# Patient Record
Sex: Female | Born: 1970 | Race: White | Hispanic: No | Smoking: Former smoker
Health system: Southern US, Community
[De-identification: ages and names within clinical notes are randomized; demographics above are authoritative.]

## PROBLEM LIST (undated history)

## (undated) DIAGNOSIS — R519 Headache, unspecified: Secondary | ICD-10-CM

## (undated) DIAGNOSIS — B182 Chronic viral hepatitis C: Secondary | ICD-10-CM

## (undated) DIAGNOSIS — E611 Iron deficiency: Secondary | ICD-10-CM

## (undated) DIAGNOSIS — Z9889 Other specified postprocedural states: Secondary | ICD-10-CM

## (undated) DIAGNOSIS — R51 Headache: Secondary | ICD-10-CM

## (undated) DIAGNOSIS — M1990 Unspecified osteoarthritis, unspecified site: Secondary | ICD-10-CM

## (undated) DIAGNOSIS — F329 Major depressive disorder, single episode, unspecified: Secondary | ICD-10-CM

## (undated) DIAGNOSIS — I10 Essential (primary) hypertension: Secondary | ICD-10-CM

## (undated) DIAGNOSIS — C50919 Malignant neoplasm of unspecified site of unspecified female breast: Secondary | ICD-10-CM

## (undated) DIAGNOSIS — K55069 Acute infarction of intestine, part and extent unspecified: Secondary | ICD-10-CM

## (undated) DIAGNOSIS — D250 Submucous leiomyoma of uterus: Principal | ICD-10-CM

## (undated) DIAGNOSIS — R112 Nausea with vomiting, unspecified: Secondary | ICD-10-CM

## (undated) DIAGNOSIS — Z87442 Personal history of urinary calculi: Secondary | ICD-10-CM

## (undated) DIAGNOSIS — G2581 Restless legs syndrome: Secondary | ICD-10-CM

## (undated) DIAGNOSIS — F419 Anxiety disorder, unspecified: Secondary | ICD-10-CM

## (undated) HISTORY — DX: Essential (primary) hypertension: I10

## (undated) HISTORY — DX: Other specified postprocedural states: Z98.890

## (undated) HISTORY — PX: BLADDER REPAIR: SHX76

## (undated) HISTORY — DX: Major depressive disorder, single episode, unspecified: F32.9

## (undated) HISTORY — PX: KIDNEY CYST REMOVAL: SHX684

## (undated) HISTORY — DX: Submucous leiomyoma of uterus: D25.0

## (undated) HISTORY — DX: Malignant neoplasm of unspecified site of unspecified female breast: C50.919

## (undated) HISTORY — DX: Chronic viral hepatitis C: B18.2

## (undated) HISTORY — PX: CARDIAC CATHETERIZATION: SHX172

## (undated) HISTORY — PX: ROTATOR CUFF REPAIR: SHX139

---
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
## 1996-05-27 HISTORY — PX: TUBAL LIGATION: SHX77

## 2011-05-28 DIAGNOSIS — F32A Depression, unspecified: Secondary | ICD-10-CM

## 2011-05-28 HISTORY — DX: Depression, unspecified: F32.A

## 2012-05-27 DIAGNOSIS — Z9889 Other specified postprocedural states: Secondary | ICD-10-CM

## 2012-05-27 HISTORY — DX: Other specified postprocedural states: Z98.890

## 2014-06-27 DIAGNOSIS — B182 Chronic viral hepatitis C: Secondary | ICD-10-CM

## 2014-06-27 HISTORY — DX: Chronic viral hepatitis C: B18.2

## 2015-06-29 ENCOUNTER — Ambulatory Visit (INDEPENDENT_AMBULATORY_CARE_PROVIDER_SITE_OTHER): Payer: Managed Care, Other (non HMO) | Admitting: Family Medicine

## 2015-06-29 ENCOUNTER — Encounter: Payer: Self-pay | Admitting: Family Medicine

## 2015-06-29 VITALS — BP 136/90 | HR 88 | Temp 98.1°F | Resp 20 | Ht 69.0 in | Wt 254.0 lb

## 2015-06-29 DIAGNOSIS — G2581 Restless legs syndrome: Secondary | ICD-10-CM

## 2015-06-29 DIAGNOSIS — G4710 Hypersomnia, unspecified: Secondary | ICD-10-CM | POA: Diagnosis not present

## 2015-06-29 DIAGNOSIS — I10 Essential (primary) hypertension: Secondary | ICD-10-CM

## 2015-06-29 DIAGNOSIS — G43419 Hemiplegic migraine, intractable, without status migrainosus: Secondary | ICD-10-CM

## 2015-06-29 DIAGNOSIS — F329 Major depressive disorder, single episode, unspecified: Secondary | ICD-10-CM | POA: Diagnosis not present

## 2015-06-29 DIAGNOSIS — F32A Depression, unspecified: Secondary | ICD-10-CM

## 2015-06-29 MED ORDER — ESCITALOPRAM OXALATE 10 MG PO TABS: 10.0000 mg | ORAL_TABLET | Freq: Every day | ORAL | Status: DC

## 2015-06-29 MED ORDER — ATENOLOL 100 MG PO TABS: 100.0000 mg | ORAL_TABLET | Freq: Every day | ORAL | Status: DC

## 2015-06-29 MED ORDER — GABAPENTIN 300 MG PO CAPS: 300.0000 mg | ORAL_CAPSULE | Freq: Three times a day (TID) | ORAL | Status: DC

## 2015-06-29 MED ORDER — IBUPROFEN 800 MG PO TABS: 800.0000 mg | ORAL_TABLET | Freq: Three times a day (TID) | ORAL | Status: DC | PRN

## 2015-06-29 MED ORDER — QUETIAPINE FUMARATE 50 MG PO TABS: 50.0000 mg | ORAL_TABLET | Freq: Every day | ORAL | Status: DC

## 2015-06-29 MED ORDER — LISINOPRIL 10 MG PO TABS: 10.0000 mg | ORAL_TABLET | Freq: Every day | ORAL | Status: DC

## 2015-06-29 NOTE — Progress Notes (Signed)
Subjective:    Patient ID: Susan Benton, female    DOB: November 05, 1970, 45 y.o.   MRN: [id number]  HPI Patient is a 45 year old white female here today to establish care. In the past, she suffered a back injury. At that exact same time she was dealing with the death of both of her grandparents her brother and one of her parents. 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 However she continues to battle depression. She also battles restless leg syndrome. Her primary care doctor in [place] start her on Soquel 300 mg at night. This has helped somewhat with restless leg but it did help with depression. Unfortunately she gained weight on the medication. She states that she has gained almost 50 pounds over the last few years. She also states that the Seroquel is not helping sufficiently with restless leg syndrome. She also reports that she snores excessively loud. It is to such an extent that she has to sleep in another room. Her husband has her stop sleeping at night. She reports excessive daytime sleepiness. She can fall asleep easily after eating a meal. She does not fall sleep in a car. She also reports severe daily headaches which she thought were migraines. She is concerned that she may have sleep apnea. Past medical history is also significant for hypertension and hepatitis C. She was recently treated for hepatitis C in [place]. She completed 8 weeks of Harvoni and is due for the eight-month follow-up recheck of viral load in March. Past Medical History  Diagnosis Date  . Hypertension   . Hep C w/o coma, chronic ([place]) 06/27/2014  . Hx of right and left heart catheterization 2014   Past Surgical History  Procedure Laterality Date  . Tubal ligation      2000  . Tonsilectomy, adenoidectomy, bilateral myringotomy and tubes Bilateral 1984   No current outpatient prescriptions on file prior to visit.   No current facility-administered  medications on file prior to visit.   No Known Allergies Social History   Social History  . Marital Status: Married    Spouse Name: N/A  . Number of Children: N/A  . Years of Education: N/A   Occupational History  . Not on file.   Social History Main Topics  . Smoking status: Current Every Day Smoker -- 0.50 packs/day  . Smokeless tobacco: Not on file  . Alcohol Use: Not on file  . Drug Use: Not on file  . Sexual Activity: Yes    Birth Control/ Protection: Surgical   Other Topics Concern  . Not on file   Social History Narrative  . No narrative on file      Review of Systems  All other systems reviewed and are negative.      Objective:   Physical Exam  Constitutional: She appears well-developed and well-nourished.  HENT:  Nose: Nose normal.  Mouth/Throat: Oropharynx is clear and moist. No oropharyngeal exudate.  Eyes: Conjunctivae are normal.  Neck: Neck supple. No thyromegaly present.  Cardiovascular: Normal rate, regular rhythm and normal heart sounds.   No murmur heard. Pulmonary/Chest: Effort normal and breath sounds normal. No respiratory distress. She has no wheezes. She has no rales.  Abdominal: Soft. Bowel sounds are normal. She exhibits no distension.  Lymphadenopathy:    She has no cervical adenopathy.  Vitals reviewed.         Assessment & Plan:  Benign essential HTN - Plan: lisinopril (PRINIVIL,ZESTRIL) 10  MG tablet, atenolol (TENORMIN) 100 MG tablet  Restless legs syndrome - Plan: gabapentin (NEURONTIN) 300 MG capsule  Depression - Plan: QUEtiapine (SEROQUEL) 50 MG tablet, escitalopram (LEXAPRO) 10 MG tablet  Intractable hemiplegic migraine without status migrainosus - Plan: ibuprofen (ADVIL,MOTRIN) 800 MG tablet  Due to the excessive weight gain, I would like to wean the patient down off Seroquel and gradually start her on Lexapro. Begin Lexapro 10 mg by mouth daily and decrease Seroquel to 50 mg by mouth daily at bedtime. She's only  been taking 150 mg by mouth daily at bedtime anyway for the last several months. Recheck here in March and if she is doing well discontinue Seroquel altogether. I will start her on gabapentin 300 mg by mouth every 8 hours when necessary restless leg syndrome symptoms. If gabapentin is insufficient, we can try Mirapex in March. I would like to see the patient for complete physical exam in March and perform her Pap smear and her mammogram at that time. I would also like to obtain fasting lab work including a CBC, CMP, fasting lipid panel in March. I will also check a hepatitis C viral load in March. Given her hypersomnolence and daily headache and excessive snoring, I will schedule the patient for a sleep study as I believe she may have sleep apnea as an explanation for her daily headache

## 2015-07-17 ENCOUNTER — Encounter: Payer: Self-pay | Admitting: Family Medicine

## 2015-07-17 DIAGNOSIS — F32A Depression, unspecified: Secondary | ICD-10-CM | POA: Insufficient documentation

## 2015-07-17 DIAGNOSIS — I10 Essential (primary) hypertension: Secondary | ICD-10-CM | POA: Insufficient documentation

## 2015-07-17 DIAGNOSIS — F329 Major depressive disorder, single episode, unspecified: Secondary | ICD-10-CM | POA: Insufficient documentation

## 2015-07-20 ENCOUNTER — Other Ambulatory Visit: Payer: Self-pay | Admitting: Family Medicine

## 2015-07-20 ENCOUNTER — Other Ambulatory Visit (HOSPITAL_COMMUNITY): Payer: Self-pay | Admitting: Respiratory Therapy

## 2015-07-20 ENCOUNTER — Other Ambulatory Visit: Payer: Managed Care, Other (non HMO)

## 2015-07-20 DIAGNOSIS — Z8619 Personal history of other infectious and parasitic diseases: Secondary | ICD-10-CM

## 2015-07-20 DIAGNOSIS — F329 Major depressive disorder, single episode, unspecified: Secondary | ICD-10-CM

## 2015-07-20 DIAGNOSIS — F32A Depression, unspecified: Secondary | ICD-10-CM

## 2015-07-20 DIAGNOSIS — G4710 Hypersomnia, unspecified: Secondary | ICD-10-CM

## 2015-07-20 DIAGNOSIS — Z79899 Other long term (current) drug therapy: Secondary | ICD-10-CM

## 2015-07-20 DIAGNOSIS — I10 Essential (primary) hypertension: Secondary | ICD-10-CM

## 2015-07-20 DIAGNOSIS — G43419 Hemiplegic migraine, intractable, without status migrainosus: Secondary | ICD-10-CM

## 2015-07-20 DIAGNOSIS — G2581 Restless legs syndrome: Secondary | ICD-10-CM

## 2015-07-20 LAB — COMPLETE METABOLIC PANEL WITH GFR
ALT: 16 U/L (ref 6–29)
AST: 16 U/L (ref 10–35)
Albumin: 4 g/dL (ref 3.6–5.1)
Alkaline Phosphatase: 63 U/L (ref 33–115)
BILIRUBIN TOTAL: 0.4 mg/dL (ref 0.2–1.2)
BUN: 14 mg/dL (ref 7–25)
CALCIUM: 8.9 mg/dL (ref 8.6–10.2)
CHLORIDE: 104 mmol/L (ref 98–110)
CO2: 24 mmol/L (ref 20–31)
CREATININE: 0.53 mg/dL (ref 0.50–1.10)
GFR, Est Non African American: >89 mL/min (ref >=60)
Glucose, Bld: 97 mg/dL (ref 70–99)
Potassium: 4.2 mmol/L (ref 3.5–5.3)
Sodium: 137 mmol/L (ref 135–146)
TOTAL PROTEIN: 6.9 g/dL (ref 6.1–8.1)

## 2015-07-20 LAB — CBC WITH DIFFERENTIAL/PLATELET
BASOS ABS: 0 10*3/uL (ref 0.0–0.1)
BASOS PCT: 0 % (ref 0–1)
EOS ABS: 0.3 10*3/uL (ref 0.0–0.7)
Eosinophils Relative: 3 % (ref 0–5)
HCT: 36.9 % (ref 36.0–46.0)
HEMOGLOBIN: 11.9 g/dL — AB (ref 12.0–15.0)
LYMPHS ABS: 1.8 10*3/uL (ref 0.7–4.0)
Lymphocytes Relative: 21 % (ref 12–46)
MCH: 25.1 pg — AB (ref 26.0–34.0)
MCHC: 32.2 g/dL (ref 30.0–36.0)
MCV: 77.8 fL — ABNORMAL LOW (ref 78.0–100.0)
MONOS PCT: 5 % (ref 3–12)
MPV: 10.1 fL (ref 8.6–12.4)
Monocytes Absolute: 0.4 10*3/uL (ref 0.1–1.0)
NEUTROS ABS: 6.1 10*3/uL (ref 1.7–7.7)
NEUTROS PCT: 71 % (ref 43–77)
PLATELETS: 327 10*3/uL (ref 150–400)
RBC: 4.74 MIL/uL (ref 3.87–5.11)
RDW: 16.4 % — ABNORMAL HIGH (ref 11.5–15.5)
WBC: 8.6 10*3/uL (ref 4.0–10.5)

## 2015-07-20 LAB — LIPID PANEL
CHOLESTEROL: 243 mg/dL — AB (ref 125–200)
HDL: 32 mg/dL — ABNORMAL LOW (ref >=46)
LDL CALC: 151 mg/dL — AB (ref <130)
TRIGLYCERIDES: 298 mg/dL — AB (ref <150)
Total CHOL/HDL Ratio: 7.6 Ratio — ABNORMAL HIGH (ref <=5.0)
VLDL: 60 mg/dL — AB (ref <30)

## 2015-07-26 LAB — HEPATITIS C VIRAL RNA NS3 GENOTYPE: HCV NS3 Subtype: NOT DETECTED

## 2015-08-31 ENCOUNTER — Ambulatory Visit: Payer: Managed Care, Other (non HMO) | Attending: Family Medicine | Admitting: Sleep Medicine

## 2015-08-31 DIAGNOSIS — Z79899 Other long term (current) drug therapy: Secondary | ICD-10-CM | POA: Diagnosis not present

## 2015-08-31 DIAGNOSIS — I10 Essential (primary) hypertension: Secondary | ICD-10-CM

## 2015-08-31 DIAGNOSIS — G43419 Hemiplegic migraine, intractable, without status migrainosus: Secondary | ICD-10-CM

## 2015-08-31 DIAGNOSIS — G2581 Restless legs syndrome: Secondary | ICD-10-CM

## 2015-08-31 DIAGNOSIS — G4710 Hypersomnia, unspecified: Secondary | ICD-10-CM | POA: Insufficient documentation

## 2015-08-31 DIAGNOSIS — F32A Depression, unspecified: Secondary | ICD-10-CM

## 2015-08-31 DIAGNOSIS — F329 Major depressive disorder, single episode, unspecified: Secondary | ICD-10-CM

## 2015-09-20 NOTE — Sleep Study (Signed)
[place] A. Merlene Laughter, MD     [email or web address]             NOCTURNAL POLYSOMNOGRAPHY   LOCATION: [place]   Demographics Edit Patient Name: Susan Benton, Susan Benton Date: 08/31/2015 Gender: Female D.O.B: [birth year] Age (years): 35 Referring Provider: Not Available Height (inches): 69 Interpreting Physician: Phillips Odor MD, ABSM Weight (lbs): 220 RPSGT: Peak, Robert BMI: 32 MRN: [id number] Neck Size: 17.00   CLINICAL INFORMATION EditMoveRemove this section Sleep Study Type: NPSG Indication for sleep study: Depression, Restless Sleep with Limb Movments Epworth Sleepiness Score: 10 SLEEP STUDY TECHNIQUE EditMoveRemove this section As per the AASM Manual for the Scoring of Sleep and Associated Events v2.3 (April 2016) with a hypopnea requiring 4% desaturations. The channels recorded and monitored were frontal, central and occipital EEG, electrooculogram (EOG), submentalis EMG (chin), nasal and oral airflow, thoracic and abdominal wall motion, anterior tibialis EMG, snore microphone, electrocardiogram, and pulse oximetry. MEDICATIONS EditMoveRemove this section Patient's medications include: N/A. Medications self-administered by patient during sleep study : No sleep medicine administered.   Current outpatient prescriptions:  .  atenolol (TENORMIN) 100 MG tablet, Take 1 tablet (100 mg total) by mouth daily., Disp: 30 tablet, Rfl: 5 .  escitalopram (LEXAPRO) 10 MG tablet, Take 1 tablet (10 mg total) by mouth daily., Disp: 30 tablet, Rfl: 5 .  gabapentin (NEURONTIN) 300 MG capsule, Take 1 capsule (300 mg total) by mouth 3 (three) times daily., Disp: 90 capsule, Rfl: 3 .  ibuprofen (ADVIL,MOTRIN) 800 MG tablet, Take 800 mg by mouth every 8 (eight) hours as needed., Disp: , Rfl:  .  ibuprofen (ADVIL,MOTRIN) 800 MG tablet, Take 1 tablet (800 mg total) by mouth every 8 (eight) hours as needed., Disp: 30 tablet, Rfl: 0 .  lisinopril (PRINIVIL,ZESTRIL) 10 MG tablet,  Take 1 tablet (10 mg total) by mouth daily., Disp: 30 tablet, Rfl: 5 .  QUEtiapine (SEROQUEL) 300 MG tablet, Take 300 mg by mouth at bedtime. Takes 150 mg poqhs, Disp: , Rfl:  .  QUEtiapine (SEROQUEL) 50 MG tablet, Take 1 tablet (50 mg total) by mouth at bedtime., Disp: 30 tablet, Rfl: 0  SLEEP ARCHITECTURE EditMoveRemove this section The study was initiated at 9:14:56 PM and ended at 3:19:20 AM. Sleep onset time was 23.9 minutes and the sleep efficiency was 92.2%. The total sleep time was 336.0 minutes. Stage REM latency was 209.0 minutes. The patient spent 2.23% of the night in stage N1 sleep, 84.23% in stage N2 sleep, 0.15% in stage N3 and 13.39% in REM. Alpha intrusion was absent. Supine sleep was 68.75%. RESPIRATORY PARAMETERS EditMoveRemove this section The overall apnea/hypopnea index (AHI) was 2.1 per hour. There were 0 total apneas, including 0 obstructive, 0 central and 0 mixed apneas. There were 12 hypopneas and 12 RERAs. The AHI during Stage REM sleep was 2.7 per hour. AHI while supine was 2.9 per hour. The mean oxygen saturation was 94.40%. The minimum SpO2 during sleep was 89.00%. Loud snoring was noted during this study. CARDIAC DATA EditMoveRemove this section The 2 lead EKG demonstrated sinus rhythm. The mean heart rate was 67.04 beats per minute. Other EKG findings include: PVCs. LEG MOVEMENT DATA EditMoveRemove this section The total PLMS were 223 with a resulting PLMS index of 39.82. Associated arousal with leg movement index was 3.7.    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 No significant associated arousals. No significant obstructive sleep apnea occurred during this study. No significant central sleep apnea  occurred during this study. Reduced slow wave sleep.     Delano Metz, MD Diplomate, American Board of Sleep Medicine.

## 2015-09-26 ENCOUNTER — Encounter (INDEPENDENT_AMBULATORY_CARE_PROVIDER_SITE_OTHER): Payer: Self-pay

## 2015-09-26 ENCOUNTER — Encounter: Payer: Self-pay | Admitting: Family Medicine

## 2015-09-26 ENCOUNTER — Ambulatory Visit (INDEPENDENT_AMBULATORY_CARE_PROVIDER_SITE_OTHER): Payer: Managed Care, Other (non HMO) | Admitting: Family Medicine

## 2015-09-26 VITALS — BP 152/100 | HR 86 | Temp 97.7°F | Resp 18 | Ht 69.0 in | Wt 258.0 lb

## 2015-09-26 DIAGNOSIS — G2581 Restless legs syndrome: Secondary | ICD-10-CM | POA: Diagnosis not present

## 2015-09-26 MED ORDER — ROPINIROLE HCL 0.5 MG PO TABS: 1.0000 mg | ORAL_TABLET | Freq: Every day | ORAL | Status: DC

## 2015-09-26 MED ORDER — AMOXICILLIN 875 MG PO TABS: 875.0000 mg | ORAL_TABLET | Freq: Two times a day (BID) | ORAL | Status: DC

## 2015-09-26 NOTE — Progress Notes (Signed)
Subjective:    Patient ID: Susan Benton, female    DOB: Sep 28, 1970, 45 y.o.   MRN: [id number]  HPI 06/29/15 Patient is a 45 year old white female here today to establish care. In the past, she suffered a back injury. At that exact same time she was dealing with the death of both of her grandparents her brother and one of her parents. 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 However she continues to battle depression. She also battles restless leg syndrome. Her primary care doctor in [place] start her on Soquel 300 mg at night. This has helped somewhat with restless leg but it did help with depression. Unfortunately she gained weight on the medication. She states that she has gained almost 50 pounds over the last few years. She also states that the Seroquel is not helping sufficiently with restless leg syndrome. She also reports that she snores excessively loud. It is to such an extent that she has to sleep in another room. Her husband has her stop sleeping at night. She reports excessive daytime sleepiness. She can fall asleep easily after eating a meal. She does not fall sleep in a car. She also reports severe daily headaches which she thought were migraines. She is concerned that she may have sleep apnea. Past medical history is also significant for hypertension and hepatitis C. She was recently treated for hepatitis C in [place]. She completed 8 weeks of Harvoni and is due for the eight-month follow-up recheck of viral load in March.  At that time, my plan was: Due to the excessive weight gain, I would like to wean the patient down off Seroquel and gradually start her on Lexapro. Begin Lexapro 10 mg by mouth daily and decrease Seroquel to 50 mg by mouth daily at bedtime. She's only been taking 150 mg by mouth daily at bedtime anyway for the last several months. Recheck here in March and if she is doing well discontinue Seroquel  altogether. I will start her on gabapentin 300 mg by mouth every 8 hours when necessary restless leg syndrome symptoms. If gabapentin is insufficient, we can try Mirapex in March. I would like to see the patient for complete physical exam in March and perform her Pap smear and her mammogram at that time. I would also like to obtain fasting lab work including a CBC, CMP, fasting lipid panel in March. I will also check a hepatitis C viral load in March. Given her hypersomnolence and daily headache and excessive snoring, I will schedule the patient for a sleep study as I believe she may have sleep apnea as an explanation for her daily headache  09/26/15 Patient is now off Seroquel. She is taking gabapentin which deathly helped her go to sleep but she continues to experience restless leg syndrome. In fact, during her sleep study, she was found to kick and flail her legs more than 100 times. This possibly is causing her fatigue and her lack of energy rather than sleep apnea. The gabapentin has not helped with this. She also complains of a sore throat no rhinorrhea, no cough. On examination today she has marked erythema in the posterior oropharynx with tender right cervical lymphadenopathy and no evidence of any viral symptoms. Past Medical History  Diagnosis Date  . Hep C w/o coma, chronic ([place]) 06/27/2014  . Hx of right and left heart catheterization 2014  . Depression 2013  . Hypertension 1987   Past Surgical History  Procedure Laterality Date  . Tonsilectomy, adenoidectomy, bilateral myringotomy and tubes Bilateral 1984  . Tubal ligation  1998    2000   Current Outpatient Prescriptions on File Prior to Visit  Medication Sig Dispense Refill  . atenolol (TENORMIN) 100 MG tablet Take 1 tablet (100 mg total) by mouth daily. 30 tablet 5  . escitalopram (LEXAPRO) 10 MG tablet Take 1 tablet (10 mg total) by mouth daily. 30 tablet 5  . gabapentin (NEURONTIN) 300 MG capsule Take 1 capsule (300 mg total) by  mouth 3 (three) times daily. 90 capsule 3  . ibuprofen (ADVIL,MOTRIN) 800 MG tablet Take 800 mg by mouth every 8 (eight) hours as needed.    Marland Kitchen ibuprofen (ADVIL,MOTRIN) 800 MG tablet Take 1 tablet (800 mg total) by mouth every 8 (eight) hours as needed. 30 tablet 0  . lisinopril (PRINIVIL,ZESTRIL) 10 MG tablet Take 1 tablet (10 mg total) by mouth daily. 30 tablet 5  . QUEtiapine (SEROQUEL) 300 MG tablet Take 300 mg by mouth at bedtime. Takes 150 mg poqhs    . QUEtiapine (SEROQUEL) 50 MG tablet Take 1 tablet (50 mg total) by mouth at bedtime. 30 tablet 0   No current facility-administered medications on file prior to visit.   No Known Allergies Social History   Social History  . Marital Status: Married    Spouse Name: N/A  . Number of Children: N/A  . Years of Education: N/A   Occupational History  . Not on file.   Social History Main Topics  . Smoking status: Current Every Day Smoker -- 0.50 packs/day    Types: Cigarettes  . Smokeless tobacco: Not on file  . Alcohol Use: No  . Drug Use: No  . Sexual Activity: Yes    Birth Control/ Protection: Surgical   Other Topics Concern  . Not on file   Social History Narrative      Review of Systems  All other systems reviewed and are negative.      Objective:   Physical Exam  Constitutional: She appears well-developed and well-nourished.  HENT:  Nose: Nose normal.  Mouth/Throat: Posterior oropharyngeal erythema present. No oropharyngeal exudate.  Eyes: Conjunctivae are normal.  Neck: Neck supple. No thyromegaly present.  Cardiovascular: Normal rate, regular rhythm and normal heart sounds.   No murmur heard. Pulmonary/Chest: Effort normal and breath sounds normal. No respiratory distress. She has no wheezes. She has no rales.  Abdominal: Soft. Bowel sounds are normal. She exhibits no distension.  Lymphadenopathy:    She has cervical adenopathy.  Vitals reviewed.         Assessment & Plan:  RLS Discontinue  gabapentin and replace with Requip 0.5 mg by mouth daily at bedtime. Increase to 1 mg by mouth daily at bedtime in one week. I would double up to 2 mg at night in 2 weeks depending upon her response. Her throat looks like strep throat as does her symptoms given the lack of viral symptoms such as rhinorrhea and cough sneezing etc. The erythema is impressive as well as the lymphadenopathy area and begin amoxicillin 850 mg by mouth twice a day for 10 days

## 2015-10-25 ENCOUNTER — Encounter: Payer: Self-pay | Admitting: Family Medicine

## 2015-12-22 ENCOUNTER — Encounter: Payer: Self-pay | Admitting: Family Medicine

## 2015-12-22 ENCOUNTER — Ambulatory Visit (INDEPENDENT_AMBULATORY_CARE_PROVIDER_SITE_OTHER): Payer: Managed Care, Other (non HMO) | Admitting: Family Medicine

## 2015-12-22 VITALS — BP 140/90 | HR 100 | Temp 98.3°F | Resp 20 | Ht 69.0 in | Wt 251.0 lb

## 2015-12-22 DIAGNOSIS — J209 Acute bronchitis, unspecified: Secondary | ICD-10-CM | POA: Diagnosis not present

## 2015-12-22 MED ORDER — AZITHROMYCIN 250 MG PO TABS: ORAL_TABLET | ORAL | 0 refills | Status: DC

## 2015-12-22 MED ORDER — PREDNISONE 20 MG PO TABS: ORAL_TABLET | ORAL | 0 refills | Status: DC

## 2015-12-22 MED ORDER — ALBUTEROL SULFATE HFA 108 (90 BASE) MCG/ACT IN AERS: 2.0000 | INHALATION_SPRAY | Freq: Once | RESPIRATORY_TRACT | Status: DC

## 2015-12-22 NOTE — Progress Notes (Signed)
   Subjective:    Patient ID: Susan Benton, female    DOB: Dec 22, 1970, 45 y.o.   MRN: [id number]  HPI Patient has had a severe cough productive of brown sputum for 4 days. She reports subjective fevers and chills. On examination today she is audibly wheezing bilaterally with rhonchorous breath sounds. She reports tightness in her chest and chest congestion. She continues to smoke. Past Medical History:  Diagnosis Date  . Depression 2013  . Hep C w/o coma, chronic ([place]) 06/27/2014  . Hx of right and left heart catheterization 2014  . Hypertension 1987   Past Surgical History:  Procedure Laterality Date  . TONSILECTOMY, ADENOIDECTOMY, BILATERAL MYRINGOTOMY AND TUBES Bilateral 1984  . TUBAL LIGATION  1998   2000   Current Outpatient Prescriptions on File Prior to Visit  Medication Sig Dispense Refill  . atenolol (TENORMIN) 100 MG tablet Take 1 tablet (100 mg total) by mouth daily. 30 tablet 5  . escitalopram (LEXAPRO) 10 MG tablet Take 1 tablet (10 mg total) by mouth daily. 30 tablet 5  . gabapentin (NEURONTIN) 300 MG capsule Take 1 capsule (300 mg total) by mouth 3 (three) times daily. 90 capsule 3  . ibuprofen (ADVIL,MOTRIN) 800 MG tablet Take 1 tablet (800 mg total) by mouth every 8 (eight) hours as needed. 30 tablet 0  . lisinopril (PRINIVIL,ZESTRIL) 10 MG tablet Take 1 tablet (10 mg total) by mouth daily. 30 tablet 5  . rOPINIRole (REQUIP) 0.5 MG tablet Take 2 tablets (1 mg total) by mouth at bedtime. 60 tablet 5   No current facility-administered medications on file prior to visit.    No Known Allergies Social History   Social History  . Marital status: Married    Spouse name: N/A  . Number of children: N/A  . Years of education: N/A   Occupational History  . Not on file.   Social History Main Topics  . Smoking status: Current Every Day Smoker    Packs/day: 0.50    Types: Cigarettes  . Smokeless tobacco: Not on file  . Alcohol use No  . Drug use: No  . Sexual  activity: Yes    Birth control/ protection: Surgical   Other Topics Concern  . Not on file   Social History Narrative  . No narrative on file      Review of Systems  All other systems reviewed and are negative.      Objective:   Physical Exam  HENT:  Right Ear: External ear normal.  Left Ear: External ear normal.  Nose: Nose normal.  Mouth/Throat: Oropharynx is clear and moist.  Eyes: Conjunctivae are normal.  Neck: Neck supple.  Cardiovascular: Normal rate, regular rhythm and normal heart sounds.   Pulmonary/Chest: Effort normal. She has wheezes. She has rales.  Lymphadenopathy:    She has no cervical adenopathy.  Vitals reviewed.         Assessment & Plan:  Acute bronchitis, unspecified organism - Plan: azithromycin (ZITHROMAX) 250 MG tablet, albuterol (PROVENTIL HFA;VENTOLIN HFA) 108 (90 Base) MCG/ACT inhaler 2 puff, predniSONE (DELTASONE) 20 MG tablet  Albuterol 2 puffs inhaled every 6 hours as needed. Begin Z-Pak. Start prednisone taper pack. Encourage smoking cessation. She may have underlying mild COPD as evidenced by her frequent steroid dependent episodes of bronchitis

## 2016-01-03 ENCOUNTER — Encounter: Payer: Self-pay | Admitting: Family Medicine

## 2016-02-09 ENCOUNTER — Encounter: Payer: Self-pay | Admitting: Family Medicine

## 2016-02-14 ENCOUNTER — Other Ambulatory Visit: Payer: Self-pay | Admitting: Family Medicine

## 2016-02-14 DIAGNOSIS — I10 Essential (primary) hypertension: Secondary | ICD-10-CM

## 2016-02-17 ENCOUNTER — Other Ambulatory Visit: Payer: Self-pay | Admitting: Family Medicine

## 2016-02-17 DIAGNOSIS — I10 Essential (primary) hypertension: Secondary | ICD-10-CM

## 2016-02-17 MED ORDER — ATENOLOL 100 MG PO TABS: 100.0000 mg | ORAL_TABLET | Freq: Every day | ORAL | 5 refills | Status: DC

## 2016-02-17 NOTE — Telephone Encounter (Signed)
Medication called/sent to requested pharmacy  

## 2016-02-19 ENCOUNTER — Telehealth: Payer: Self-pay | Admitting: Family Medicine

## 2016-02-19 MED ORDER — METOPROLOL SUCCINATE ER 100 MG PO TB24: 100.0000 mg | ORAL_TABLET | Freq: Every day | ORAL | 3 refills | Status: DC

## 2016-02-19 NOTE — Telephone Encounter (Signed)
Atenolol is on manufacturer back order and would like to switch to something else.   Per [facility] change to Metoprolol XL 100mg    Rx sent to pharm

## 2016-02-29 ENCOUNTER — Encounter: Payer: Self-pay | Admitting: Family Medicine

## 2016-03-06 ENCOUNTER — Other Ambulatory Visit: Payer: Self-pay | Admitting: Family Medicine

## 2016-03-06 DIAGNOSIS — I10 Essential (primary) hypertension: Secondary | ICD-10-CM

## 2016-03-06 DIAGNOSIS — F32A Depression, unspecified: Secondary | ICD-10-CM

## 2016-03-06 DIAGNOSIS — F329 Major depressive disorder, single episode, unspecified: Secondary | ICD-10-CM

## 2016-03-10 ENCOUNTER — Other Ambulatory Visit: Payer: Self-pay | Admitting: Family Medicine

## 2016-03-10 DIAGNOSIS — F32A Depression, unspecified: Secondary | ICD-10-CM

## 2016-03-10 DIAGNOSIS — F329 Major depressive disorder, single episode, unspecified: Secondary | ICD-10-CM

## 2016-03-11 ENCOUNTER — Telehealth: Payer: Self-pay | Admitting: Family Medicine

## 2016-03-11 NOTE — Telephone Encounter (Signed)
Patient is calling to say that she was here recently and dr pickard prescribed her prednisone for her bronchitis, he said when she was off of that he could prescribe her phentermine she would like this done now if possible   [phone]

## 2016-03-12 MED ORDER — PHENTERMINE HCL 37.5 MG PO TABS
37.5000 mg | ORAL_TABLET | Freq: Every day | ORAL | 0 refills | Status: DC
Start: 2016-03-12 — End: 2016-09-25

## 2016-03-12 NOTE — Telephone Encounter (Signed)
Medication called/sent to requested pharmacy and pt will call back to schedule appt when she gets her schedule.

## 2016-03-12 NOTE — Telephone Encounter (Signed)
We can try adipex 37.5 mg poqam for weight loos (30) tabs, no refill, recheck in 74month at ov to monitor bp and ensure that it does not cause any mania symptoms as a stimulant.

## 2016-03-25 ENCOUNTER — Ambulatory Visit (INDEPENDENT_AMBULATORY_CARE_PROVIDER_SITE_OTHER): Payer: Managed Care, Other (non HMO) | Admitting: Family Medicine

## 2016-03-25 ENCOUNTER — Encounter: Payer: Self-pay | Admitting: Family Medicine

## 2016-03-25 VITALS — BP 140/98 | HR 78 | Temp 98.3°F | Resp 16 | Ht 69.0 in | Wt 254.0 lb

## 2016-03-25 DIAGNOSIS — R350 Frequency of micturition: Secondary | ICD-10-CM

## 2016-03-25 LAB — URINALYSIS, ROUTINE W REFLEX MICROSCOPIC
Bilirubin Urine: NEGATIVE
Glucose, UA: NEGATIVE
KETONES UR: NEGATIVE
LEUKOCYTES UA: NEGATIVE
NITRITE: POSITIVE — AB
PH: 5.5 (ref 5.0–8.0)
Protein, ur: NEGATIVE
SPECIFIC GRAVITY, URINE: 1.02 (ref 1.001–1.035)

## 2016-03-25 LAB — URINALYSIS, MICROSCOPIC ONLY
BACTERIA UA: NONE SEEN [HPF]
CASTS: NONE SEEN [LPF]
CRYSTALS: NONE SEEN [HPF]
Yeast: NONE SEEN [HPF]

## 2016-03-25 MED ORDER — SULFAMETHOXAZOLE-TRIMETHOPRIM 800-160 MG PO TABS: 1.0000 | ORAL_TABLET | Freq: Two times a day (BID) | ORAL | 0 refills | Status: DC

## 2016-03-25 MED ORDER — FLUCONAZOLE 150 MG PO TABS: 150.0000 mg | ORAL_TABLET | Freq: Once | ORAL | 0 refills | Status: AC

## 2016-03-25 NOTE — Progress Notes (Signed)
   Subjective:    Patient ID: Susan Benton, female    DOB: 1970-10-28, 45 y.o.   MRN: [id number]  HPI Patient presents with 2 days of polyuria 8-10 episodes per day, trace hematuria, urgency, frequency, bladder spasms, and some mild dysuria. She is on Pyridium explaining some of the color changes seen on her urinalysis this morning Past Medical History:  Diagnosis Date  . Depression 2013  . Hep C w/o coma, chronic ([place]) 06/27/2014  . Hx of right and left heart catheterization 2014  . Hypertension 1987   Past Surgical History:  Procedure Laterality Date  . TONSILECTOMY, ADENOIDECTOMY, BILATERAL MYRINGOTOMY AND TUBES Bilateral 1984  . [place]   Current Outpatient Prescriptions on File Prior to Visit  Medication Sig Dispense Refill  . escitalopram (LEXAPRO) 10 MG tablet TAKE 1 TABLET(10 MG) BY MOUTH DAILY 30 tablet 11  . gabapentin (NEURONTIN) 300 MG capsule Take 1 capsule (300 mg total) by mouth 3 (three) times daily. 90 capsule 3  . ibuprofen (ADVIL,MOTRIN) 800 MG tablet Take 1 tablet (800 mg total) by mouth every 8 (eight) hours as needed. 30 tablet 0  . lisinopril (PRINIVIL,ZESTRIL) 10 MG tablet TAKE 1 TABLET(10 MG) BY MOUTH DAILY 30 tablet 0  . metoprolol succinate (TOPROL-XL) 100 MG 24 hr tablet Take 1 tablet (100 mg total) by mouth daily. Take with or immediately following a meal. 90 tablet 3  . phentermine (ADIPEX-P) 37.5 MG tablet Take 1 tablet (37.5 mg total) by mouth daily before breakfast. 30 tablet 0  . rOPINIRole (REQUIP) 0.5 MG tablet Take 2 tablets (1 mg total) by mouth at bedtime. 60 tablet 5   Current Facility-Administered Medications on File Prior to Visit  Medication Dose Route Frequency Provider Last Rate Last Dose  . albuterol (PROVENTIL HFA;VENTOLIN HFA) 108 (90 Base) MCG/ACT inhaler 2 puff  2 puff Inhalation Once Susy Frizzle, MD       No Known Allergies Social History   Social History  . Marital status: Married    Spouse name:  N/A  . Number of children: N/A  . Years of education: N/A   Occupational History  . Not on file.   Social History Main Topics  . Smoking status: Current Every Day Smoker    Packs/day: 0.50    Types: Cigarettes  . Smokeless tobacco: Not on file  . Alcohol use No  . Drug use: No  . Sexual activity: Yes    Birth control/ protection: Surgical   Other Topics Concern  . Not on file   Social History Narrative  . No narrative on file      Review of Systems  All other systems reviewed and are negative.      Objective:   Physical Exam  Cardiovascular: Normal rate, regular rhythm and normal heart sounds.   Pulmonary/Chest: Effort normal and breath sounds normal.  Vitals reviewed.         Assessment & Plan:  Frequent urination - Plan: Urinalysis, Routine w reflex microscopic (not at [facility]), fluconazole (DIFLUCAN) 150 MG tablet  Begin Bactrim 1 tablet by mouth twice a day for 3 days. Also gave her a prescription of Diflucan in case she develops a yeast infection afterwards. We also recommended rechecking her cholesterol in January after she completes 3 months of Adipex. We also discussed Saxenda.

## 2016-04-26 ENCOUNTER — Other Ambulatory Visit: Payer: Self-pay | Admitting: Family Medicine

## 2016-04-30 ENCOUNTER — Other Ambulatory Visit: Payer: Self-pay | Admitting: Family Medicine

## 2016-04-30 DIAGNOSIS — I10 Essential (primary) hypertension: Secondary | ICD-10-CM

## 2016-04-30 DIAGNOSIS — G2581 Restless legs syndrome: Secondary | ICD-10-CM

## 2016-05-23 ENCOUNTER — Other Ambulatory Visit: Payer: Self-pay | Admitting: Family Medicine

## 2016-06-17 ENCOUNTER — Ambulatory Visit: Payer: Managed Care, Other (non HMO) | Admitting: Family Medicine

## 2016-07-10 ENCOUNTER — Other Ambulatory Visit: Payer: Self-pay | Admitting: Family Medicine

## 2016-07-10 DIAGNOSIS — I10 Essential (primary) hypertension: Secondary | ICD-10-CM

## 2016-07-11 ENCOUNTER — Other Ambulatory Visit: Payer: Self-pay | Admitting: Family Medicine

## 2016-07-11 DIAGNOSIS — I10 Essential (primary) hypertension: Secondary | ICD-10-CM

## 2016-07-11 MED ORDER — ATENOLOL 100 MG PO TABS: ORAL_TABLET | ORAL | 3 refills | Status: DC

## 2016-07-11 MED ORDER — ROPINIROLE HCL 2 MG PO TABS: ORAL_TABLET | ORAL | 3 refills | Status: AC

## 2016-08-06 ENCOUNTER — Encounter: Payer: Self-pay | Admitting: Family Medicine

## 2016-09-25 ENCOUNTER — Encounter: Payer: Self-pay | Admitting: Adult Health

## 2016-09-25 ENCOUNTER — Ambulatory Visit (INDEPENDENT_AMBULATORY_CARE_PROVIDER_SITE_OTHER): Payer: BLUE CROSS/BLUE SHIELD | Admitting: Adult Health

## 2016-09-25 ENCOUNTER — Other Ambulatory Visit (HOSPITAL_COMMUNITY)
Admission: RE | Admit: 2016-09-25 | Discharge: 2016-09-25 | Disposition: A | Discharge status: Home / Self Care | Payer: BLUE CROSS/BLUE SHIELD | Source: Ambulatory Visit | Attending: Adult Health | Admitting: Adult Health

## 2016-09-25 VITALS — BP 122/80 | HR 79 | Ht 69.0 in | Wt 258.0 lb

## 2016-09-25 DIAGNOSIS — N946 Dysmenorrhea, unspecified: Secondary | ICD-10-CM | POA: Insufficient documentation

## 2016-09-25 DIAGNOSIS — Z1212 Encounter for screening for malignant neoplasm of rectum: Secondary | ICD-10-CM | POA: Diagnosis not present

## 2016-09-25 DIAGNOSIS — Z1211 Encounter for screening for malignant neoplasm of colon: Secondary | ICD-10-CM

## 2016-09-25 DIAGNOSIS — Z01419 Encounter for gynecological examination (general) (routine) without abnormal findings: Secondary | ICD-10-CM | POA: Insufficient documentation

## 2016-09-25 DIAGNOSIS — N6311 Unspecified lump in the right breast, upper outer quadrant: Secondary | ICD-10-CM

## 2016-09-25 DIAGNOSIS — N920 Excessive and frequent menstruation with regular cycle: Secondary | ICD-10-CM

## 2016-09-25 LAB — HEMOCCULT GUIAC POC 1CARD (OFFICE): FECAL OCCULT BLD: NEGATIVE

## 2016-09-25 NOTE — Progress Notes (Signed)
Patient ID: Susan Benton, female   DOB: November 02, 1970, 46 y.o.   MRN: [id number] History of Present Illness: Susan Benton is a 46 year old white female, in for well woman gyn exam and pap, it has been years since last pap and gyn exam.She complains of lump in right breast and burning under nipple at times(has history of fibroadenoma, she says),and heavy periods.She says she bleeds 7 days and 4 are heavy, changes tampons and pad every 2 hours and has some cramps and lots of clots.She is having shoulder surgey in near future. She is Therapist, sports at [employer]. PCP is Dr Dennard Schaumann.   Current Medications, Allergies, Past Medical History, Past Surgical History, Family History and Social History were reviewed in [employer] record.     Review of Systems: Patient denies any headaches, hearing loss, fatigue, blurred vision, shortness of breath, chest pain, abdominal pain, problems with bowel movements, urination, or intercourse. No joint pain or mood swings. See HPI for positives.    Physical Exam:BP 122/80 (BP Location: Right Arm, Patient Position: Sitting, Cuff Size: Normal)   Pulse 79   Ht 5\' 9"  (1.753 m)   Wt 258 lb (117 kg)   LMP 07/31/2016 (Exact Date)   BMI 38.10 kg/m  General:  Well developed, well nourished, no acute distress Skin:  Warm and dry Neck:  Midline trachea, normal thyroid, good ROM, no lymphadenopathy Lungs; Clear to auscultation bilaterally Breast:  No dominant palpable mass, retraction, or nipple discharge or left, on right there is a 3 cm nodule at 11 0'clock in areola, non tender (last mammogram over 10 years ago in [place]). Cardiovascular: Regular rate and rhythm Abdomen:  Soft, non tender, no hepatosplenomegaly Pelvic:  External genitalia is normal in appearance, no lesions.  The vagina is normal in appearance. Urethra has no lesions or masses. The cervix is bulbous.Pap with HPV performed.   Uterus is felt to be normal shape, and contour,but enlarged to  about 12-14 weeks.  No adnexal masses or tenderness noted.Bladder is non tender, no masses felt. Rectal: Good sphincter tone, no polyps, or hemorrhoids felt.  Hemoccult negative. Extremities/musculoskeletal:  No swelling or varicosities noted, no clubbing or cyanosis Psych:  No mood changes, alert and cooperative,seems happy PHQ 9 score 14, she is on meds and denies any suicidal ideations.   Impression: 1. Encounter for routine gynecological examination with Papanicolaou smear of cervix   2. Menorrhagia with regular cycle   3. Menstrual cramps   4. Mass of upper outer quadrant of right breast   5. Screening for colorectal cancer      Plan: Diagnostic bilateral mammogram and right and left [place] scheduled for 5/8 at 3 pm at [facility] Return in 1 week for GYN [place] in office,will talk when results back  Physical in 1 year Pap in 3 if normal Labs with PCP

## 2016-09-25 NOTE — Addendum Note (Signed)
Addended by: Gaylyn Rong A on: 09/25/2016 10:03 AM   Modules accepted: Orders

## 2016-09-26 LAB — CYTOLOGY - PAP
DIAGNOSIS: NEGATIVE
HPV: NOT DETECTED

## 2016-10-01 ENCOUNTER — Ambulatory Visit (HOSPITAL_COMMUNITY)
Admission: RE | Admit: 2016-10-01 | Discharge: 2016-10-01 | Disposition: A | Discharge status: Home / Self Care | Payer: BLUE CROSS/BLUE SHIELD | Source: Ambulatory Visit | Attending: Adult Health | Admitting: Adult Health

## 2016-10-01 ENCOUNTER — Other Ambulatory Visit: Payer: Self-pay | Admitting: Family Medicine

## 2016-10-01 ENCOUNTER — Encounter (HOSPITAL_COMMUNITY): Payer: Self-pay

## 2016-10-01 DIAGNOSIS — N6322 Unspecified lump in the left breast, upper inner quadrant: Secondary | ICD-10-CM | POA: Diagnosis not present

## 2016-10-01 DIAGNOSIS — N6001 Solitary cyst of right breast: Secondary | ICD-10-CM | POA: Insufficient documentation

## 2016-10-01 DIAGNOSIS — N6311 Unspecified lump in the right breast, upper outer quadrant: Secondary | ICD-10-CM

## 2016-10-01 DIAGNOSIS — I10 Essential (primary) hypertension: Secondary | ICD-10-CM

## 2016-10-02 ENCOUNTER — Ambulatory Visit (INDEPENDENT_AMBULATORY_CARE_PROVIDER_SITE_OTHER): Payer: BLUE CROSS/BLUE SHIELD

## 2016-10-02 ENCOUNTER — Other Ambulatory Visit: Payer: Self-pay | Admitting: Adult Health

## 2016-10-02 DIAGNOSIS — N920 Excessive and frequent menstruation with regular cycle: Secondary | ICD-10-CM

## 2016-10-02 DIAGNOSIS — N946 Dysmenorrhea, unspecified: Secondary | ICD-10-CM

## 2016-10-02 DIAGNOSIS — N83202 Unspecified ovarian cyst, left side: Secondary | ICD-10-CM

## 2016-10-02 DIAGNOSIS — N6320 Unspecified lump in the left breast, unspecified quadrant: Secondary | ICD-10-CM

## 2016-10-02 NOTE — Progress Notes (Signed)
PELVIC US TA,TV,DOPPLER:heterogeneous anteverted uterus,1.2 x 1.2 x 1.1 cm submucosal fundal fibroid,EEC 3.6 mm,normal right ovary,4.8 x 4.5 x 4 cm left ovarian hemorrhagic cyst w/arterial and venous flow,no free fluid,no pain during ultrasound,ovaries appear mobile

## 2016-10-03 ENCOUNTER — Other Ambulatory Visit: Payer: Self-pay | Admitting: Adult Health

## 2016-10-03 DIAGNOSIS — N6310 Unspecified lump in the right breast, unspecified quadrant: Secondary | ICD-10-CM

## 2016-10-04 ENCOUNTER — Telehealth: Payer: Self-pay | Admitting: Adult Health

## 2016-10-04 ENCOUNTER — Encounter: Payer: Self-pay | Admitting: Adult Health

## 2016-10-04 DIAGNOSIS — D250 Submucous leiomyoma of uterus: Secondary | ICD-10-CM | POA: Insufficient documentation

## 2016-10-04 HISTORY — DX: Submucous leiomyoma of uterus: D25.0

## 2016-10-04 NOTE — Telephone Encounter (Signed)
Pt aware that US showed submucosal fundal fibroid and left hemorrhagic ovarian cyst, make appt with Dr Elonda Husky to discuss possible ablation or other options for bleeding, she has breast biopsy 5/15.She is aware that pap was normal with negative HPV

## 2016-10-08 ENCOUNTER — Ambulatory Visit (HOSPITAL_COMMUNITY)
Admission: RE | Admit: 2016-10-08 | Discharge: 2016-10-08 | Disposition: A | Discharge status: Home / Self Care | Payer: BLUE CROSS/BLUE SHIELD | Source: Ambulatory Visit | Attending: Adult Health | Admitting: Adult Health

## 2016-10-08 ENCOUNTER — Encounter (HOSPITAL_COMMUNITY): Payer: Self-pay

## 2016-10-08 ENCOUNTER — Other Ambulatory Visit: Payer: Self-pay | Admitting: Adult Health

## 2016-10-08 DIAGNOSIS — N6310 Unspecified lump in the right breast, unspecified quadrant: Secondary | ICD-10-CM

## 2016-10-08 DIAGNOSIS — C50411 Malignant neoplasm of upper-outer quadrant of right female breast: Secondary | ICD-10-CM | POA: Diagnosis not present

## 2016-10-08 DIAGNOSIS — N6311 Unspecified lump in the right breast, upper outer quadrant: Secondary | ICD-10-CM | POA: Diagnosis not present

## 2016-10-08 DIAGNOSIS — C50911 Malignant neoplasm of unspecified site of right female breast: Secondary | ICD-10-CM | POA: Insufficient documentation

## 2016-10-08 MED ORDER — LIDOCAINE HCL (PF) 1 % IJ SOLN
INTRAMUSCULAR | Status: AC
  Administered 2016-10-08: 10 mL
  Filled 2016-10-08: qty 10

## 2016-10-08 MED ORDER — LIDOCAINE-EPINEPHRINE (PF) 1 %-1:200000 IJ SOLN
INTRAMUSCULAR | Status: AC
  Administered 2016-10-08: 15 mL
  Filled 2016-10-08: qty 30

## 2016-10-10 ENCOUNTER — Telehealth: Payer: Self-pay | Admitting: Adult Health

## 2016-10-10 ENCOUNTER — Encounter: Payer: Self-pay | Admitting: Obstetrics & Gynecology

## 2016-10-10 ENCOUNTER — Ambulatory Visit (INDEPENDENT_AMBULATORY_CARE_PROVIDER_SITE_OTHER): Payer: BLUE CROSS/BLUE SHIELD | Admitting: Obstetrics & Gynecology

## 2016-10-10 VITALS — BP 140/110 | HR 72 | Wt 257.4 lb

## 2016-10-10 DIAGNOSIS — N920 Excessive and frequent menstruation with regular cycle: Secondary | ICD-10-CM | POA: Diagnosis not present

## 2016-10-10 DIAGNOSIS — C50911 Malignant neoplasm of unspecified site of right female breast: Secondary | ICD-10-CM | POA: Diagnosis not present

## 2016-10-10 DIAGNOSIS — N946 Dysmenorrhea, unspecified: Secondary | ICD-10-CM | POA: Diagnosis not present

## 2016-10-10 NOTE — Telephone Encounter (Signed)
Susan Benton from [facility] called with report for biopsy. Please review results before patient's appointment today, she has not been notified by Radiology yet.

## 2016-10-10 NOTE — Progress Notes (Signed)
Follow up appointment for results  Chief Complaint  Patient presents with  . Follow-up    RT breast biopsy    Blood pressure (!) 140/110, pulse 72, weight 257 lb 6.4 oz (116.8 kg), last menstrual period 10/01/2016. GYNECOLOGIC SONOGRAM   Susan Benton is a 46 y.o. G2P2 LMP 10/01/2016,she is here for a pelvic sonogram for enlarged uterus w/menorrhagia.  Uterus                      10 x 5.3 x 6.2 cm, heterogeneous anteverted uterus  Endometrium          3.6 mm, symmetrical, 1.2 x 1.2 x 1.1 cm submucosal fundal fibroid  Right ovary             2.5 x 2.6 x 2.1 cm, wnl  Left ovary                7.1 x 4.9 x 4.6 cm, 4.8 x 4.5 x 4 cm left ovarian hemorrhagic cyst w/arterial and venous flow  No free fluid  Technician Comments:  PELVIC US TA,TV,DOPPLER:heterogeneous anteverted uterus,1.2 x 1.2 x 1.1 cm submucosal fundal fibroid,EEC 3.6 mm,normal right ovary,4.8 x 4.5 x 4 cm left ovarian hemorrhagic cyst w/arterial and venous flow,no free fluid,no pain during ultrasound,ovaries appear mobile    [employer] 10/02/2016 4:31 PM  Clinical Impression and recommendations:  I have reviewed the sonogram results above, combined with the patient's current clinical course, below are my impressions and any appropriate recommendations for management based on the sonographic findings.  Uterus is normal, clinically insignificant myoma is noted with normal endometrium Right ovary is normal Left ovary with physiologic hemorrhagic corpus luteum   [name] 10/11/2016 8:21 PM      Reviewed breast biopsy results with patient and discussed treatment options Informed her of generic sort of treatment options Pt will plan to see Dr Arnoldo Morale  Also about to undergo rotater cuff surgery  Will proceed with endometrial ablation when she has her breast surgery because of her heavy painful menses    MEDS ordered this encounter: No orders of the defined types were placed in  this encounter.   Orders for this encounter: No orders of the defined types were placed in this encounter.   Impression: Menorrhagia with regular cycle  Dysmenorrhea  Malignant neoplasm of right female breast, unspecified estrogen receptor status, unspecified site of breast [facility])    Plan: Refer to Dr Arnoldo Morale Plan to coordinate her endometrial ablation with her planned breast surgery  Follow Up: Return for follow up based on planned surgical date TBA.       Face to face time:  25 minutes  Greater than 50% of the visit time was spent in counseling and coordination of care with the patient.  The summary and outline of the counseling and care coordination is summarized in the note above.   All questions were answered.  Past Medical History:  Diagnosis Date  . Arthritis   . Depression 2013  . Fibroids, submucosal 10/04/2016  . Hep C w/o coma, chronic ([place]) 06/27/2014  . Hx of right and left heart catheterization 2014  . Hypertension 1987  . 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 CARDIAC CATHETERIZATION    . DILITATION & CURRETTAGE/HYSTROSCOPY WITH NOVASURE ABLATION N/A 10/23/2016   Procedure: DILATATION & CURETTAGE/HYSTEROSCOPY WITH NOVASURE ENDOMETRIAL ABLATION;  Surgeon: Florian Buff, MD;  Location: [facility];  Service: Gynecology;  Laterality: N/A;  . MASTECTOMY MODIFIED RADICAL Right 10/23/2016   Procedure: MASTECTOMY MODIFIED RADICAL;  Surgeon: Aviva Signs, MD;  Location: [facility];  Service: General;  Laterality: Right;  . SIMPLE MASTECTOMY WITH AXILLARY SENTINEL NODE BIOPSY Left 10/23/2016   Procedure: SIMPLE MASTECTOMY;  Surgeon: Aviva Signs, MD;  Location: [facility];  Service: General;  Laterality: Left;  . TONSILECTOMY, ADENOIDECTOMY, BILATERAL MYRINGOTOMY AND TUBES Bilateral 1984  . TUBAL LIGATION  1998   2000    OB History    Gravida Para Term Preterm AB Living   2 2           SAB TAB  Ectopic Multiple Live Births                  No Known Allergies  Social History   Social History  . Marital status: Married    Spouse name: N/A  . Number of children: N/A  . Years of education: N/A   Social History Main Topics  . Smoking status: Former Smoker    Packs/day: 0.25    Years: 20.00    Types: Cigarettes    Quit date: 07/20/2016  . Smokeless tobacco: Never Used     Comment: 2 per day  . Alcohol use No  . Drug use: No  . Sexual activity: Yes    Birth control/ protection: Surgical     Comment: tubal   Other Topics Concern  . None   Social History Narrative  . None    Family History  Problem Relation Age of Onset  . Hypertension Mother   . Cancer Mother   . Diabetes Father   . Hypertension Father   . Kidney disease Father   . Hyperlipidemia Father   . Asthma Father   . COPD Father   . Heart disease Father   . HIV/AIDS Brother   . Cancer Maternal Grandmother        breast  . Breast cancer Maternal Grandmother   . Hypertension Son

## 2016-10-10 NOTE — Telephone Encounter (Signed)
Pt called stating that she would like to speak with Anderson Malta Regarding her appointment that she has today with Dr. Elonda Husky. Pt did not give much details. Please contact pt

## 2016-10-11 ENCOUNTER — Other Ambulatory Visit: Payer: Self-pay | Admitting: *Deleted

## 2016-10-11 DIAGNOSIS — C50911 Malignant neoplasm of unspecified site of right female breast: Secondary | ICD-10-CM

## 2016-10-12 DIAGNOSIS — I10 Essential (primary) hypertension: Secondary | ICD-10-CM | POA: Diagnosis not present

## 2016-10-12 DIAGNOSIS — Z87891 Personal history of nicotine dependence: Secondary | ICD-10-CM | POA: Diagnosis not present

## 2016-10-12 DIAGNOSIS — Z79899 Other long term (current) drug therapy: Secondary | ICD-10-CM | POA: Diagnosis not present

## 2016-10-12 DIAGNOSIS — F419 Anxiety disorder, unspecified: Secondary | ICD-10-CM | POA: Diagnosis not present

## 2016-10-12 DIAGNOSIS — C50919 Malignant neoplasm of unspecified site of unspecified female breast: Secondary | ICD-10-CM | POA: Diagnosis not present

## 2016-10-12 DIAGNOSIS — F064 Anxiety disorder due to known physiological condition: Secondary | ICD-10-CM | POA: Diagnosis not present

## 2016-10-14 ENCOUNTER — Encounter: Payer: Self-pay | Admitting: Adult Health

## 2016-10-14 ENCOUNTER — Telehealth: Payer: Self-pay | Admitting: Adult Health

## 2016-10-14 DIAGNOSIS — C50919 Malignant neoplasm of unspecified site of unspecified female breast: Secondary | ICD-10-CM

## 2016-10-14 HISTORY — DX: Malignant neoplasm of unspecified site of unspecified female breast: C50.919

## 2016-10-14 NOTE — Telephone Encounter (Signed)
Called pt to offer support, she took friend's xanax.25 mg for nerves, went to ER and given vistaril it did not help.Has appt with Dr Arnoldo Morale in am, ask him for rx for xanax, call me if needs anything.

## 2016-10-15 ENCOUNTER — Ambulatory Visit (INDEPENDENT_AMBULATORY_CARE_PROVIDER_SITE_OTHER): Payer: BLUE CROSS/BLUE SHIELD | Admitting: General Surgery

## 2016-10-15 ENCOUNTER — Encounter: Payer: Self-pay | Admitting: General Surgery

## 2016-10-15 VITALS — BP 161/100 | HR 82 | Temp 96.8°F | Resp 18 | Ht 69.0 in | Wt 260.0 lb

## 2016-10-15 DIAGNOSIS — C50411 Malignant neoplasm of upper-outer quadrant of right female breast: Secondary | ICD-10-CM

## 2016-10-15 MED ORDER — ONDANSETRON HCL 4 MG PO TABS: 4.0000 mg | ORAL_TABLET | Freq: Three times a day (TID) | ORAL | 2 refills | Status: DC | PRN

## 2016-10-15 MED ORDER — ALPRAZOLAM 0.5 MG PO TABS
1.0000 mg | ORAL_TABLET | Freq: Three times a day (TID) | ORAL | 1 refills | Status: DC | PRN
Start: 2016-10-15 — End: 2016-11-05

## 2016-10-15 NOTE — Progress Notes (Addendum)
Susan Benton; [id number]; 03/13/1971   HPI Patient is a 46 year old white female who was referred to [facility] care [facility] and treatment of a newly diagnosed right breast cancer. She was found on mammogram and biopsy of the upper, outer quadrant of the right breast to have invasive ductal carcinoma. She has no immediate family history of breast cancer. She has had a left breast biopsy in the past which was unremarkable. No nipple discharge has been noted.  Past Medical History:  Diagnosis Date  . Depression 2013  . Fibroids, submucosal 10/04/2016  . Hep C w/o coma, chronic ([place]) 06/27/2014  . Hx of right and left heart catheterization 2014  . Hypertension 1987  . 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 TONSILECTOMY, ADENOIDECTOMY, BILATERAL MYRINGOTOMY AND TUBES Bilateral 1984  . TUBAL LIGATION  1998   2000    Family History  Problem Relation Age of Onset  . Hypertension Mother   . Cancer Mother   . Diabetes Father   . Hypertension Father   . Kidney disease Father   . Hyperlipidemia Father   . Asthma Father   . COPD Father   . Heart disease Father   . HIV/AIDS Brother   . Cancer Maternal Grandmother        breast  . Breast cancer Maternal Grandmother   . Hypertension Son     Current Outpatient Prescriptions on File Prior to Visit  Medication Sig Dispense Refill  . atenolol (TENORMIN) 100 MG tablet TAKE 1 TABLET BY MOUTH DAILY 30 tablet 5  . escitalopram (LEXAPRO) 10 MG tablet TAKE 1 TABLET(10 MG) BY MOUTH DAILY 30 tablet 11  . gabapentin (NEURONTIN) 300 MG capsule TAKE 1 CAPSULE BY MOUTH THREE TIMES DAILY 90 capsule 5  . ibuprofen (ADVIL,MOTRIN) 800 MG tablet Take 1 tablet (800 mg total) by mouth every 8 (eight) hours as needed. 30 tablet 0  . lisinopril (PRINIVIL,ZESTRIL) 10 MG tablet TAKE 1 TABLET(10 MG) BY MOUTH DAILY 30 tablet 11  . rOPINIRole (REQUIP) 2  MG tablet 1-2 tabs po qhs 60 tablet 3   Current Facility-Administered Medications on File Prior to Visit  Medication Dose Route Frequency Provider Last Rate Last Dose  . albuterol (PROVENTIL HFA;VENTOLIN HFA) 108 (90 Base) MCG/ACT inhaler 2 puff  2 puff Inhalation Once Susy Frizzle, MD        No Known Allergies  History  Alcohol Use No    History  Smoking Status  . Current Every Day Smoker  . Packs/day: 0.25  . Types: Cigarettes  Smokeless Tobacco  . Never Used    Comment: 2 per day    Review of Systems  Constitutional: Positive for malaise/fatigue.  HENT: Negative.   Eyes: Positive for blurred vision and pain.  Respiratory: Negative.   Cardiovascular: Negative.   Gastrointestinal: Positive for nausea.  Genitourinary: Negative.   Musculoskeletal: Positive for joint pain.  Skin: Negative.   Neurological: Negative.   Endo/Heme/Allergies: Negative.   Psychiatric/Behavioral: Negative.     Objective   Vitals:   10/15/16 0922  BP: (!) 161/100  Pulse: 82  Resp: 18  Temp: (!) 96.8 F (36 C)    Physical Exam  Constitutional: She is oriented to person, place, and time and well-developed, well-nourished, and in no distress.  HENT:  Head: Normocephalic and atraumatic.  Neck: Normal range of motion. Neck supple.  Cardiovascular: Normal rate, regular  rhythm and normal heart sounds.   No murmur heard. Pulmonary/Chest: Effort normal and breath sounds normal. She has no wheezes. She has no rales.  Lymphadenopathy:    She has no cervical adenopathy.  Neurological: She is alert and oriented to person, place, and time.  Skin: Skin is warm and dry.  Vitals reviewed.  Breast examination reveals no dominant mass, nipple discharge, dimpling in the left breast. In the right breast, some bruising is noted from her biopsy site. No palpable mass was noted. No nipple discharge was noted. No dimpling is noted. The axilla is negative for palpable nodes.  Mammogram, pathology  reports reviewed.  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 She would like genetic testing in the future and I told her this could be arranged through oncology. She has also answer stated in a prophylactic left mastectomy. She would like a right modified radical mastectomy with immediate reconstruction. I told her that I could not offer that she her at [facility], but I could refer her to a Psychiatric nurse in [place] who could discuss this option with her. I have made arrangements for her to see Dr. Marla Roe, Plastic Surgery, in [place]. All questions were answered. I did write her a prescription for both Xanax and Zofran. Follow-up as needed.  Addendum: Patient has elected to forego plastic surgery consultation until after surgery. She would like to proceed with a right modified radical mastectomy with prophylactic left simple mastectomy, delaying reconstructive surgery for the future. The risks and benefits of the procedures including bleeding, infection, the possibility of a blood transfusion, and arm swelling were fully explained to the patient, who gave informed consent. She is scheduled for surgery for 10/23/2016.

## 2016-10-15 NOTE — Patient Instructions (Signed)
Total or Modified Radical Mastectomy A total mastectomy and a modified radical mastectomy are types of surgery for breast cancer. If you are having a total mastectomy (simple mastectomy), your entire breast will be removed. If you are having a modified radical mastectomy, your breast and nipple will be removed along with the lymph nodes under your arm. You may also have some of the lining over the muscle tissues under your breast removed. Let your health care provider know about:  Any allergies you have.  All medicines you are taking, including vitamins, herbs, eye drops, creams, and over-the-counter medicines.  Previous problems you or members of your family have had with the use of anesthetics.  Any blood disorders you have.  Any surgeries you have had.  Any medical conditions you have. What are the risks? Generally, this is a safe procedure. However, problems may occur, including:  Pain.  Infection.  Bleeding.  Scar tissue.  Chest numbness on the side of the surgery.  Fluid buildup under the skin flaps where your breast was removed (seroma).  Sensation of throbbing or tingling.  Stress or sadness from losing your breast. If you have the lymph nodes under your arm removed, you may have arm swelling, weakness, or numbness on the same side of your body as your surgery. What happens before the procedure?  Ask your health care provider about:  Changing or stopping your regular medicines. This is especially important if you are taking diabetes medicines or blood thinners.  Taking medicines such as aspirin and ibuprofen. These medicines can thin your blood. Do not take these medicines before your procedure if your health care provider instructs you not to.  Follow your health care provider's instructions about eating or drinking restrictions.  You may be checked for extra fluid around your lymph nodes (lymphedema).  Plan to have someone take you home after the  procedure. What happens during the procedure?  An IV tube will be inserted into one of your veins.  You will be given a medicine that makes you fall asleep (general anesthetic).  Your breast will be cleaned with a germ-killing solution (antiseptic).  A wide incision will be made around your nipple. The skin and nipple inside the incision will be removed along with all breast tissue.  If you are having a modified radical mastectomy:  The lining over your chest muscles will be removed.  The incision may be extended to reach the lymph nodes under your arm, or a second incision may be made.  The lymph nodes will be removed.  You may have a drainage tube inserted into your incision to collect fluid that builds up after surgery. This tube is connected to a suction bulb.  Your incision or incisions will be closed with stitches (sutures).  A bandage (dressing) will be placed over your breast and under your arm. The procedure may vary among health care providers and hospitals. What happens after the procedure?  You will be moved to a recovery area.  Your blood pressure, heart rate, breathing rate, and blood oxygen level will be monitored often until the medicines you were given have worn off.  You will be given pain medicine as needed.  After a while, you will be taken to a hospital room.  You will be encouraged to get up and walk as soon as you can.  Your IV tube can be removed when you are able to eat and drink.  Your drain may be removed before you go home from  the hospital, or you may be sent home with your drain and suction bulb. This information is not intended to replace advice given to you by your health care provider. Make sure you discuss any questions you have with your health care provider. Document Released: 02/05/2001 Document Revised: 01/18/2016 Document Reviewed: 01/26/2014 Elsevier Interactive Patient Education  2017 [employer].

## 2016-10-17 ENCOUNTER — Ambulatory Visit (HOSPITAL_COMMUNITY)
Admission: RE | Admit: 2016-10-17 | Discharge: 2016-10-17 | Disposition: A | Discharge status: Home / Self Care | Payer: BLUE CROSS/BLUE SHIELD | Source: Ambulatory Visit | Attending: General Surgery | Admitting: General Surgery

## 2016-10-17 ENCOUNTER — Encounter (HOSPITAL_COMMUNITY)
Admission: RE | Admit: 2016-10-17 | Discharge: 2016-10-17 | Disposition: A | Discharge status: Home / Self Care | Payer: BLUE CROSS/BLUE SHIELD | Source: Ambulatory Visit | Attending: General Surgery | Admitting: General Surgery

## 2016-10-17 ENCOUNTER — Encounter (HOSPITAL_COMMUNITY): Payer: Self-pay

## 2016-10-17 ENCOUNTER — Other Ambulatory Visit: Payer: Self-pay | Admitting: Obstetrics & Gynecology

## 2016-10-17 ENCOUNTER — Telehealth: Payer: Self-pay | Admitting: *Deleted

## 2016-10-17 DIAGNOSIS — Z01812 Encounter for preprocedural laboratory examination: Secondary | ICD-10-CM | POA: Insufficient documentation

## 2016-10-17 DIAGNOSIS — C50911 Malignant neoplasm of unspecified site of right female breast: Secondary | ICD-10-CM | POA: Diagnosis not present

## 2016-10-17 DIAGNOSIS — R05 Cough: Secondary | ICD-10-CM | POA: Diagnosis not present

## 2016-10-17 DIAGNOSIS — Z01810 Encounter for preprocedural cardiovascular examination: Secondary | ICD-10-CM | POA: Diagnosis not present

## 2016-10-17 DIAGNOSIS — R9431 Abnormal electrocardiogram [ECG] [EKG]: Secondary | ICD-10-CM | POA: Insufficient documentation

## 2016-10-17 HISTORY — DX: Unspecified osteoarthritis, unspecified site: M19.90

## 2016-10-17 LAB — CBC WITH DIFFERENTIAL/PLATELET
Basophils Absolute: 0 10*3/uL (ref 0.0–0.1)
Basophils Relative: 0 %
EOS PCT: 3 %
Eosinophils Absolute: 0.4 10*3/uL (ref 0.0–0.7)
HCT: 40.1 % (ref 36.0–46.0)
Hemoglobin: 13.3 g/dL (ref 12.0–15.0)
LYMPHS ABS: 2 10*3/uL (ref 0.7–4.0)
LYMPHS PCT: 17 %
MCH: 27.1 pg (ref 26.0–34.0)
MCHC: 33.2 g/dL (ref 30.0–36.0)
MCV: 81.7 fL (ref 78.0–100.0)
MONO ABS: 0.6 10*3/uL (ref 0.1–1.0)
Monocytes Relative: 5 %
Neutro Abs: 9.3 10*3/uL — ABNORMAL HIGH (ref 1.7–7.7)
Neutrophils Relative %: 75 %
PLATELETS: 334 10*3/uL (ref 150–400)
RBC: 4.91 MIL/uL (ref 3.87–5.11)
RDW: 14.2 % (ref 11.5–15.5)
WBC: 12.3 10*3/uL — ABNORMAL HIGH (ref 4.0–10.5)

## 2016-10-17 LAB — COMPREHENSIVE METABOLIC PANEL
ALT: 28 U/L (ref 14–54)
ANION GAP: 11 (ref 5–15)
AST: 26 U/L (ref 15–41)
Albumin: 3.8 g/dL (ref 3.5–5.0)
Alkaline Phosphatase: 65 U/L (ref 38–126)
BUN: 11 mg/dL (ref 6–20)
CHLORIDE: 100 mmol/L — AB (ref 101–111)
CO2: 23 mmol/L (ref 22–32)
Calcium: 9.5 mg/dL (ref 8.9–10.3)
Creatinine, Ser: 0.57 mg/dL (ref 0.44–1.00)
Glucose, Bld: 94 mg/dL (ref 65–99)
Potassium: 4.4 mmol/L (ref 3.5–5.1)
Sodium: 134 mmol/L — ABNORMAL LOW (ref 135–145)
Total Bilirubin: 0.3 mg/dL (ref 0.3–1.2)
Total Protein: 7.3 g/dL (ref 6.5–8.1)

## 2016-10-17 LAB — SURGICAL PCR SCREEN
MRSA, PCR: NEGATIVE
Staphylococcus aureus: NEGATIVE

## 2016-10-17 LAB — HCG, SERUM, QUALITATIVE: PREG SERUM: NEGATIVE

## 2016-10-17 NOTE — Patient Instructions (Signed)
Susan Benton  10/17/2016     @PREFPERIOPPHARMACY @   Your procedure is scheduled on  10/23/2016   Report to [facility] at  75  A.M.  Call this number if you have problems the morning of surgery:  [phone]   Remember:  Do not eat food or drink liquids after midnight.  Take these medicines the morning of surgery with A SIP OF WATER  Xanax, atenolol, lexapro, neurontin, lisinopril, zofran. Use your inhaler before you come and bring your rescue inhaler with you.   Do not wear jewelry, make-up or nail polish.  Do not wear lotions, powders, or perfumes, or deoderant.  Do not shave 48 hours prior to surgery.  Men may shave face and neck.  Do not bring valuables to the hospital.  [facility] is not responsible for any belongings or valuables.  Contacts, dentures or bridgework may not be worn into surgery.  Leave your suitcase in the car.  After surgery it may be brought to your room.  For patients admitted to the hospital, discharge time will be determined by your treatment team.  Patients discharged the day of surgery will not be allowed to drive home.   Name and phone number of your driver:   family Special instructions: None  Please read over the following fact sheets that you were given. Pain Booklet, Coughing and Deep Breathing, Blood Transfusion Information, Lab Information, MRSA Information, Surgical Site Infection Prevention, Anesthesia Post-op Instructions and Care and Recovery After Surgery      Total or Modified Radical Mastectomy A total mastectomy and a modified radical mastectomy are types of surgery for breast cancer. If you are having a total mastectomy (simple mastectomy), your entire breast will be removed. If you are having a modified radical mastectomy, your breast and nipple will be removed along with the lymph nodes under your arm. You may also have some of the lining over the muscle tissues under your breast removed. Let your health care  provider know about:  Any allergies you have.  All medicines you are taking, including vitamins, herbs, eye drops, creams, and over-the-counter medicines.  Previous problems you or members of your family have had with the use of anesthetics.  Any blood disorders you have.  Any surgeries you have had.  Any medical conditions you have. What are the risks? Generally, this is a safe procedure. However, problems may occur, including:  Pain.  Infection.  Bleeding.  Scar tissue.  Chest numbness on the side of the surgery.  Fluid buildup under the skin flaps where your breast was removed (seroma).  Sensation of throbbing or tingling.  Stress or sadness from losing your breast. If you have the lymph nodes under your arm removed, you may have arm swelling, weakness, or numbness on the same side of your body as your surgery. What happens before the procedure?  Ask your health care provider about:  Changing or stopping your regular medicines. This is especially important if you are taking diabetes medicines or blood thinners.  Taking medicines such as aspirin and ibuprofen. These medicines can thin your blood. Do not take these medicines before your procedure if your health care provider instructs you not to.  Follow your health care provider's instructions about eating or drinking restrictions.  You may be checked for extra fluid around your lymph nodes (lymphedema).  Plan to have someone take you home after the procedure. What happens during the procedure?  An  IV tube will be inserted into one of your veins.  You will be given a medicine that makes you fall asleep (general anesthetic).  Your breast will be cleaned with a germ-killing solution (antiseptic).  A wide incision will be made around your nipple. The skin and nipple inside the incision will be removed along with all breast tissue.  If you are having a modified radical mastectomy:  The lining over your chest  muscles will be removed.  The incision may be extended to reach the lymph nodes under your arm, or a second incision may be made.  The lymph nodes will be removed.  You may have a drainage tube inserted into your incision to collect fluid that builds up after surgery. This tube is connected to a suction bulb.  Your incision or incisions will be closed with stitches (sutures).  A bandage (dressing) will be placed over your breast and under your arm. The procedure may vary among health care providers and hospitals. What happens after the procedure?  You will be moved to a recovery area.  Your blood pressure, heart rate, breathing rate, and blood oxygen level will be monitored often until the medicines you were given have worn off.  You will be given pain medicine as needed.  After a while, you will be taken to a hospital room.  You will be encouraged to get up and walk as soon as you can.  Your IV tube can be removed when you are able to eat and drink.  Your drain may be removed before you go home from the hospital, or you may be sent home with your drain and suction bulb. This information is not intended to replace advice given to you by your health care provider. Make sure you discuss any questions you have with your health care provider. Document Released: 02/05/2001 Document Revised: 01/18/2016 Document Reviewed: 01/26/2014 Elsevier Interactive Patient Education  2017 [employer]. Total or Modified Radical Mastectomy, Care After Refer to this sheet in the next few weeks. These instructions provide you with information about caring for yourself after your procedure. Your health care provider may also give you more specific instructions. Your treatment has been planned according to current medical practices, but problems sometimes occur. Call your health care provider if you have any problems or questions after your procedure. What can I expect after the procedure? After your  procedure, it is common to have:  Pain.  Numbness.  Stiffness in your arm or shoulder.  Feelings of stress, sadness, or depression. If the lymph nodes under your arm were removed, you may have arm swelling, weakness, or numbness on the same side of your body as your surgery. Follow these instructions at home: Incision care   There are many different ways to close and cover an incision, including stitches, skin glue, and adhesive strips. Follow your health care provider's instructions about:  Incision care.  Bandage (dressing) changes and removal.  Incision closure removal.  Check your incision area every day for signs of infection. Watch for:  Redness, swelling, or pain.  Fluid, blood, or pus.  If you were sent home with a surgical drain in place, follow your health care provider's instructions for emptying it. Bathing   Do not take baths, swim, or use a hot tub until your health care provider approves.  Take sponge baths until your health care provider says that you can start showering or bathing. Activity   Return to your normal activities as directed by your  health care provider.  Avoid strenuous exercise.  Be careful to avoid any activities that could cause an injury to your arm on the side of your surgery.  Do not lift anything that is heavier than 10 lb (4.5 kg). Avoid lifting with the arm that is on the side of your surgery.  Do not carry heavy objects on your shoulder.  After your drain is removed, you should perform exercises to keep your arm from getting stiff and swollen. Talk with your health care provider about which exercises are safe for you. General instructions   Take medicines only as directed by your health care provider.  You may eat what you usually do.  Keep your arm elevated when at rest.  Do not wear tight jewelry on your arm, wrist, or fingers on the side of your surgery.  Get checked for extra fluid around your lymph nodes (lymphedema)  as often as told by your health care provider.  If you had a modified radical mastectomy, always let your health care providers know that lymph nodes under your arm were removed. This is important information to share before you are involved in certain procedures, such as giving blood or having your blood pressure taken. Contact a health care provider if:  You have a fever.  Your pain medicine is not working.  Your arm swelling, weakness, or numbness has not improved after a few weeks.  You have new swelling in your breast or arm.  You have redness, swelling, or pain in your incision area.  You have fluid, blood, or pus coming from your incision. Get help right away if:  You have very bad pain in your breast or arm.  You have chest pain.  You have difficulty breathing. This information is not intended to replace advice given to you by your health care provider. Make sure you discuss any questions you have with your health care provider. Document Released: 01/04/2004 Document Revised: 01/18/2016 Document Reviewed: 01/26/2014 Elsevier Interactive Patient Education  2017 [place] Anesthesia, Adult General anesthesia is the use of medicines to make a person "go to sleep" (be unconscious) for a medical procedure. General anesthesia is often recommended when a procedure:  Is long.  Requires you to be still or in an unusual position.  Is major and can cause you to lose blood.  Is impossible to do without general anesthesia. The medicines used for general anesthesia are called general anesthetics. In addition to making you sleep, the medicines:  Prevent pain.  Control your blood pressure.  Relax your muscles. Tell a health care provider about:  Any allergies you have.  All medicines you are taking, including vitamins, herbs, eye drops, creams, and over-the-counter medicines.  Any problems you or family members have had with anesthetic medicines.  Types of  anesthetics you have had in the past.  Any bleeding disorders you have.  Any surgeries you have had.  Any medical conditions you have.  Any history of heart or lung conditions, such as heart failure, sleep apnea, or chronic obstructive pulmonary disease (COPD).  Whether you are pregnant or may be pregnant.  Whether you use tobacco, alcohol, marijuana, or street drugs.  Any history of Armed forces logistics/support/administrative officer.  Any history of depression or anxiety. What are the risks? Generally, this is a safe procedure. However, problems may occur, including:  Allergic reaction to anesthetics.  Lung and heart problems.  Inhaling food or liquids from your stomach into your lungs (aspiration).  Injury to nerves.  Waking up during your procedure and being unable to move (rare).  Extreme agitation or a state of mental confusion (delirium) when you wake up from the anesthetic.  Air in the bloodstream, which can lead to stroke. These problems are more likely to develop if you are having a major surgery or if you have an advanced medical condition. You can prevent some of these complications by answering all of your health care provider's questions thoroughly and by following all pre-procedure instructions. General anesthesia can cause side effects, including:  Nausea or vomiting  A sore throat from the breathing tube.  Feeling cold or shivery.  Feeling tired, washed out, or achy.  Sleepiness or drowsiness.  Confusion or agitation. What happens before the procedure? Staying hydrated  Follow instructions from your health care provider about hydration, which may include:  Up to 2 hours before the procedure - you may continue to drink clear liquids, such as water, clear fruit juice, black coffee, and plain tea. Eating and drinking restrictions  Follow instructions from your health care provider about eating and drinking, which may include:  8 hours before the procedure - stop eating heavy meals  or foods such as meat, fried foods, or fatty foods.  6 hours before the procedure - stop eating light meals or foods, such as toast or cereal.  6 hours before the procedure - stop drinking milk or drinks that contain milk.  2 hours before the procedure - stop drinking clear liquids. Medicines   Ask your health care provider about:  Changing or stopping your regular medicines. This is especially important if you are taking diabetes medicines or blood thinners.  Taking medicines such as aspirin and ibuprofen. These medicines can thin your blood. Do not take these medicines before your procedure if your health care provider instructs you not to.  Taking new dietary supplements or medicines. Do not take these during the week before your procedure unless your health care provider approves them.  If you are told to take a medicine or to continue taking a medicine on the day of the procedure, take the medicine with sips of water. General instructions    Ask if you will be going home the same day, the following day, or after a longer hospital stay.  Plan to have someone take you home.  Plan to have someone stay with you for the first 24 hours after you leave the hospital or clinic.  For 3-6 weeks before the procedure, try not to use any tobacco products, such as cigarettes, chewing tobacco, and e-cigarettes.  You may brush your teeth on the morning of the procedure, but make sure to spit out the toothpaste. What happens during the procedure?  You will be given anesthetics through a mask and through an IV tube in one of your veins.  You may receive medicine to help you relax (sedative).  As soon as you are asleep, a breathing tube may be used to help you breathe.  An anesthesia specialist will stay with you throughout the procedure. He or she will help keep you comfortable and safe by continuing to give you medicines and adjusting the amount of medicine that you get. He or she will also  watch your blood pressure, pulse, and oxygen levels to make sure that the anesthetics do not cause any problems.  If a breathing tube was used to help you breathe, it will be removed before you wake up. The procedure may vary among health care providers and hospitals.  What happens after the procedure?  You will wake up, often slowly, after the procedure is complete, usually in a recovery area.  Your blood pressure, heart rate, breathing rate, and blood oxygen level will be monitored until the medicines you were given have worn off.  You may be given medicine to help you calm down if you feel anxious or agitated.  If you will be going home the same day, your health care provider may check to make sure you can stand, drink, and urinate.  Your health care providers will treat your pain and side effects before you go home.  Do not drive for 24 hours if you received a sedative.  You may:  Feel nauseous and vomit.  Have a sore throat.  Have mental slowness.  Feel cold or shivery.  Feel sleepy.  Feel tired.  Feel sore or achy, even in parts of your body where you did not have surgery. This information is not intended to replace advice given to you by your health care provider. Make sure you discuss any questions you have with your health care provider. Document Released: 08/20/2007 Document Revised: 10/24/2015 Document Reviewed: 04/27/2015 Elsevier Interactive Patient Education  2017 [place] Anesthesia, Adult, Care After These instructions provide you with information about caring for yourself after your procedure. Your health care provider may also give you more specific instructions. Your treatment has been planned according to current medical practices, but problems sometimes occur. Call your health care provider if you have any problems or questions after your procedure. What can I expect after the procedure? After the procedure, it is common to  have:  Vomiting.  A sore throat.  Mental slowness. It is common to feel:  Nauseous.  Cold or shivery.  Sleepy.  Tired.  Sore or achy, even in parts of your body where you did not have surgery. Follow these instructions at home: For at least 24 hours after the procedure:   Do not:  Participate in activities where you could fall or become injured.  Drive.  Use heavy machinery.  Drink alcohol.  Take sleeping pills or medicines that cause drowsiness.  Make important decisions or sign legal documents.  Take care of children on your own.  Rest. Eating and drinking   If you vomit, drink water, juice, or soup when you can drink without vomiting.  Drink enough fluid to keep your urine clear or pale yellow.  Make sure you have little or no nausea before eating solid foods.  Follow the diet recommended by your health care provider. General instructions   Have a responsible adult stay with you until you are awake and alert.  Return to your normal activities as told by your health care provider. Ask your health care provider what activities are safe for you.  Take over-the-counter and prescription medicines only as told by your health care provider.  If you smoke, do not smoke without supervision.  Keep all follow-up visits as told by your health care provider. This is important. Contact a health care provider if:  You continue to have nausea or vomiting at home, and medicines are not helpful.  You cannot drink fluids or start eating again.  You cannot urinate after 8-12 hours.  You develop a skin rash.  You have fever.  You have increasing redness at the site of your procedure. Get help right away if:  You have difficulty breathing.  You have chest pain.  You have unexpected bleeding.  You feel that  you are having a life-threatening or urgent problem. This information is not intended to replace advice given to you by your health care provider. Make  sure you discuss any questions you have with your health care provider. Document Released: 08/19/2000 Document Revised: 10/16/2015 Document Reviewed: 04/27/2015 Elsevier Interactive Patient Education  2017 [employer].

## 2016-10-17 NOTE — Telephone Encounter (Signed)
Informed patient that Dr Elonda Husky stated he and Dr Arnoldo Morale would work it out. Verbalized gratitude.

## 2016-10-17 NOTE — Telephone Encounter (Signed)
Sure, dr Arnoldo Morale and I will work out the details

## 2016-10-18 ENCOUNTER — Other Ambulatory Visit: Payer: Self-pay | Admitting: Obstetrics & Gynecology

## 2016-10-18 NOTE — H&P (Signed)
Susan Benton; [id number]; 08/24/1970   HPI Patient is a 45-year-old white female who was [facility] and treatment of a newly diagnosed right breast cancer. She was found on mammogram and biopsy of the upper, outer quadrant of the right breast to have invasive ductal carcinoma. She has no immediate family history of breast cancer. She has had a left breast biopsy in the past which was unremarkable. No nipple discharge has been noted.  Past Medical History:  Diagnosis Date  . Depression 2013  . Fibroids, submucosal 10/04/2016  . Hep C w/o coma, chronic (HCC) 06/27/2014  . Hx of right and left heart catheterization 2014  . Hypertension 1987  . Invasive ductal carcinoma of breast (HCC) 10/14/2016    Past Surgical History:  Procedure Laterality Date  . TONSILECTOMY, ADENOIDECTOMY, BILATERAL MYRINGOTOMY AND TUBES Bilateral 1984  . TUBAL LIGATION  1998   2000    Family History  Problem Relation Age of Onset  . Hypertension Mother   . Cancer Mother   . Diabetes Father   . Hypertension Father   . Kidney disease Father   . Hyperlipidemia Father   . Asthma Father   . COPD Father   . Heart disease Father   . HIV/AIDS Brother   . Cancer Maternal Grandmother        breast  . Breast cancer Maternal Grandmother   . Hypertension Son     Current Outpatient Prescriptions on File Prior to Visit  Medication Sig Dispense Refill  . atenolol (TENORMIN) 100 MG tablet TAKE 1 TABLET BY MOUTH DAILY 30 tablet 5  . escitalopram (LEXAPRO) 10 MG tablet TAKE 1 TABLET(10 MG) BY MOUTH DAILY 30 tablet 11  . gabapentin (NEURONTIN) 300 MG capsule TAKE 1 CAPSULE BY MOUTH THREE TIMES DAILY 90 capsule 5  . ibuprofen (ADVIL,MOTRIN) 800 MG tablet Take 1 tablet (800 mg total) by mouth every 8 (eight) hours as needed. 30 tablet 0  . lisinopril (PRINIVIL,ZESTRIL) 10 MG tablet TAKE 1 TABLET(10 MG) BY MOUTH DAILY 30 tablet 11  . rOPINIRole (REQUIP) 2  MG tablet 1-2 tabs po qhs 60 tablet 3   Current Facility-Administered Medications on File Prior to Visit  Medication Dose Route Frequency Provider Last Rate Last Dose  . albuterol (PROVENTIL HFA;VENTOLIN HFA) 108 (90 Base) MCG/ACT inhaler 2 puff  2 puff Inhalation Once Pickard, Warren T, MD        No Known Allergies  History  Alcohol Use No    History  Smoking Status  . Current Every Day Smoker  . Packs/day: 0.25  . Types: Cigarettes  Smokeless Tobacco  . Never Used    Comment: 2 per day    Review of Systems  Constitutional: Positive for malaise/fatigue.  HENT: Negative.   Eyes: Positive for blurred vision and pain.  Respiratory: Negative.   Cardiovascular: Negative.   Gastrointestinal: Positive for nausea.  Genitourinary: Negative.   Musculoskeletal: Positive for joint pain.  Skin: Negative.   Neurological: Negative.   Endo/Heme/Allergies: Negative.   Psychiatric/Behavioral: Negative.     Objective   Vitals:   10/15/16 0922  BP: (!) 161/100  Pulse: 82  Resp: 18  Temp: (!) 96.8 F (36 C)    Physical Exam  Constitutional: She is oriented to person, place, and time and well-developed, well-nourished, and in no distress.  HENT:  Head: Normocephalic and atraumatic.  Neck: Normal range of motion. Neck supple.  Cardiovascular: Normal rate, regular   rhythm and normal heart sounds.   No murmur heard. Pulmonary/Chest: Effort normal and breath sounds normal. She has no wheezes. She has no rales.  Lymphadenopathy:    She has no cervical adenopathy.  Neurological: She is alert and oriented to person, place, and time.  Skin: Skin is warm and dry.  Vitals reviewed.  Breast examination reveals no dominant mass, nipple discharge, dimpling in the left breast. In the right breast, some bruising is noted from her biopsy site. No palpable mass was noted. No nipple discharge was noted. No dimpling is noted. The axilla is negative for palpable nodes.  Mammogram, pathology  reports reviewed.  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 She would like genetic testing in the future and I told her this could be arranged through oncology. She has also answer stated in a prophylactic left mastectomy. She would like a right modified radical mastectomy with immediate reconstruction. I told her that I could not offer that she her at [facility], but I could refer her to a plastic surgeon in [place] who could discuss this option with her. I have made arrangements for her to see Dr. Dillingham, Plastic Surgery, in [place]. All questions were answered. I did write her a prescription for both Xanax and Zofran. Follow-up as needed.  Addendum: Patient has elected to forego plastic surgery consultation until after surgery. She would like to proceed with a right modified radical mastectomy with prophylactic left simple mastectomy, delaying reconstructive surgery for the future. The risks and benefits of the procedures including bleeding, infection, the possibility of a blood transfusion, and arm swelling were fully explained to the patient, who gave informed consent. She is scheduled for surgery for 10/23/2016. 

## 2016-10-21 LAB — TYPE AND SCREEN
ABO/RH(D): AB POS
ANTIBODY SCREEN: NEGATIVE

## 2016-10-22 ENCOUNTER — Other Ambulatory Visit: Payer: Self-pay | Admitting: Obstetrics & Gynecology

## 2016-10-23 ENCOUNTER — Encounter (HOSPITAL_COMMUNITY)
Admission: RE | Disposition: A | Discharge status: Home / Self Care | Payer: Self-pay | Source: Ambulatory Visit | Attending: General Surgery

## 2016-10-23 ENCOUNTER — Inpatient Hospital Stay (HOSPITAL_COMMUNITY)
Admission: RE | Admit: 2016-10-23 | Discharge: 2016-10-24 | DRG: 583 | Disposition: A | Discharge status: Home / Self Care | Payer: BLUE CROSS/BLUE SHIELD | Source: Ambulatory Visit | Attending: General Surgery | Admitting: General Surgery

## 2016-10-23 ENCOUNTER — Inpatient Hospital Stay (HOSPITAL_COMMUNITY): Payer: BLUE CROSS/BLUE SHIELD | Admitting: Anesthesiology

## 2016-10-23 ENCOUNTER — Encounter (HOSPITAL_COMMUNITY): Payer: Self-pay | Admitting: *Deleted

## 2016-10-23 DIAGNOSIS — F329 Major depressive disorder, single episode, unspecified: Secondary | ICD-10-CM | POA: Diagnosis not present

## 2016-10-23 DIAGNOSIS — B182 Chronic viral hepatitis C: Secondary | ICD-10-CM | POA: Diagnosis not present

## 2016-10-23 DIAGNOSIS — N921 Excessive and frequent menstruation with irregular cycle: Secondary | ICD-10-CM | POA: Diagnosis not present

## 2016-10-23 DIAGNOSIS — N840 Polyp of corpus uteri: Secondary | ICD-10-CM | POA: Diagnosis not present

## 2016-10-23 DIAGNOSIS — N946 Dysmenorrhea, unspecified: Secondary | ICD-10-CM | POA: Diagnosis not present

## 2016-10-23 DIAGNOSIS — Z79899 Other long term (current) drug therapy: Secondary | ICD-10-CM

## 2016-10-23 DIAGNOSIS — I10 Essential (primary) hypertension: Secondary | ICD-10-CM | POA: Diagnosis present

## 2016-10-23 DIAGNOSIS — N920 Excessive and frequent menstruation with regular cycle: Secondary | ICD-10-CM | POA: Diagnosis not present

## 2016-10-23 DIAGNOSIS — Z9011 Acquired absence of right breast and nipple: Secondary | ICD-10-CM

## 2016-10-23 DIAGNOSIS — D242 Benign neoplasm of left breast: Secondary | ICD-10-CM | POA: Diagnosis not present

## 2016-10-23 DIAGNOSIS — Z803 Family history of malignant neoplasm of breast: Secondary | ICD-10-CM | POA: Diagnosis not present

## 2016-10-23 DIAGNOSIS — C50911 Malignant neoplasm of unspecified site of right female breast: Secondary | ICD-10-CM | POA: Diagnosis not present

## 2016-10-23 DIAGNOSIS — C50411 Malignant neoplasm of upper-outer quadrant of right female breast: Secondary | ICD-10-CM | POA: Diagnosis not present

## 2016-10-23 HISTORY — PX: MASTECTOMY MODIFIED RADICAL: SHX5962

## 2016-10-23 HISTORY — PX: SIMPLE MASTECTOMY WITH AXILLARY SENTINEL NODE BIOPSY: SHX6098

## 2016-10-23 HISTORY — PX: DILITATION & CURRETTAGE/HYSTROSCOPY WITH NOVASURE ABLATION: SHX5568

## 2016-10-23 SURGERY — DILATATION & CURETTAGE/HYSTEROSCOPY WITH ESSURE
Anesthesia: General

## 2016-10-23 SURGERY — MASTECTOMY, MODIFIED RADICAL
Anesthesia: General | Laterality: Right

## 2016-10-23 MED ORDER — ONDANSETRON HCL 4 MG/2ML IJ SOLN
INTRAMUSCULAR | Status: AC
  Filled 2016-10-23: qty 2

## 2016-10-23 MED ORDER — ATENOLOL 25 MG PO TABS: 100.0000 mg | ORAL_TABLET | Freq: Every day | ORAL | Status: DC

## 2016-10-23 MED ORDER — SODIUM CHLORIDE 0.9 % IV SOLN
INTRAVENOUS | Status: AC | PRN
  Administered 2016-10-23: 1000 mL

## 2016-10-23 MED ORDER — ENOXAPARIN SODIUM 40 MG/0.4ML ~~LOC~~ SOLN
40.0000 mg | Freq: Once | SUBCUTANEOUS | Status: AC
  Administered 2016-10-23: 40 mg via SUBCUTANEOUS
  Filled 2016-10-23: qty 0.4

## 2016-10-23 MED ORDER — MIDAZOLAM HCL 5 MG/5ML IJ SOLN
INTRAMUSCULAR | Status: DC | PRN
  Administered 2016-10-23: 2 mg via INTRAVENOUS

## 2016-10-23 MED ORDER — KETOROLAC TROMETHAMINE 30 MG/ML IJ SOLN
30.0000 mg | Freq: Once | INTRAMUSCULAR | Status: AC
  Administered 2016-10-23: 30 mg via INTRAVENOUS
  Filled 2016-10-23: qty 1

## 2016-10-23 MED ORDER — DIPHENHYDRAMINE HCL 50 MG/ML IJ SOLN: 25.0000 mg | Freq: Four times a day (QID) | INTRAMUSCULAR | Status: DC | PRN

## 2016-10-23 MED ORDER — CHLORHEXIDINE GLUCONATE CLOTH 2 % EX PADS: 6.0000 | MEDICATED_PAD | Freq: Once | CUTANEOUS | Status: DC

## 2016-10-23 MED ORDER — LIDOCAINE HCL (PF) 1 % IJ SOLN
INTRAMUSCULAR | Status: AC
  Filled 2016-10-23: qty 15

## 2016-10-23 MED ORDER — MIDAZOLAM HCL 2 MG/2ML IJ SOLN
INTRAMUSCULAR | Status: AC
  Filled 2016-10-23: qty 2

## 2016-10-23 MED ORDER — PHENYLEPHRINE 40 MCG/ML (10ML) SYRINGE FOR IV PUSH (FOR BLOOD PRESSURE SUPPORT)
PREFILLED_SYRINGE | INTRAVENOUS | Status: AC
  Filled 2016-10-23: qty 10

## 2016-10-23 MED ORDER — ONDANSETRON HCL 4 MG/2ML IJ SOLN: 4.0000 mg | Freq: Four times a day (QID) | INTRAMUSCULAR | Status: DC | PRN

## 2016-10-23 MED ORDER — HYDROMORPHONE HCL 1 MG/ML IJ SOLN
1.0000 mg | INTRAMUSCULAR | Status: DC | PRN
  Administered 2016-10-23 – 2016-10-24 (×6): 1 mg via INTRAVENOUS
  Filled 2016-10-23 (×6): qty 1

## 2016-10-23 MED ORDER — SODIUM CHLORIDE 0.9 % IJ SOLN
INTRAMUSCULAR | Status: AC
  Filled 2016-10-23: qty 10

## 2016-10-23 MED ORDER — FENTANYL CITRATE (PF) 250 MCG/5ML IJ SOLN
INTRAMUSCULAR | Status: AC
  Filled 2016-10-23: qty 5

## 2016-10-23 MED ORDER — PROPOFOL 10 MG/ML IV BOLUS
INTRAVENOUS | Status: AC
  Filled 2016-10-23: qty 20

## 2016-10-23 MED ORDER — POVIDONE-IODINE 10 % OINT PACKET
TOPICAL_OINTMENT | CUTANEOUS | Status: DC | PRN
Start: 2016-10-23 — End: 2016-10-23
  Administered 2016-10-23: 2 via TOPICAL

## 2016-10-23 MED ORDER — LORAZEPAM 2 MG/ML IJ SOLN: 1.0000 mg | INTRAMUSCULAR | Status: DC | PRN

## 2016-10-23 MED ORDER — GABAPENTIN 300 MG PO CAPS
300.0000 mg | ORAL_CAPSULE | Freq: Three times a day (TID) | ORAL | Status: DC
  Administered 2016-10-23 (×2): 300 mg via ORAL
  Filled 2016-10-23 (×2): qty 1

## 2016-10-23 MED ORDER — ACETAMINOPHEN 325 MG PO TABS: 650.0000 mg | ORAL_TABLET | Freq: Four times a day (QID) | ORAL | Status: DC | PRN

## 2016-10-23 MED ORDER — LACTATED RINGERS IV SOLN
INTRAVENOUS | Status: DC
  Administered 2016-10-23 – 2016-10-24 (×2): via INTRAVENOUS

## 2016-10-23 MED ORDER — DIPHENHYDRAMINE HCL 25 MG PO CAPS: 25.0000 mg | ORAL_CAPSULE | Freq: Four times a day (QID) | ORAL | Status: DC | PRN

## 2016-10-23 MED ORDER — PROPOFOL 10 MG/ML IV BOLUS
INTRAVENOUS | Status: DC | PRN
  Administered 2016-10-23: 40 mg via INTRAVENOUS
  Administered 2016-10-23: 160 mg via INTRAVENOUS

## 2016-10-23 MED ORDER — MIDAZOLAM HCL 2 MG/2ML IJ SOLN
1.0000 mg | Freq: Once | INTRAMUSCULAR | Status: AC
  Administered 2016-10-23: 1 mg via INTRAVENOUS

## 2016-10-23 MED ORDER — ENOXAPARIN SODIUM 40 MG/0.4ML ~~LOC~~ SOLN
40.0000 mg | SUBCUTANEOUS | Status: DC
  Administered 2016-10-24: 40 mg via SUBCUTANEOUS
  Filled 2016-10-23: qty 0.4

## 2016-10-23 MED ORDER — ACETAMINOPHEN 650 MG RE SUPP: 650.0000 mg | Freq: Four times a day (QID) | RECTAL | Status: DC | PRN

## 2016-10-23 MED ORDER — HYDROMORPHONE HCL 1 MG/ML IJ SOLN
0.2500 mg | INTRAMUSCULAR | Status: DC | PRN
Start: 2016-10-23 — End: 2016-10-23
  Administered 2016-10-23: 0.5 mg via INTRAVENOUS

## 2016-10-23 MED ORDER — 0.9 % SODIUM CHLORIDE (POUR BTL) OPTIME
TOPICAL | Status: DC | PRN
  Administered 2016-10-23: 1000 mL

## 2016-10-23 MED ORDER — LIDOCAINE HCL (CARDIAC) 20 MG/ML IV SOLN
INTRAVENOUS | Status: DC | PRN
  Administered 2016-10-23: 40 mg via INTRAVENOUS

## 2016-10-23 MED ORDER — OXYCODONE-ACETAMINOPHEN 5-325 MG PO TABS
1.0000 | ORAL_TABLET | ORAL | Status: DC | PRN
  Administered 2016-10-23: 1 via ORAL
  Filled 2016-10-23: qty 2

## 2016-10-23 MED ORDER — CEFAZOLIN SODIUM-DEXTROSE 2-4 GM/100ML-% IV SOLN
2.0000 g | INTRAVENOUS | Status: AC
  Administered 2016-10-23: 2 g via INTRAVENOUS
  Filled 2016-10-23: qty 100

## 2016-10-23 MED ORDER — HYDROMORPHONE HCL 1 MG/ML IJ SOLN
0.2500 mg | INTRAMUSCULAR | Status: DC | PRN
  Administered 2016-10-23 (×4): 0.5 mg via INTRAVENOUS
  Filled 2016-10-23 (×3): qty 1

## 2016-10-23 MED ORDER — FENTANYL CITRATE (PF) 100 MCG/2ML IJ SOLN
INTRAMUSCULAR | Status: DC | PRN
  Administered 2016-10-23: 50 ug via INTRAVENOUS
  Administered 2016-10-23: 100 ug via INTRAVENOUS
  Administered 2016-10-23 (×4): 50 ug via INTRAVENOUS
  Administered 2016-10-23: 100 ug via INTRAVENOUS
  Administered 2016-10-23: 50 ug via INTRAVENOUS

## 2016-10-23 MED ORDER — ONDANSETRON HCL 4 MG/2ML IJ SOLN
INTRAMUSCULAR | Status: DC | PRN
  Administered 2016-10-23: 4 mg via INTRAVENOUS

## 2016-10-23 MED ORDER — ONDANSETRON 4 MG PO TBDP: 4.0000 mg | ORAL_TABLET | Freq: Four times a day (QID) | ORAL | Status: DC | PRN

## 2016-10-23 MED ORDER — ALPRAZOLAM 1 MG PO TABS: 1.0000 mg | ORAL_TABLET | Freq: Three times a day (TID) | ORAL | Status: DC | PRN

## 2016-10-23 MED ORDER — LISINOPRIL 10 MG PO TABS: 10.0000 mg | ORAL_TABLET | Freq: Every day | ORAL | Status: DC

## 2016-10-23 MED ORDER — LACTATED RINGERS IV SOLN
INTRAVENOUS | Status: DC
  Administered 2016-10-23 (×2): via INTRAVENOUS

## 2016-10-23 MED ORDER — EPHEDRINE SULFATE 50 MG/ML IJ SOLN
INTRAMUSCULAR | Status: AC
  Filled 2016-10-23: qty 1

## 2016-10-23 MED ORDER — ONDANSETRON HCL 4 MG/2ML IJ SOLN
4.0000 mg | Freq: Once | INTRAMUSCULAR | Status: AC
  Administered 2016-10-23: 4 mg via INTRAVENOUS

## 2016-10-23 MED ORDER — ESCITALOPRAM OXALATE 10 MG PO TABS
10.0000 mg | ORAL_TABLET | Freq: Every day | ORAL | Status: DC
  Administered 2016-10-23: 10 mg via ORAL
  Filled 2016-10-23: qty 1

## 2016-10-23 MED ORDER — ROCURONIUM BROMIDE 100 MG/10ML IV SOLN
INTRAVENOUS | Status: DC | PRN
  Administered 2016-10-23: 10 mg via INTRAVENOUS
  Administered 2016-10-23: 40 mg via INTRAVENOUS
  Administered 2016-10-23: 10 mg via INTRAVENOUS

## 2016-10-23 MED ORDER — EPHEDRINE SULFATE 50 MG/ML IJ SOLN
INTRAMUSCULAR | Status: DC | PRN
  Administered 2016-10-23: 15 mg via INTRAVENOUS

## 2016-10-23 MED ORDER — MIDAZOLAM HCL 2 MG/2ML IJ SOLN
1.0000 mg | INTRAMUSCULAR | Status: AC
  Administered 2016-10-23 (×2): 2 mg via INTRAVENOUS
  Filled 2016-10-23: qty 2

## 2016-10-23 SURGICAL SUPPLY — 62 items
ABLATOR ENDOMETRIAL BIPOLAR (ABLATOR) ×4 IMPLANT
APPLIER CLIP 11 MED OPEN (CLIP) ×4
BAG HAMPER (MISCELLANEOUS) ×8 IMPLANT
BINDER BREAST XLRG (GAUZE/BANDAGES/DRESSINGS) ×4 IMPLANT
CLIP APPLIE 11 MED OPEN (CLIP) ×3 IMPLANT
CLOTH BEACON ORANGE TIMEOUT ST (SAFETY) ×8 IMPLANT
COVER LIGHT HANDLE STERIS (MISCELLANEOUS) ×16 IMPLANT
DECANTER SPIKE VIAL GLASS SM (MISCELLANEOUS) ×4 IMPLANT
DERMABOND ADVANCED (GAUZE/BANDAGES/DRESSINGS)
DERMABOND ADVANCED .7 DNX12 (GAUZE/BANDAGES/DRESSINGS) IMPLANT
DRAPE PROXIMA HALF (DRAPES) IMPLANT
DRAPE UTILITY W/TAPE 26X15 (DRAPES) ×4 IMPLANT
DURAPREP 26ML APPLICATOR (WOUND CARE) ×8 IMPLANT
ELECT REM PT RETURN 9FT ADLT (ELECTROSURGICAL) ×4
ELECTRODE REM PT RTRN 9FT ADLT (ELECTROSURGICAL) ×3 IMPLANT
EVACUATOR DRAINAGE 10X20 100CC (DRAIN) ×6 IMPLANT
EVACUATOR SILICONE 100CC (DRAIN) ×2
FORMALIN 10 PREFIL 120ML (MISCELLANEOUS) ×4 IMPLANT
GAUZE SPONGE 4X4 12PLY STRL (GAUZE/BANDAGES/DRESSINGS) ×4 IMPLANT
GAUZE SPONGE 4X4 16PLY XRAY LF (GAUZE/BANDAGES/DRESSINGS) ×4 IMPLANT
GAUZE SPONGE 4X4 8PLY STR LF (GAUZE/BANDAGES/DRESSINGS) ×4 IMPLANT
GLOVE BIOGEL PI IND STRL 7.0 (GLOVE) ×18 IMPLANT
GLOVE BIOGEL PI IND STRL 8 (GLOVE) ×6 IMPLANT
GLOVE BIOGEL PI INDICATOR 7.0 (GLOVE) ×6
GLOVE BIOGEL PI INDICATOR 8 (GLOVE) ×2
GLOVE ECLIPSE 8.0 STRL XLNG CF (GLOVE) ×8 IMPLANT
GLOVE SURG SS PI 7.5 STRL IVOR (GLOVE) ×8 IMPLANT
GOWN STRL REUS W/TWL LRG LVL3 (GOWN DISPOSABLE) ×16 IMPLANT
GOWN STRL REUS W/TWL XL LVL3 (GOWN DISPOSABLE) ×4 IMPLANT
INST SET HYSTEROSCOPY (KITS) ×4 IMPLANT
INST SET MINOR GENERAL (KITS) ×4 IMPLANT
IV NS 1000ML (IV SOLUTION) ×1
IV NS 1000ML BAXH (IV SOLUTION) ×3 IMPLANT
KIT ROOM TURNOVER AP CYSTO (KITS) ×4 IMPLANT
KIT ROOM TURNOVER APOR (KITS) ×4 IMPLANT
MANIFOLD NEPTUNE II (INSTRUMENTS) ×8 IMPLANT
NS IRRIG 1000ML POUR BTL (IV SOLUTION) ×8 IMPLANT
PACK BASIC III (CUSTOM PROCEDURE TRAY) ×1
PACK MINOR (CUSTOM PROCEDURE TRAY) ×4 IMPLANT
PACK SRG BSC III STRL LF ECLPS (CUSTOM PROCEDURE TRAY) ×3 IMPLANT
PAD ABD 8X10 STRL (GAUZE/BANDAGES/DRESSINGS) ×4 IMPLANT
PAD ARMBOARD 7.5X6 YLW CONV (MISCELLANEOUS) ×8 IMPLANT
PAD TELFA 3X4 1S STER (GAUZE/BANDAGES/DRESSINGS) ×4 IMPLANT
SET BASIN LINEN APH (SET/KITS/TRAYS/PACK) ×8 IMPLANT
SET IRRIG Y TYPE TUR BLADDER L (SET/KITS/TRAYS/PACK) ×4 IMPLANT
SHEET LAVH (DRAPES) ×4 IMPLANT
SPONGE DRAIN TRACH 4X4 STRL 2S (GAUZE/BANDAGES/DRESSINGS) ×4 IMPLANT
SPONGE LAP 18X18 X RAY DECT (DISPOSABLE) ×8 IMPLANT
STAPLER VISISTAT (STAPLE) ×4 IMPLANT
STRIP CLOSURE SKIN 1/4X3 (GAUZE/BANDAGES/DRESSINGS) IMPLANT
SUT ETHILON 3 0 FSL (SUTURE) ×8 IMPLANT
SUT SILK 2 0 (SUTURE) ×2
SUT SILK 2 0 SH (SUTURE) ×4 IMPLANT
SUT SILK 2-0 18XBRD TIE 12 (SUTURE) ×6 IMPLANT
SUT VIC AB 2-0 CT1 27 (SUTURE) ×9
SUT VIC AB 2-0 CT1 TAPERPNT 27 (SUTURE) ×27 IMPLANT
SUT VIC AB 3-0 SH 27 (SUTURE) ×1
SUT VIC AB 3-0 SH 27X BRD (SUTURE) ×3 IMPLANT
SUT VIC AB 4-0 PS2 27 (SUTURE) ×4 IMPLANT
SUT VICRYL AB 2 0 TIES (SUTURE) IMPLANT
TAPE PAPER 3X10 WHT MICROPORE (GAUZE/BANDAGES/DRESSINGS) ×4 IMPLANT
YANKAUER SUCT BULB TIP 10FT TU (MISCELLANEOUS) ×4 IMPLANT

## 2016-10-23 NOTE — Op Note (Signed)
Patient:  Susan Benton  DOB:  [birth year]  MRN:  [id number]   Preop Diagnosis:  Right breast carcinoma, family history of breast carcinoma  Postop Diagnosis:  Same  Procedure:  Right modified radical mastectomy, prophylactic left simple mastectomy  Surgeon:  Aviva Signs, M.D.  Anes:  Gen. endotracheal  Indications:  Patient is a 47 year old white female who was recently diagnosed with right breast cancer. She also has a family history of breast cancer. She has elected to proceed with a right modified radical mastectomy with prophylactic left simple mastectomy. The risks and benefits of the procedures including bleeding, infection, nerve injury, arm swelling, and the possibility of a blood transfusion were fully explained to the patient, who gave informed consent.  Procedure note:  The patient was placed in supine position. After induction of general endotracheal anesthesia, the breasts and axilla were prepped and draped using usual sterile technique with DuraPrep. Surgical site confirmation was performed.  I first performed a right modified radical mastectomy. An elliptical incision was made around the right nipple. A superior flap was performed to the clavicle and an inferior flap formed to the chest wall. The right breast was then removed medial to lateral off the chest wall using Bovie electrocautery. A short suture was placed superiorly and a long suture placed laterally for orientation purposes. The right breast was removed without difficulty. A right level II axillary dissections was performed. The patient had a limited amount of adipose tissue in the right axilla. No palpable lymph nodes were noted. A bleeding was controlled using clips. The right axillary contents were sent to pathology for further examination. Care was taken to avoid the thoracic and dorsal artery vein and nerve complex. In addition, the intercostal brachial nerve was spared. A #10 flat Jackson-Pratt drain was then  placed along the flap and right axilla and brought out through a separate stab wound inferior to the incision line. It was secured at the skin level using a 3-0 nylon interrupted suture. The wound was then copiously irrigated with normal saline. The subcutaneous layer was reapproximated using a 2-0 Vicryl interrupted suture. The skin was closed using staples.  Next, a left simple mastectomy was performed. An elliptical incision was made medial to lateral around the left nipple. A superior flap was then formed to the clavicle and an inferior flap formed to the chest wall. The left breast was then freed away from the chest wall using Bovie electrocautery. A short suture was placed superiorly and a long suture placed laterally for orientation purposes. Breast was sent to pathology for examination. The wound was irrigated with normal saline. A bleeding was controlled using Bovie electrocautery. A #10 flat Jackson-Pratt drain was placed along the flap and brought out through a separate stab wound inferior to the incision line. It was secured at the skin and all using 3-0 nylon suture. The subcutaneous layer was reapproximated using a 2-0 Vicryl interrupted suture. The skin was closed using staples. Betadine ointment and dry sterile dressings were applied to both incisions.  All tape and needle counts were correct at the end of the procedure. The patient was extubated in the operating room and transferred to PACU in stable condition.  Complications:  None  EBL:  100 mL  Specimen:  Right breast, right axillary contents, left breast  Drains: JP drains to both left and right breast flaps

## 2016-10-23 NOTE — Op Note (Signed)
Preoperative diagnosis: Menometrorrhagia                                        Dysmenorrhea                                          Postoperative diagnoses: Same as above   Procedure: Hysteroscopy, uterine curettage, endometrial ablation using Novasure  Surgeon: Florian Buff   Anesthesia: Laryngeal mask airway  Findings: The endometrium was normal.  There were no fibroid or other abnormalities.  Description of operation: The patient was taken to the operating room and placed in the supine position. She underwent general anesthesia using the laryngeal mask airway. She was placed in the dorsal lithotomy position and prepped and draped in the usual sterile fashion. A Graves speculum was placed and the anterior cervical lip was grasped with a single-tooth tenaculum. The cervix was dilated serially to allow passage of the hysteroscope. Diagnostic hysteroscopy was performed and was found to be normal. A vigorous uterine curettage was then performed and all tissue sent to pathology for evaluation.  I then proceeded to perform the Novasure endometrial ablation.  The cervical length was 3.0. The uterus sounded to  9.0 cm yielding a net length of 6.0 cm.  The endometrial cavity was 3.5 cm wide. The power was 132 watts.  The total time of therapy was 1 min 07 seconds. The array was evaluated after the procedure and tissue was adherent on all the dimensions of the surface, confirming fundal treatment as well.    All of the equipment worked well throughout the procedure.  The patient was awakened from anesthesia and taken to the recovery room in good stable condition all counts were correct. She received 2 g of Ancef and 30 mg of Toradol preoperatively. She will be discharged from the recovery room and followed up in the office in 1- 2 weeks.  Susan Benton H 10/23/2016 1:32 PM

## 2016-10-23 NOTE — Transfer of Care (Signed)
Immediate Anesthesia Transfer of Care Note  Patient: Susan Benton  Procedure(s) Performed: Procedure(s): MASTECTOMY MODIFIED RADICAL (Right) SIMPLE MASTECTOMY (Left) DILATATION & CURETTAGE/HYSTEROSCOPY WITH NOVASURE ENDOMETRIAL ABLATION (N/A)  Patient Location: PACU  Anesthesia Type:General  Level of Consciousness: awake, alert , oriented and patient cooperative  Airway & Oxygen Therapy: Patient Spontanous Breathing and Patient connected to nasal cannula oxygen  Post-op Assessment: Report given to RN and Post -op Vital signs reviewed and stable  Post vital signs: Reviewed and stable  Last Vitals:  Vitals:   10/23/16 1050 10/23/16 1051  BP: 106/78   Pulse:    Resp: 18 (!) 53  Temp:      Last Pain:  Vitals:   10/23/16 0957  TempSrc: Oral  PainSc: 0-No pain      Patients Stated Pain Goal: 6 (60/63/01 6010)  Complications: No apparent anesthesia complications

## 2016-10-23 NOTE — H&P (Signed)
Preoperative History and Physical  Susan Benton is a 46 y.o. G2P2 with Patient's last menstrual period was 10/01/2016 (exact date). admitted for a surgical management of her ductal carcinoma .  She also has several year history of worsening menstrual periods both in length volume an dpain Sonogram is normal  Proceed with hysteroscopy uterine curettage endometrial ablation  PMH:    Past Medical History:  Diagnosis Date  . Arthritis   . Depression 2013  . Fibroids, submucosal 10/04/2016  . Hep C w/o coma, chronic ([place]) 06/27/2014  . Hx of right and left heart catheterization 2014  . Hypertension 1987  . 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 CARDIAC CATHETERIZATION    . TONSILECTOMY, ADENOIDECTOMY, BILATERAL MYRINGOTOMY AND TUBES Bilateral 1984  . TUBAL LIGATION  1998   2000    POb/GynH:      OB History    Gravida Para Term Preterm AB Living   2 2           SAB TAB Ectopic Multiple Live Births                  SH:   Social History  Substance Use Topics  . Smoking status: Former Smoker    Packs/day: 0.25    Years: 20.00    Types: Cigarettes    Quit date: 07/20/2016  . Smokeless tobacco: Never Used     Comment: 2 per day  . Alcohol use No    FH:    Family History  Problem Relation Age of Onset  . Hypertension Mother   . Cancer Mother   . Diabetes Father   . Hypertension Father   . Kidney disease Father   . Hyperlipidemia Father   . Asthma Father   . COPD Father   . Heart disease Father   . HIV/AIDS Brother   . Cancer Maternal Grandmother        breast  . Breast cancer Maternal Grandmother   . Hypertension Son      Allergies: No Known Allergies  Medications:       Current Facility-Administered Medications:  .  ceFAZolin (ANCEF) IVPB 2g/100 mL premix, 2 g, Intravenous, On Call to OR, Aviva Signs, MD .  6 CHG cloth bath night before surgery, , , Once **AND** [START  ON 10/24/2016] 6 CHG cloth bath AM of surgery, , , Once **AND** Chlorhexidine Gluconate Cloth 2 % PADS 6 each, 6 each, Topical, Once **AND** Chlorhexidine Gluconate Cloth 2 % PADS 6 each, 6 each, Topical, Once, Aviva Signs, MD .  6 CHG cloth bath night before surgery, , , Once **AND** [START ON 10/24/2016] 6 CHG cloth bath AM of surgery, , , Once **AND** Chlorhexidine Gluconate Cloth 2 % PADS 6 each, 6 each, Topical, Once **AND** Chlorhexidine Gluconate Cloth 2 % PADS 6 each, 6 each, Topical, Once, Aviva Signs, MD .  lactated ringers infusion, , Intravenous, Continuous, Lerry Liner, MD, Last Rate: 75 mL/hr at 10/23/16 0953 .  midazolam (VERSED) injection 1-2 mg, 1-2 mg, Intravenous, Q5 min, Lerry Liner, MD, 2 mg at 10/23/16 0865  Review of Systems:   Review of Systems  Constitutional: Negative for fever, chills, weight loss, malaise/fatigue and diaphoresis.  HENT: Negative for hearing loss, ear pain, nosebleeds, congestion, sore throat, neck pain, tinnitus and ear discharge.   Eyes: Negative for blurred vision, double vision, photophobia, pain, discharge and redness.  Respiratory:  Negative for cough, hemoptysis, sputum production, shortness of breath, wheezing and stridor.   Cardiovascular: Negative for chest pain, palpitations, orthopnea, claudication, leg swelling and PND.  Gastrointestinal: Positive for abdominal pain. Negative for heartburn, nausea, vomiting, diarrhea, constipation, blood in stool and melena.  Genitourinary: Negative for dysuria, urgency, frequency, hematuria and flank pain.  Musculoskeletal: Negative for myalgias, back pain, joint pain and falls.  Skin: Negative for itching and rash.  Neurological: Negative for dizziness, tingling, tremors, sensory change, speech change, focal weakness, seizures, loss of consciousness, weakness and headaches.  Endo/Heme/Allergies: Negative for environmental allergies and polydipsia. Does not bruise/bleed easily.   Psychiatric/Behavioral: Negative for depression, suicidal ideas, hallucinations, memory loss and substance abuse. The patient is not nervous/anxious and does not have insomnia.      PHYSICAL EXAM:  Blood pressure 110/63, pulse 78, temperature 98.4 F (36.9 C), temperature source Oral, resp. rate 18, last menstrual period 10/01/2016, SpO2 98 %.    Vitals reviewed. Constitutional: She is oriented to person, place, and time. She appears well-developed and well-nourished.  HENT:  Head: Normocephalic and atraumatic.  Right Ear: External ear normal.  Left Ear: External ear normal.  Nose: Nose normal.  Mouth/Throat: Oropharynx is clear and moist.  Eyes: Conjunctivae and EOM are normal. Pupils are equal, round, and reactive to light. Right eye exhibits no discharge. Left eye exhibits no discharge. No scleral icterus.  Neck: Normal range of motion. Neck supple. No tracheal deviation present. No thyromegaly present.  Cardiovascular: Normal rate, regular rhythm, normal heart sounds and intact distal pulses.  Exam reveals no gallop and no friction rub.   No murmur heard. Respiratory: Effort normal and breath sounds normal. No respiratory distress. She has no wheezes. She has no rales. She exhibits no tenderness.  GI: Soft. Bowel sounds are normal. She exhibits no distension and no mass. There is tenderness. There is no rebound and no guarding.  Genitourinary:       Vulva is normal without lesions Vagina is pink moist without discharge Cervix normal in appearance and pap is normal Uterus is normal size, contour, position, consistency, mobility, non-tender Adnexa is negative with normal sized ovaries by sonogram  Musculoskeletal: Normal range of motion. She exhibits no edema and no tenderness.  Neurological: She is alert and oriented to person, place, and time. She has normal reflexes. She displays normal reflexes. No cranial nerve deficit. She exhibits normal muscle tone. Coordination normal.   Skin: Skin is warm and dry. No rash noted. No erythema. No pallor.  Psychiatric: She has a normal mood and affect. Her behavior is normal. Judgment and thought content normal.    Labs: Results for orders placed or performed during the hospital encounter of 10/17/16 (from the past 336 hour(s))  CBC WITH DIFFERENTIAL   Collection Time: 10/17/16 11:04 AM  Result Value Ref Range   WBC 12.3 (H) 4.0 - 10.5 K/uL   RBC 4.91 3.87 - 5.11 MIL/uL   Hemoglobin 13.3 12.0 - 15.0 g/dL   HCT 40.1 36.0 - 46.0 %   MCV 81.7 78.0 - 100.0 fL   MCH 27.1 26.0 - 34.0 pg   MCHC 33.2 30.0 - 36.0 g/dL   RDW 14.2 11.5 - 15.5 %   Platelets 334 150 - 400 K/uL   Neutrophils Relative % 75 %   Neutro Abs 9.3 (H) 1.7 - 7.7 K/uL   Lymphocytes Relative 17 %   Lymphs Abs 2.0 0.7 - 4.0 K/uL   Monocytes Relative 5 %   Monocytes Absolute 0.6  0.1 - 1.0 K/uL   Eosinophils Relative 3 %   Eosinophils Absolute 0.4 0.0 - 0.7 K/uL   Basophils Relative 0 %   Basophils Absolute 0.0 0.0 - 0.1 K/uL  Comprehensive metabolic panel   Collection Time: 10/17/16 11:04 AM  Result Value Ref Range   Sodium 134 (L) 135 - 145 mmol/L   Potassium 4.4 3.5 - 5.1 mmol/L   Chloride 100 (L) 101 - 111 mmol/L   CO2 23 22 - 32 mmol/L   Glucose, Bld 94 65 - 99 mg/dL   BUN 11 6 - 20 mg/dL   Creatinine, Ser 0.57 0.44 - 1.00 mg/dL   Calcium 9.5 8.9 - 10.3 mg/dL   Total Protein 7.3 6.5 - 8.1 g/dL   Albumin 3.8 3.5 - 5.0 g/dL   AST 26 15 - 41 U/L   ALT 28 14 - 54 U/L   Alkaline Phosphatase 65 38 - 126 U/L   Total Bilirubin 0.3 0.3 - 1.2 mg/dL   GFR calc non Af Amer >60 >60 mL/min   GFR calc Af Amer >60 >60 mL/min   Anion gap 11 5 - 15  hCG, serum, qualitative   Collection Time: 10/17/16 11:04 AM  Result Value Ref Range   Preg, Serum NEGATIVE NEGATIVE  Type and screen   Collection Time: 10/17/16 11:04 AM  Result Value Ref Range   ABO/RH(D) AB POS    Antibody Screen NEG    Sample Expiration 10/31/2016    Extend sample reason NO  TRANSFUSIONS OR PREGNANCY IN THE PAST 3 MONTHS   Surgical pcr screen   Collection Time: 10/17/16 11:19 AM  Result Value Ref Range   MRSA, PCR NEGATIVE NEGATIVE   Staphylococcus aureus NEGATIVE NEGATIVE    EKG: Orders placed or performed during the hospital encounter of 10/17/16  . EKG 12-Lead  . EKG 12-Lead  . EKG 12-Lead  . EKG 12-Lead    Imaging Studies: Chest 2 View  Result Date: 10/17/2016 CLINICAL DATA:  Preop for right breast cancer, cough. EXAM: CHEST  2 VIEW COMPARISON:  None. FINDINGS: The heart size and mediastinal contours are within normal limits. Both lungs are clear. The visualized skeletal structures are unremarkable. IMPRESSION: No active cardiopulmonary disease. Electronically Signed   By: Marijo Conception, M.D.   On: 10/17/2016 14:25   US Transvaginal Non-ob  Result Date: 10/11/2016 GYNECOLOGIC SONOGRAM Avanni Doenges is a 46 y.o. G2P2 LMP 10/01/2016,she is here for a pelvic sonogram for enlarged uterus w/menorrhagia. Uterus                      10 x 5.3 x 6.2 cm, heterogeneous anteverted uterus Endometrium          3.6 mm, symmetrical, 1.2 x 1.2 x 1.1 cm submucosal fundal fibroid Right ovary             2.5 x 2.6 x 2.1 cm, wnl Left ovary                7.1 x 4.9 x 4.6 cm, 4.8 x 4.5 x 4 cm left ovarian hemorrhagic cyst w/arterial and venous flow No free fluid Technician Comments: PELVIC US TA,TV,DOPPLER:heterogeneous anteverted uterus,1.2 x 1.2 x 1.1 cm submucosal fundal fibroid,EEC 3.6 mm,normal right ovary,4.8 x 4.5 x 4 cm left ovarian hemorrhagic cyst w/arterial and venous flow,no free fluid,no pain during ultrasound,ovaries appear mobile [employer] 10/02/2016 4:31 PM Clinical Impression and recommendations: I have reviewed the sonogram results above, combined with the patient's  current clinical course, below are my impressions and any appropriate recommendations for management based on the sonographic findings. Uterus is normal, clinically insignificant myoma is noted  with normal endometrium Right ovary is normal Left ovary with physiologic hemorrhagic corpus luteum Nealy Karapetian H 10/11/2016 8:21 PM   US Pelvis Complete  Result Date: 10/11/2016 GYNECOLOGIC SONOGRAM Jaylani Nadeem is a 46 y.o. G2P2 LMP 10/01/2016,she is here for a pelvic sonogram for enlarged uterus w/menorrhagia. Uterus                      10 x 5.3 x 6.2 cm, heterogeneous anteverted uterus Endometrium          3.6 mm, symmetrical, 1.2 x 1.2 x 1.1 cm submucosal fundal fibroid Right ovary             2.5 x 2.6 x 2.1 cm, wnl Left ovary                7.1 x 4.9 x 4.6 cm, 4.8 x 4.5 x 4 cm left ovarian hemorrhagic cyst w/arterial and venous flow No free fluid Technician Comments: PELVIC US TA,TV,DOPPLER:heterogeneous anteverted uterus,1.2 x 1.2 x 1.1 cm submucosal fundal fibroid,EEC 3.6 mm,normal right ovary,4.8 x 4.5 x 4 cm left ovarian hemorrhagic cyst w/arterial and venous flow,no free fluid,no pain during ultrasound,ovaries appear mobile [employer] 10/02/2016 4:31 PM Clinical Impression and recommendations: I have reviewed the sonogram results above, combined with the patient's current clinical course, below are my impressions and any appropriate recommendations for management based on the sonographic findings. Uterus is normal, clinically insignificant myoma is noted with normal endometrium Right ovary is normal Left ovary with physiologic hemorrhagic corpus luteum Zakiya Sporrer H 10/11/2016 8:21 PM   [place] Art/ven Flow Abd Pelv Doppler Limited  Result Date: 10/11/2016 GYNECOLOGIC SONOGRAM Rakeisha Hedger is a 46 y.o. G2P2 LMP 10/01/2016,she is here for a pelvic sonogram for enlarged uterus w/menorrhagia. Uterus                      10 x 5.3 x 6.2 cm, heterogeneous anteverted uterus Endometrium          3.6 mm, symmetrical, 1.2 x 1.2 x 1.1 cm submucosal fundal fibroid Right ovary             2.5 x 2.6 x 2.1 cm, wnl Left ovary                7.1 x 4.9 x 4.6 cm, 4.8 x 4.5 x 4 cm left ovarian hemorrhagic cyst  w/arterial and venous flow No free fluid Technician Comments: PELVIC US TA,TV,DOPPLER:heterogeneous anteverted uterus,1.2 x 1.2 x 1.1 cm submucosal fundal fibroid,EEC 3.6 mm,normal right ovary,4.8 x 4.5 x 4 cm left ovarian hemorrhagic cyst w/arterial and venous flow,no free fluid,no pain during ultrasound,ovaries appear mobile [employer] 10/02/2016 4:31 PM Clinical Impression and recommendations: I have reviewed the sonogram results above, combined with the patient's current clinical course, below are my impressions and any appropriate recommendations for management based on the sonographic findings. Uterus is normal, clinically insignificant myoma is noted with normal endometrium Right ovary is normal Left ovary with physiologic hemorrhagic corpus luteum Evea Sheek H 10/11/2016 8:21 PM   US Breast Ltd Uni Left Inc Axilla  Result Date: 10/01/2016 CLINICAL DATA:  46 year old patient presents with a recent periareolar breast pain upper outer quadrant right breast. She denies any nipple discharge. She states that she has had breast biopsies in the past, but no prior imaging or  records are available. The patient's most recent mammogram was greater than 10 years ago and is not available. She recently moved to [facility]. EXAM: 2D DIGITAL DIAGNOSTIC BILATERAL MAMMOGRAM WITH CAD AND ADJUNCT TOMO ULTRASOUND BILATERAL BREAST COMPARISON:  None ACR Breast Density Category b: There are scattered areas of fibroglandular density. FINDINGS: There is an irregular mass in the far outer right breast with associated architectural distortion and a few associated microcalcifications. Approximately 2 cm deep to the mass is a group of heterogeneous microcalcifications, which are suspicious, especially given the presence of the spiculated irregular mass. There is a circumscribed lobulated mass in the lower inner quadrant of the right breast that measures approximately 13 mm. No suspicious findings are seen in the region of palpable  concern in the periareolar right breast. In the left breast is a 8 mm circumscribed mass with focal internal dystrophic calcification likely reflecting a in benign fibroadenoma. In the lower outer quadrant of the left breast is probable asymmetric fibroglandular tissue. Mammographic images were processed with CAD. On physical exam, there is a surgical scar in the upper-outer quadrant of the right breast. I do not palpate a mass in the upper outer periareolar right breast in the region of recent patient pain. No discrete mass is palpated in the 10 o'clock position of the right breast in the region of the suspicious findings on mammography. No discrete mass is palpated in the medial left breast where the circumscribed mass is seen on mammography. No mass is palpated in the left breast. Targeted ultrasound is performed, showing on the right: There is an irregular hypoechoic mass with acoustic shadowing in the 10 o'clock position of the right breast 7 cm from the nipple measuring 2.0 x 1.5 x 1.0 cm. Prominent vessel is seen adjacent to the mass. In the periareolar right breast in the region of recent patient tenderness is a cyst with internal debris measuring 1.0 x 1.0 x 0.5 cm, consistent with a benign cyst. Ultrasound the medial right breast demonstrates a circumscribed hypoechoic gently lobulated mass at 3 o'clock position 8 cm from the nipple measuring 1.4 x 0.9 x 1.0 cm. This corresponds to the mass seen on mammography and has imaging features most suggestive of a benign fibroadenoma. Ultrasound of the left breast shows a slightly hypoechoic/nearly isoechoic circumscribed oval mass at 12 o'clock position 4 cm from nipple measuring 1.0 x 0.6 x 0.7 cm, corresponding to the probable degenerating fibroadenoma seen on mammography. Ultrasound of the outer left breast shows normal fibroglandular tissue. The asymmetry seen on the mammogram is consistent with normal fibroglandular densities. Ultrasound of the right axilla  is negative for lymphadenopathy. IMPRESSION: 1. Findings suspicious for malignancy in the right breast, with an irregular spiculated mass at 10 o'clock position. Please note that there are suspicious microcalcifications within the mass and calcifications are seen 2 cm deep to the mass, which should be excised at the time of surgery if malignancy is proven. 2. 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 3. Benign cyst 9 o'clock periareolar right breast. 4. 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 RECOMMENDATION: 1. Ultrasound-guided biopsy of the suspicious right breast mass in the 10 o'clock position is recommended and has been scheduled for Oct 08, 2016. 2. The patient was given the option of follow-up ultrasound of the probable fibroadenoma in the medial right breast in 6 months for the option of ultrasound-guided biopsy. Given the suspicious findings in the 10 o'clock position of the right  breast, the patient prefers ultrasound-guided biopsy of the probable fibroadenoma in the 3 o'clock right breast. This biopsy has also been scheduled for Oct 08, 2016. 3. Recommend six-month follow-up left breast ultrasound in 6 months. I have discussed the findings and recommendations with the patient. Results were also provided in writing at the conclusion of the visit. If applicable, a reminder letter will be sent to the patient regarding the next appointment. BI-RADS CATEGORY  5: Highly suggestive of malignancy. Electronically Signed   By: Curlene Dolphin M.D.   On: 10/01/2016 16:53   US Breast Ltd Uni Right Inc Axilla  Result Date: 10/01/2016 CLINICAL DATA:  46 year old patient presents with a recent periareolar breast pain upper outer quadrant right breast. She denies any nipple discharge. She states that she has had breast biopsies in the past, but no prior imaging or records are available. The patient's most recent mammogram was greater than 10 years ago and is  not available. She recently moved to [facility]. EXAM: 2D DIGITAL DIAGNOSTIC BILATERAL MAMMOGRAM WITH CAD AND ADJUNCT TOMO ULTRASOUND BILATERAL BREAST COMPARISON:  None ACR Breast Density Category b: There are scattered areas of fibroglandular density. FINDINGS: There is an irregular mass in the far outer right breast with associated architectural distortion and a few associated microcalcifications. Approximately 2 cm deep to the mass is a group of heterogeneous microcalcifications, which are suspicious, especially given the presence of the spiculated irregular mass. There is a circumscribed lobulated mass in the lower inner quadrant of the right breast that measures approximately 13 mm. No suspicious findings are seen in the region of palpable concern in the periareolar right breast. In the left breast is a 8 mm circumscribed mass with focal internal dystrophic calcification likely reflecting a in benign fibroadenoma. In the lower outer quadrant of the left breast is probable asymmetric fibroglandular tissue. Mammographic images were processed with CAD. On physical exam, there is a surgical scar in the upper-outer quadrant of the right breast. I do not palpate a mass in the upper outer periareolar right breast in the region of recent patient pain. No discrete mass is palpated in the 10 o'clock position of the right breast in the region of the suspicious findings on mammography. No discrete mass is palpated in the medial left breast where the circumscribed mass is seen on mammography. No mass is palpated in the left breast. Targeted ultrasound is performed, showing on the right: There is an irregular hypoechoic mass with acoustic shadowing in the 10 o'clock position of the right breast 7 cm from the nipple measuring 2.0 x 1.5 x 1.0 cm. Prominent vessel is seen adjacent to the mass. In the periareolar right breast in the region of recent patient tenderness is a cyst with internal debris measuring 1.0 x 1.0 x 0.5  cm, consistent with a benign cyst. Ultrasound the medial right breast demonstrates a circumscribed hypoechoic gently lobulated mass at 3 o'clock position 8 cm from the nipple measuring 1.4 x 0.9 x 1.0 cm. This corresponds to the mass seen on mammography and has imaging features most suggestive of a benign fibroadenoma. Ultrasound of the left breast shows a slightly hypoechoic/nearly isoechoic circumscribed oval mass at 12 o'clock position 4 cm from nipple measuring 1.0 x 0.6 x 0.7 cm, corresponding to the probable degenerating fibroadenoma seen on mammography. Ultrasound of the outer left breast shows normal fibroglandular tissue. The asymmetry seen on the mammogram is consistent with normal fibroglandular densities. Ultrasound of the right axilla is negative for lymphadenopathy. IMPRESSION:  1. Findings suspicious for malignancy in the right breast, with an irregular spiculated mass at 10 o'clock position. Please note that there are suspicious microcalcifications within the mass and calcifications are seen 2 cm deep to the mass, which should be excised at the time of surgery if malignancy is proven. 2. 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 3. Benign cyst 9 o'clock periareolar right breast. 4. 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 RECOMMENDATION: 1. Ultrasound-guided biopsy of the suspicious right breast mass in the 10 o'clock position is recommended and has been scheduled for Oct 08, 2016. 2. The patient was given the option of follow-up ultrasound of the probable fibroadenoma in the medial right breast in 6 months for the option of ultrasound-guided biopsy. Given the suspicious findings in the 10 o'clock position of the right breast, the patient prefers ultrasound-guided biopsy of the probable fibroadenoma in the 3 o'clock right breast. This biopsy has also been scheduled for Oct 08, 2016. 3. Recommend six-month follow-up left breast ultrasound in  6 months. I have discussed the findings and recommendations with the patient. Results were also provided in writing at the conclusion of the visit. If applicable, a reminder letter will be sent to the patient regarding the next appointment. BI-RADS CATEGORY  5: Highly suggestive of malignancy. Electronically Signed   By: Curlene Dolphin M.D.   On: 10/01/2016 16:53   Mm Diag Breast Tomo Bilateral  Result Date: 10/01/2016 CLINICAL DATA:  46 year old patient presents with a recent periareolar breast pain upper outer quadrant right breast. She denies any nipple discharge. She states that she has had breast biopsies in the past, but no prior imaging or records are available. The patient's most recent mammogram was greater than 10 years ago and is not available. She recently moved to [facility]. EXAM: 2D DIGITAL DIAGNOSTIC BILATERAL MAMMOGRAM WITH CAD AND ADJUNCT TOMO ULTRASOUND BILATERAL BREAST COMPARISON:  None ACR Breast Density Category b: There are scattered areas of fibroglandular density. FINDINGS: There is an irregular mass in the far outer right breast with associated architectural distortion and a few associated microcalcifications. Approximately 2 cm deep to the mass is a group of heterogeneous microcalcifications, which are suspicious, especially given the presence of the spiculated irregular mass. There is a circumscribed lobulated mass in the lower inner quadrant of the right breast that measures approximately 13 mm. No suspicious findings are seen in the region of palpable concern in the periareolar right breast. In the left breast is a 8 mm circumscribed mass with focal internal dystrophic calcification likely reflecting a in benign fibroadenoma. In the lower outer quadrant of the left breast is probable asymmetric fibroglandular tissue. Mammographic images were processed with CAD. On physical exam, there is a surgical scar in the upper-outer quadrant of the right breast. I do not palpate a mass in  the upper outer periareolar right breast in the region of recent patient pain. No discrete mass is palpated in the 10 o'clock position of the right breast in the region of the suspicious findings on mammography. No discrete mass is palpated in the medial left breast where the circumscribed mass is seen on mammography. No mass is palpated in the left breast. Targeted ultrasound is performed, showing on the right: There is an irregular hypoechoic mass with acoustic shadowing in the 10 o'clock position of the right breast 7 cm from the nipple measuring 2.0 x 1.5 x 1.0 cm. Prominent vessel is seen adjacent to the mass. In  the periareolar right breast in the region of recent patient tenderness is a cyst with internal debris measuring 1.0 x 1.0 x 0.5 cm, consistent with a benign cyst. Ultrasound the medial right breast demonstrates a circumscribed hypoechoic gently lobulated mass at 3 o'clock position 8 cm from the nipple measuring 1.4 x 0.9 x 1.0 cm. This corresponds to the mass seen on mammography and has imaging features most suggestive of a benign fibroadenoma. Ultrasound of the left breast shows a slightly hypoechoic/nearly isoechoic circumscribed oval mass at 12 o'clock position 4 cm from nipple measuring 1.0 x 0.6 x 0.7 cm, corresponding to the probable degenerating fibroadenoma seen on mammography. Ultrasound of the outer left breast shows normal fibroglandular tissue. The asymmetry seen on the mammogram is consistent with normal fibroglandular densities. Ultrasound of the right axilla is negative for lymphadenopathy. IMPRESSION: 1. Findings suspicious for malignancy in the right breast, with an irregular spiculated mass at 10 o'clock position. Please note that there are suspicious microcalcifications within the mass and calcifications are seen 2 cm deep to the mass, which should be excised at the time of surgery if malignancy is proven. 2. 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 3. Benign cyst 9 o'clock periareolar right breast. 4. 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 RECOMMENDATION: 1. Ultrasound-guided biopsy of the suspicious right breast mass in the 10 o'clock position is recommended and has been scheduled for Oct 08, 2016. 2. The patient was given the option of follow-up ultrasound of the probable fibroadenoma in the medial right breast in 6 months for the option of ultrasound-guided biopsy. Given the suspicious findings in the 10 o'clock position of the right breast, the patient prefers ultrasound-guided biopsy of the probable fibroadenoma in the 3 o'clock right breast. This biopsy has also been scheduled for Oct 08, 2016. 3. Recommend six-month follow-up left breast ultrasound in 6 months. I have discussed the findings and recommendations with the patient. Results were also provided in writing at the conclusion of the visit. If applicable, a reminder letter will be sent to the patient regarding the next appointment. BI-RADS CATEGORY  5: Highly suggestive of malignancy. Electronically Signed   By: Britta Mccreedy M.D.   On: 10/01/2016 16:53   Mm Clip Placement Right  Result Date: 10/08/2016 CLINICAL DATA:  Post biopsy mammogram of the right breast for clip placement EXAM: DIAGNOSTIC RIGHT MAMMOGRAM POST ULTRASOUND BIOPSY COMPARISON:  Previous exam(s). FINDINGS: Mammographic images were obtained following ultrasound guided biopsy of right breast mass at 10 o'clock. The ribbon shaped biopsy marking clip is appropriately positioned within the spiculated mass at 10 o'clock in the right breast. IMPRESSION: Appropriate positioning of the ribbon shaped biopsy marking clip within the spiculated mass in the right breast at 10 o'clock. Final Assessment: Post Procedure Mammograms for Marker Placement Electronically Signed   By: Frederico Hamman M.D.   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 Microscopic Comment: The invasive carcinoma appears grade 1-2. This was found to be concordant by Dr. Thomasena Edis. Recommendation: Surgical and oncology referrals. The patient also has probable benign masses in both breasts, but at the time of biopsy, she told Dr. Thomasena Edis she was sure she would have mastectomy if this is cancer. If she changes her mind about  the mastectomies, she should have a biopsy of the right breast mass, and possibly the left breast mass prior to surgery. Results and recommendations were relayed to Dr. Brynda Greathouse nurse on 10/10/16 by Jetta Lout, [facility]). The patient had an appointment that day to see Dr. Elonda Husky at 1:45 PM. Results were given to the patient by Dr. Elonda Husky during that office visit. The patient will be referred to Dr. Arnoldo Morale at [facility]. The patient was contacted by Jetta Lout, [place] on 10/11/16 for a post biopsy check. She stated she has done well following the biopsy with no bleeding, bruising, or hematoma. She has some mild soreness. Post biopsy instructions were reviewed with the patient. All of her questions were answered. She was encouraged to call the imaging department of [facility] with any further questions or concerns. Addendum by Jetta Lout, RRA on 10/11/16. Electronically Signed   By: Ammie Ferrier M.D.   On: 10/11/2016 12:09   Result Date: 10/11/2016 CLINICAL DATA:  46 year old female presenting for ultrasound-guided biopsy of a right breast mass. EXAM: ULTRASOUND GUIDED RIGHT BREAST CORE NEEDLE BIOPSY COMPARISON:  Previous exam(s). FINDINGS: I met with the patient and we discussed the procedure of ultrasound-guided biopsy, including benefits and alternatives. We discussed the high likelihood of a successful procedure. We discussed the risks of the procedure, including  infection, bleeding, tissue injury, clip migration, and inadequate sampling. Informed written consent was given. The usual time-out protocol was performed immediately prior to the procedure. Lesion quadrant: Upper-outer Using sterile technique and 1% Lidocaine as local anesthetic, under direct ultrasound visualization, a 14 gauge spring-loaded device was used to perform biopsy of a right breast mass at 10 o'clock using an inferior approach. At the conclusion of the procedure a ribbon shaped tissue marker clip was deployed into the biopsy cavity. Follow up 2 view mammogram was performed and dictated separately. The initial plan was to biopsy two sites in the right breast, however the patient stated that she is certain she would have a mastectomy if the site biopsied today indicated the presence of malignancy given the family history of breast cancer. Therefore, we decided not to proceed with a second biopsy in the right breast and await pathology. IMPRESSION: 1. Ultrasound guided biopsy of the right breast mass at 10 o'clock. No apparent complications. 2. The biopsy of the second site of right breast at 3 o'clock was canceled due to patient's request. If she changes her mind or if there is medical need to perform additional biopsies, it may be performed at a later date. Electronically Signed: By: Ammie Ferrier M.D. On: 10/08/2016 13:59      Assessment: Menometrorrhagia dysmenorrhea Patient Active Problem List   Diagnosis Date Noted  . Invasive ductal carcinoma of breast ([place]) 10/14/2016  . Fibroids, submucosal 10/04/2016  . Menstrual cramps 09/25/2016  . Menorrhagia with regular cycle 09/25/2016  . Mass of upper outer quadrant of right breast 09/25/2016  . Depression   . Hypertension     Plan: Hysteroscopy uterine curettage NovaSure ablation  Kiana Hollar H 10/23/2016 10:07 AM

## 2016-10-23 NOTE — Anesthesia Procedure Notes (Signed)
Procedure Name: Intubation Date/Time: 10/23/2016 11:08 AM Performed by: Andree Elk, AMY A Pre-anesthesia Checklist: Patient identified, Patient being monitored, Timeout performed, Emergency Drugs available and Suction available Patient Re-evaluated:Patient Re-evaluated prior to inductionOxygen Delivery Method: Circle System Utilized Preoxygenation: Pre-oxygenation with 100% oxygen Intubation Type: IV induction Ventilation: Mask ventilation without difficulty Laryngoscope Size: Miller, 2 and Mac Grade View: Grade I Tube type: Oral Tube size: 7.0 mm Number of attempts: 1 Airway Equipment and Method: Stylet Placement Confirmation: ETT inserted through vocal cords under direct vision,  positive ETCO2 and breath sounds checked- equal and bilateral Secured at: 21 cm Tube secured with: Tape Dental Injury: Teeth and Oropharynx as per pre-operative assessment

## 2016-10-23 NOTE — Anesthesia Postprocedure Evaluation (Signed)
Anesthesia Post Note Late entry for 1430 Patient: Susan Benton  Procedure(s) Performed: Procedure(s) (LRB): MASTECTOMY MODIFIED RADICAL (Right) SIMPLE MASTECTOMY (Left) DILATATION & CURETTAGE/HYSTEROSCOPY WITH NOVASURE ENDOMETRIAL ABLATION (N/A)  Patient location during evaluation: PACU Anesthesia Type: General Level of consciousness: awake and alert and oriented Pain management: pain level controlled Vital Signs Assessment: post-procedure vital signs reviewed and stable Respiratory status: spontaneous breathing and respiratory function stable Cardiovascular status: stable : Nausea relieved with Zofran. Anesthetic complications: no     Last Vitals:  Vitals:   10/23/16 1430 10/23/16 1445  BP: 117/72 (!) 106/56  Pulse: 68 70  Resp: 14 11  Temp:      Last Pain:  Vitals:   10/23/16 1445  TempSrc:   PainSc: 10-Worst pain ever                 ADAMS, AMY A

## 2016-10-23 NOTE — Interval H&P Note (Signed)
History and Physical Interval Note:  10/23/2016 10:01 AM  Susan Benton  has presented today for surgery, with the diagnosis of right breast cancer, family history of breast cancer;Excessive and Frequent Menstruation, Dysmenorrhea  The various methods of treatment have been discussed with the patient and family. After consideration of risks, benefits and other options for treatment, the patient has consented to  Procedure(s): MASTECTOMY MODIFIED RADICAL (Right) SIMPLE MASTECTOMY (Left) DILATATION & CURETTAGE/HYSTEROSCOPY WITH NOVASURE ABLATION (N/A) as a surgical intervention .  The patient's history has been reviewed, patient examined, no change in status, stable for surgery.  I have reviewed the patient's chart and labs.  Questions were answered to the patient's satisfaction.     Aviva Signs

## 2016-10-23 NOTE — Anesthesia Preprocedure Evaluation (Signed)
Anesthesia Evaluation  Patient identified by MRN, date of birth, ID band Patient awake    Reviewed: Allergy & Precautions, NPO status , Patient's Chart, lab work & pertinent test results, reviewed documented beta blocker date and time   Airway Mallampati: II  TM Distance: >3 FB Neck ROM: Full    Dental  (+) Teeth Intact   Pulmonary former smoker,    breath sounds clear to auscultation       Cardiovascular hypertension, Pt. on medications and Pt. on home beta blockers  Rhythm:Regular Rate:Normal     Neuro/Psych PSYCHIATRIC DISORDERS Depression    GI/Hepatic negative GI ROS, (+) Hepatitis -, C  Endo/Other    Renal/GU      Musculoskeletal   Abdominal   Peds  Hematology   Anesthesia Other Findings   Reproductive/Obstetrics                            Anesthesia Physical Anesthesia Plan  ASA: II  Anesthesia Plan: General   Post-op Pain Management:    Induction: Intravenous  Airway Management Planned: Oral ETT  Additional Equipment:   Intra-op Plan:   Post-operative Plan: Extubation in OR  Informed Consent: I have reviewed the patients History and Physical, chart, labs and discussed the procedure including the risks, benefits and alternatives for the proposed anesthesia with the patient or authorized representative who has indicated his/her understanding and acceptance.     Plan Discussed with:   Anesthesia Plan Comments:         Anesthesia Quick Evaluation

## 2016-10-24 ENCOUNTER — Encounter (HOSPITAL_COMMUNITY): Payer: Self-pay | Admitting: General Surgery

## 2016-10-24 LAB — CBC
HEMATOCRIT: 34 % — AB (ref 36.0–46.0)
Hemoglobin: 11 g/dL — ABNORMAL LOW (ref 12.0–15.0)
MCH: 27 pg (ref 26.0–34.0)
MCHC: 32.4 g/dL (ref 30.0–36.0)
MCV: 83.3 fL (ref 78.0–100.0)
Platelets: 299 10*3/uL (ref 150–400)
RBC: 4.08 MIL/uL (ref 3.87–5.11)
RDW: 14.5 % (ref 11.5–15.5)
WBC: 10.2 10*3/uL (ref 4.0–10.5)

## 2016-10-24 LAB — BASIC METABOLIC PANEL
ANION GAP: 7 (ref 5–15)
BUN: 16 mg/dL (ref 6–20)
CHLORIDE: 102 mmol/L (ref 101–111)
CO2: 25 mmol/L (ref 22–32)
Calcium: 8.7 mg/dL — ABNORMAL LOW (ref 8.9–10.3)
Creatinine, Ser: 0.56 mg/dL (ref 0.44–1.00)
GFR calc non Af Amer: >60 mL/min (ref >60)
GLUCOSE: 135 mg/dL — AB (ref 65–99)
Potassium: 4.3 mmol/L (ref 3.5–5.1)
Sodium: 134 mmol/L — ABNORMAL LOW (ref 135–145)

## 2016-10-24 LAB — PHOSPHORUS: PHOSPHORUS: 4.4 mg/dL (ref 2.5–4.6)

## 2016-10-24 LAB — MAGNESIUM: Magnesium: 1.9 mg/dL (ref 1.7–2.4)

## 2016-10-24 MED ORDER — OXYCODONE-ACETAMINOPHEN 7.5-325 MG PO TABS: 1.0000 | ORAL_TABLET | ORAL | 0 refills | Status: DC | PRN

## 2016-10-24 NOTE — Discharge Instructions (Signed)
Total or Modified Radical Mastectomy, Care After °Refer to this sheet in the next few weeks. These instructions provide you with information about caring for yourself after your procedure. Your health care provider may also give you more specific instructions. Your treatment has been planned according to current medical practices, but problems sometimes occur. Call your health care provider if you have any problems or questions after your procedure. °What can I expect after the procedure? °After your procedure, it is common to have: °· Pain. °· Numbness. °· Stiffness in your arm or shoulder. °· Feelings of stress, sadness, or depression. ° °If the lymph nodes under your arm were removed, you may have arm swelling, weakness, or numbness on the same side of your body as your surgery. °Follow these instructions at home: °Incision care °· There are many different ways to close and cover an incision, including stitches, skin glue, and adhesive strips. Follow your health care provider's instructions about: °? Incision care. °? Bandage (dressing) changes and removal. °? Incision closure removal. °· Check your incision area every day for signs of infection. Watch for: °? Redness, swelling, or pain. °? Fluid, blood, or pus. °· If you were sent home with a surgical drain in place, follow your health care provider's instructions for emptying it. °Bathing °· Do not take baths, swim, or use a hot tub until your health care provider approves. °· Take sponge baths until your health care provider says that you can start showering or bathing. °Activity °· Return to your normal activities as directed by your health care provider. °· Avoid strenuous exercise. °· Be careful to avoid any activities that could cause an injury to your arm on the side of your surgery. °· Do not lift anything that is heavier than 10 lb (4.5 kg). Avoid lifting with the arm that is on the side of your surgery. °· Do not carry heavy objects on your  shoulder. °· After your drain is removed, you should perform exercises to keep your arm from getting stiff and swollen. Talk with your health care provider about which exercises are safe for you. °General instructions °· Take medicines only as directed by your health care provider. °· You may eat what you usually do. °· Keep your arm elevated when at rest. °· Do not wear tight jewelry on your arm, wrist, or fingers on the side of your surgery. °· Get checked for extra fluid around your lymph nodes (lymphedema) as often as told by your health care provider. °· If you had a modified radical mastectomy, always let your health care providers know that lymph nodes under your arm were removed. This is important information to share before you are involved in certain procedures, such as giving blood or having your blood pressure taken. °Contact a health care provider if: °· You have a fever. °· Your pain medicine is not working. °· Your arm swelling, weakness, or numbness has not improved after a few weeks. °· You have new swelling in your breast or arm. °· You have redness, swelling, or pain in your incision area. °· You have fluid, blood, or pus coming from your incision. °Get help right away if: °· You have very bad pain in your breast or arm. °· You have chest pain. °· You have difficulty breathing. °This information is not intended to replace advice given to you by your health care provider. Make sure you discuss any questions you have with your health care provider. °Document Released: 01/04/2004 Document Revised: 01/18/2016   Document Reviewed: 01/26/2014 °Elsevier Interactive Patient Education © 2018 Elsevier Inc[employer] °Surgical Drain Home Care °Surgical drains are used to remove extra fluid that normally builds up in a surgical wound after surgery. A surgical drain helps to heal a surgical wound. Different kinds of surgical drains include: °· Active drains. These drains use suction to pull drainage away from the surgical  wound. Drainage flows through a tube to a container outside of the body. It is important to keep the bulb or the drainage container flat (compressed) at all times, except while you empty it. Flattening the bulb or container creates suction. The two most common types of active drains are bulb drains and Hemovac drains. °· Passive drains. These drains allow fluid to drain naturally, by gravity. Drainage flows through a tube to a bandage (dressing) or a container outside of the body. Passive drains do not need to be emptied. The most common type of passive drain is the Penrose drain. ° °A drain is placed during surgery. Immediately after surgery, drainage is usually bright red and a little thicker than water. The drainage may gradually turn yellow or pink and become thinner. It is likely that your health care provider will remove the drain when the drainage stops or when the amount decreases to 1-2 Tbsp (15-30 mL) during a 24-hour period. °How to care for your surgical drain °· Keep the skin around the drain dry and covered with a dressing at all times. °· Check your drain area every day for signs of infection. Check for: °? More redness, swelling, or pain. °? Pus or a bad smell. °? Cloudy drainage. °Follow instructions from your health care provider about how to take care of your drain and how to change your dressing. Change your dressing at least one time every day. Change it more often if needed to keep the dressing dry. Make sure you: °1. Gather your supplies, including: °? Tape. °? Germ-free cleaning solution (sterile saline). °? Split gauze drain sponge: 4 x 4 inches (10 x 10 cm). °? Gauze square: 4 x 4 inches (10 x 10 cm). °2. Wash your hands with soap and water before you change your dressing. If soap and water are not available, use hand sanitizer. °3. Remove the old dressing. Avoid using scissors to do that. °4. Use sterile saline to clean your skin around the drain. °5. Place the tube through the slit in a  drain sponge. Place the drain sponge so that it covers your wound. °6. Place the gauze square or another drain sponge on top of the drain sponge that is on the wound. Make sure the tube is between those layers. °7. Tape the dressing to your skin. °8. If you have an active bulb or Hemovac drain, tape the drainage tube to your skin 1-2 inches (2.5-5 cm) below the place where the tube enters your body. Taping keeps the tube from pulling on any stitches (sutures) that you have. °9. Wash your hands with soap and water. °10. Write down the color of your drainage and how often you change your dressing. ° °How to empty your active bulb or Hemovac drain °1. Make sure that you have a measuring cup that you can empty your drainage into. °2. Wash your hands with soap and water. If soap and water are not available, use hand sanitizer. °3. Gently move your fingers down the tube while squeezing very lightly. This is called stripping the tube. This clears any drainage, clots, or tissue from the tube. °?   Do not pull on the tube. ? You may need to strip the tube several times every day to keep the tube clear. 4. Open the bulb cap or the drain plug. Do not touch the inside of the cap or the bottom of the plug. 5. Empty all of the drainage into the measuring cup. 6. Compress the bulb or the container and replace the cap or the plug. To compress the bulb or the container, squeeze it firmly in the middle while you close the cap or plug the container. 7. Write down the amount of drainage that you have in each 24-hour period. If you have less than 2 Tbsp (30 mL) of drainage during 24 hours, contact your health care provider. 8. Flush the drainage down the toilet. 9. Wash your hands with soap and water. Contact a health care provider if:  You have more redness, swelling, or pain around your drain area.  The amount of drainage that you have is increasing instead of decreasing.  You have pus or a bad smell coming from your drain  area.  You have a fever.  You have drainage that is cloudy.  There is a sudden stop or a sudden decrease in the amount of drainage that you have.  Your tube falls out.  Your active draindoes not stay compressedafter you empty it. This information is not intended to replace advice given to you by your health care provider. Make sure you discuss any questions you have with your health care provider. Document Released: 05/10/2000 Document Revised: 10/19/2015 Document Reviewed: 11/30/2014 Elsevier Interactive Patient Education  2018 [employer]. Endometrial Ablation Endometrial ablation is a procedure that destroys the thin inner layer of the lining of the uterus (endometrium). This procedure may be done:  To stop heavy periods.  To stop bleeding that is causing anemia.  To control irregular bleeding.  To treat bleeding caused by small tumors (fibroids) in the endometrium. This procedure is often an alternative to major surgery, such as removal of the uterus and cervix (hysterectomy). As a result of this procedure:  You may not be able to have children. However, if you are premenopausal (you have not gone through menopause):  You may still have a small chance of getting pregnant.  You will need to use a reliable method of birth control after the procedure to prevent pregnancy.  You may stop having a menstrual period, or you may have only a small amount of bleeding during your period. Menstruation may return several years after the procedure. Tell a health care provider about:  Any allergies you have.  All medicines you are taking, including vitamins, herbs, eye drops, creams, and over-the-counter medicines.  Any problems you or family members have had with the use of anesthetic medicines.  Any blood disorders you have.  Any surgeries you have had.  Any medical conditions you have. What are the risks? Generally, this is a safe procedure. However, problems may occur,  including:  A hole (perforation) in the uterus or bowel.  Infection of the uterus, bladder, or vagina.  Bleeding.  Damage to other structures or organs.  An air bubble in the lung (air embolus).  Problems with pregnancy after the procedure.  Failure of the procedure.  Decreased ability to diagnose cancer in the endometrium. What happens before the procedure?  You will have tests of your endometrium to make sure there are no pre-cancerous cells or cancer cells present.  You may have an ultrasound of the uterus.  You  may be given medicines to thin the endometrium.  Ask your health care provider about:  Changing or stopping your regular medicines. This is especially important if you take diabetes medicines or blood thinners.  Taking medicines such as aspirin and ibuprofen. These medicines can thin your blood. Do not take these medicines before your procedure if your doctor tells you not to.  Plan to have someone take you home from the hospital or clinic. What happens during the procedure?  You will lie on an exam table with your feet and legs supported as in a pelvic exam.  To lower your risk of infection:  Your health care team will wash or sanitize their hands and put on germ-free (sterile) gloves.  Your genital area will be washed with soap.  An IV tube will be inserted into one of your veins.  You will be given a medicine to help you relax (sedative).  A surgical instrument with a light and camera (resectoscope) will be inserted into your vagina and moved into your uterus. This allows your surgeon to see inside your uterus.  Endometrial tissue will be removed using one of the following methods:  Radiofrequency. This method uses a radiofrequency-alternating electric current to remove the endometrium.  Cryotherapy. This method uses extreme cold to freeze the endometrium.  Heated-free liquid. This method uses a heated saltwater (saline) solution to remove the  endometrium.  Microwave. This method uses high-energy microwaves to heat up the endometrium and remove it.  Thermal balloon. This method involves inserting a catheter with a balloon tip into the uterus. The balloon tip is filled with heated fluid to remove the endometrium. The procedure may vary among health care providers and hospitals. What happens after the procedure?  Your blood pressure, heart rate, breathing rate, and blood oxygen level will be monitored until the medicines you were given have worn off.  As tissue healing occurs, you may notice vaginal bleeding for 4-6 weeks after the procedure. You may also experience:  Cramps.  Thin, watery vaginal discharge that is light pink or brown in color.  A need to urinate more frequently than usual.  Nausea.  Do not drive for 24 hours if you were given a sedative.  Do not have sex or insert anything into your vagina until your health care provider approves. Summary  Endometrial ablation is done to treat the many causes of heavy menstrual bleeding.  The procedure may be done only after medications have been tried to control the bleeding.  Plan to have someone take you home from the hospital or clinic. This information is not intended to replace advice given to you by your health care provider. Make sure you discuss any questions you have with your health care provider. Document Released: 03/22/2004 Document Revised: 05/30/2016 Document Reviewed: 05/30/2016 Elsevier Interactive Patient Education  2017 [employer].

## 2016-10-24 NOTE — Discharge Summary (Signed)
Physician Discharge Summary  Patient ID: Susan Benton MRN: [id number] DOB/AGE: [birth year] 47 y.o.  Admit date: 10/23/2016 Discharge date: 10/24/2016  Admission Diagnoses:Right breast cancer, family history of breast cancer  Discharge Diagnoses: Same Active Problems:   Malignant neoplasm of upper-outer quadrant of right female breast [facility])   Family history of breast cancer   S/P right mastectomy   Discharged Condition: good  Hospital Course: Patient is a 46 year old white female who was recently diagnosed with right breast cancer who underwent a right modified radical mastectomy and prophylactic left simple mastectomy on 10/23/2016. She tolerated the surgery well. Her postoperative course has been unremarkable. Her diet was advanced without difficulty. Final pathology is pending. She is being discharged home in good and improving condition.  Treatments: surgery: Right modified radical mastectomy, prophylactic left simple mastectomy on 10/23/2016  Discharge Exam: Blood pressure 114/75, pulse 65, temperature 98.4 F (36.9 C), temperature source Oral, resp. rate 18, height 5\' 9"  (1.753 m), weight 261 lb (118.4 kg), last menstrual period 10/01/2016, SpO2 97 %. General appearance: alert, cooperative and no distress Resp: clear to auscultation bilaterally Breasts: Dressings dry and intact. JP drainage bilaterally sanguinous in nature. Cardio: regular rate and rhythm, S1, S2 normal, no murmur, click, rub or gallop  Disposition: Home  Discharge Instructions    Diet general    Complete by:  As directed    Increase activity slowly    Complete by:  As directed      Allergies as of 10/24/2016   No Known Allergies     Medication List    STOP taking these medications   gabapentin 300 MG capsule Commonly known as:  NEURONTIN     TAKE these medications   ALPRAZolam 0.5 MG tablet Commonly known as:  XANAX Take 2 tablets (1 mg total) by mouth 3 (three) times daily as needed for  anxiety.   atenolol 100 MG tablet Commonly known as:  TENORMIN TAKE 1 TABLET BY MOUTH DAILY   escitalopram 10 MG tablet Commonly known as:  LEXAPRO TAKE 1 TABLET(10 MG) BY MOUTH DAILY   IMODIUM PO Take 2 tablets by mouth daily.   lisinopril 10 MG tablet Commonly known as:  PRINIVIL,ZESTRIL TAKE 1 TABLET(10 MG) BY MOUTH DAILY   ondansetron 4 MG tablet Commonly known as:  ZOFRAN Take 1 tablet (4 mg total) by mouth every 8 (eight) hours as needed for nausea or vomiting.   oxyCODONE-acetaminophen 7.5-325 MG tablet Commonly known as:  PERCOCET Take 1-2 tablets by mouth every 4 (four) hours as needed.   rOPINIRole 2 MG tablet Commonly known as:  REQUIP 1-2 tabs po qhs      Follow-up Information    Florian Buff, MD Follow up in 2 week(s).   Specialties:  Obstetrics and Gynecology, Radiology Why:  post op visit Contact information: [place] [address] [phone]        Aviva Signs, MD. Schedule an appointment as soon as possible for a visit on 10/29/2016.   Specialty:  General Surgery Contact information: [address] [place] [address] [phone]           Signed: Aviva Signs 10/24/2016, 8:27 AM

## 2016-10-24 NOTE — Addendum Note (Signed)
Addendum  created 10/24/16 0948 by Ollen Bowl, CRNA   Sign clinical note

## 2016-10-24 NOTE — Anesthesia Postprocedure Evaluation (Signed)
Anesthesia Post Note  Patient: Susan Benton  Procedure(s) Performed: Procedure(s) (LRB): MASTECTOMY MODIFIED RADICAL (Right) SIMPLE MASTECTOMY (Left) DILATATION & CURETTAGE/HYSTEROSCOPY WITH NOVASURE ENDOMETRIAL ABLATION (N/A)  Patient location during evaluation: Nursing Unit Anesthesia Type: General Level of consciousness: awake and alert and oriented Pain management: pain level controlled Vital Signs Assessment: post-procedure vital signs reviewed and stable Respiratory status: spontaneous breathing Cardiovascular status: blood pressure returned to baseline and stable Postop Assessment: no signs of nausea or vomiting Anesthetic complications: no     Last Vitals:  Vitals:   10/24/16 0400 10/24/16 0845  BP: 114/75 (!) 122/94  Pulse: 65 91  Resp: 18 16  Temp: 36.9 C 36.7 C    Last Pain:  Vitals:   10/24/16 0845  TempSrc: Oral  PainSc: 4                  Kazim Corrales

## 2016-10-24 NOTE — Care Management Note (Signed)
Case Management Note  Patient Details  Name: Susan Benton MRN: [id number] Date of Birth: [birth year]  Subjective/Objective:                  S/P mastectomy. Chart reviewed for CM needs. Pt form home, ind with ALD's, has hospital f.u. And insurance with drug coverage.   Action/Plan: Discharging home today with self care. NO CM needs.   Expected Discharge Date:  10/24/16               Expected Discharge Plan:  Home/Self Care  In-House Referral:  NA  Discharge planning Services  CM Consult  Post Acute Care Choice:  NA Choice offered to:  NA  Status of Service:  Completed, signed off   Sherald Barge, RN 10/24/2016, 9:14 AM

## 2016-10-26 ENCOUNTER — Encounter: Payer: Self-pay | Admitting: General Surgery

## 2016-10-29 ENCOUNTER — Ambulatory Visit (INDEPENDENT_AMBULATORY_CARE_PROVIDER_SITE_OTHER): Payer: Self-pay | Admitting: General Surgery

## 2016-10-29 ENCOUNTER — Encounter: Payer: Self-pay | Admitting: General Surgery

## 2016-10-29 VITALS — BP 156/88 | HR 85 | Temp 96.8°F | Resp 18 | Ht 69.0 in | Wt 257.0 lb

## 2016-10-29 DIAGNOSIS — Z09 Encounter for follow-up examination after completed treatment for conditions other than malignant neoplasm: Secondary | ICD-10-CM

## 2016-10-29 MED ORDER — OXYCODONE-ACETAMINOPHEN 7.5-325 MG PO TABS: 1.0000 | ORAL_TABLET | ORAL | 0 refills | Status: DC | PRN

## 2016-10-29 MED ORDER — GABAPENTIN 400 MG PO CAPS: 300.0000 mg | ORAL_CAPSULE | Freq: Three times a day (TID) | ORAL | 0 refills | Status: DC

## 2016-10-29 NOTE — Progress Notes (Signed)
Subjective:     Susan Benton status post right modified radical mastectomy, left simple mastectomy. Is having right axillary pain which seems to be sensory nature. She is able to move her right arm without difficulty. It has been minimal from both sides.  Objective:    BP (!) 156/88   Pulse 85   Temp (!) 96.8 F (36 C)   Resp 18   Ht 5\' 9"  (1.753 m)   Wt 257 lb (116.6 kg)   LMP 10/01/2016 (Exact Date)   BMI 37.95 kg/m   General:  alert, cooperative and Anxious  Both mastectomy incisions healing well. Both JP drains removed. She did have some relief of pain in the right axilla after drain removal. 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 Left breast unremarkable. Patient is aware that no lymph nodes were found in the right axilla.     Assessment:    Doing well postoperatively. With decreasing postoperative pain    Plan:Referral to oncology has been made. We'll see patient in 1 week for follow-up. gabapentin and Percocet have been prescribed for postoperative pain.

## 2016-11-05 ENCOUNTER — Encounter: Payer: Self-pay | Admitting: General Surgery

## 2016-11-05 ENCOUNTER — Ambulatory Visit (INDEPENDENT_AMBULATORY_CARE_PROVIDER_SITE_OTHER): Payer: Self-pay | Admitting: General Surgery

## 2016-11-05 VITALS — BP 177/83 | HR 87 | Temp 96.8°F | Resp 18 | Ht 69.0 in | Wt 257.0 lb

## 2016-11-05 DIAGNOSIS — Z09 Encounter for follow-up examination after completed treatment for conditions other than malignant neoplasm: Secondary | ICD-10-CM

## 2016-11-05 DIAGNOSIS — I10 Essential (primary) hypertension: Secondary | ICD-10-CM

## 2016-11-05 DIAGNOSIS — C50411 Malignant neoplasm of upper-outer quadrant of right female breast: Secondary | ICD-10-CM

## 2016-11-05 MED ORDER — OXYCODONE-ACETAMINOPHEN 5-325 MG PO TABS: 1.0000 | ORAL_TABLET | ORAL | 0 refills | Status: DC | PRN

## 2016-11-05 MED ORDER — ALPRAZOLAM 0.5 MG PO TABS: 1.0000 mg | ORAL_TABLET | Freq: Three times a day (TID) | ORAL | 1 refills | Status: DC | PRN

## 2016-11-05 MED ORDER — ATENOLOL 100 MG PO TABS: 100.0000 mg | ORAL_TABLET | Freq: Every day | ORAL | 1 refills | Status: AC

## 2016-11-05 NOTE — Progress Notes (Signed)
Subjective:     Susan Benton  Status post bilateral mastectomy. Right sided incisional pain has decreased. She still is having some right axillary and superior flap pain at the right mastectomy site. Objective:    BP (!) 177/83   Pulse 87   Temp (!) 96.8 F (36 C)   Resp 18   Ht 5\' 9"  (1.753 m)   Wt 257 lb (116.6 kg)   BMI 37.95 kg/m   General:  alert, cooperative and no distress  Bilateral mastectomy sites healing well. Staples removed, sterile strips applied.     Assessment:    Doing well postoperatively. Resolving incisional pain on right side    Plan:   To see oncology in 3 days. We will reorder Percocet and Xanax to help with anxiety and incisional pain. Follow-up in 2 weeks.

## 2016-11-08 ENCOUNTER — Encounter (HOSPITAL_COMMUNITY): Payer: BLUE CROSS/BLUE SHIELD

## 2016-11-08 ENCOUNTER — Encounter (HOSPITAL_COMMUNITY): Payer: BLUE CROSS/BLUE SHIELD | Attending: Hematology | Admitting: Hematology

## 2016-11-08 ENCOUNTER — Encounter (HOSPITAL_COMMUNITY): Payer: Self-pay | Admitting: Hematology

## 2016-11-08 VITALS — BP 142/76 | HR 94 | Resp 118 | Ht 69.0 in | Wt 259.5 lb

## 2016-11-08 DIAGNOSIS — I10 Essential (primary) hypertension: Secondary | ICD-10-CM

## 2016-11-08 DIAGNOSIS — Z87891 Personal history of nicotine dependence: Secondary | ICD-10-CM

## 2016-11-08 DIAGNOSIS — Z803 Family history of malignant neoplasm of breast: Secondary | ICD-10-CM

## 2016-11-08 DIAGNOSIS — D241 Benign neoplasm of right breast: Secondary | ICD-10-CM

## 2016-11-08 DIAGNOSIS — D242 Benign neoplasm of left breast: Secondary | ICD-10-CM | POA: Diagnosis not present

## 2016-11-08 DIAGNOSIS — N83201 Unspecified ovarian cyst, right side: Secondary | ICD-10-CM | POA: Diagnosis not present

## 2016-11-08 DIAGNOSIS — Z8619 Personal history of other infectious and parasitic diseases: Secondary | ICD-10-CM

## 2016-11-08 DIAGNOSIS — C50411 Malignant neoplasm of upper-outer quadrant of right female breast: Secondary | ICD-10-CM | POA: Diagnosis not present

## 2016-11-08 LAB — CBC WITH DIFFERENTIAL/PLATELET
BASOS ABS: 0 10*3/uL (ref 0.0–0.1)
Basophils Relative: 0 %
EOS ABS: 0.4 10*3/uL (ref 0.0–0.7)
EOS PCT: 4 %
HCT: 37 % (ref 36.0–46.0)
Hemoglobin: 12.3 g/dL (ref 12.0–15.0)
LYMPHS PCT: 19 %
Lymphs Abs: 2.1 10*3/uL (ref 0.7–4.0)
MCH: 27.1 pg (ref 26.0–34.0)
MCHC: 33.2 g/dL (ref 30.0–36.0)
MCV: 81.5 fL (ref 78.0–100.0)
MONO ABS: 0.6 10*3/uL (ref 0.1–1.0)
Monocytes Relative: 5 %
Neutro Abs: 7.9 10*3/uL — ABNORMAL HIGH (ref 1.7–7.7)
Neutrophils Relative %: 72 %
PLATELETS: 386 10*3/uL (ref 150–400)
RBC: 4.54 MIL/uL (ref 3.87–5.11)
RDW: 13.8 % (ref 11.5–15.5)
WBC: 11 10*3/uL — AB (ref 4.0–10.5)

## 2016-11-08 LAB — COMPREHENSIVE METABOLIC PANEL
ALT: 30 U/L (ref 14–54)
AST: 30 U/L (ref 15–41)
Albumin: 3.8 g/dL (ref 3.5–5.0)
Alkaline Phosphatase: 56 U/L (ref 38–126)
Anion gap: 10 (ref 5–15)
BUN: 12 mg/dL (ref 6–20)
CHLORIDE: 102 mmol/L (ref 101–111)
CO2: 24 mmol/L (ref 22–32)
Calcium: 9.1 mg/dL (ref 8.9–10.3)
Creatinine, Ser: 0.63 mg/dL (ref 0.44–1.00)
GFR calc non Af Amer: >60 mL/min (ref >60)
Glucose, Bld: 107 mg/dL — ABNORMAL HIGH (ref 65–99)
POTASSIUM: 4.2 mmol/L (ref 3.5–5.1)
SODIUM: 136 mmol/L (ref 135–145)
Total Bilirubin: 0.3 mg/dL (ref 0.3–1.2)
Total Protein: 7.3 g/dL (ref 6.5–8.1)

## 2016-11-08 NOTE — Patient Instructions (Signed)
[place] at [facility] Discharge Instructions  RECOMMENDATIONS MADE BY THE CONSULTANT AND ANY TEST RESULTS WILL BE SENT TO YOUR REFERRING PHYSICIAN.  You were seen today by Dr. Irene Limbo. Labs today, we will call with your lab results. Keep appointment with Dr. Arnoldo Morale for follow up. Referral being sent for genetic counseling. Return in 2 weeks for follow up.   Thank you for choosing [place] at [facility] to provide your oncology and hematology care.  To afford each patient quality time with our provider, please arrive at least 15 minutes before your scheduled appointment time.    If you have a lab appointment with the [facility] please come in thru the  Main Entrance and check in at the main information desk  You need to re-schedule your appointment should you arrive 10 or more minutes late.  We strive to give you quality time with our providers, and arriving late affects you and other patients whose appointments are after yours.  Also, if you no show three or more times for appointments you may be dismissed from the clinic at the providers discretion.     Again, thank you for choosing [facility].  Our hope is that these requests will decrease the amount of time that you wait before being seen by our physicians.       _____________________________________________________________  Should you have questions after your visit to [facility], please contact our office at [phone] between the hours of 8:30 a.m. and 4:30 p.m.  Voicemails left after 4:30 p.m. will not be returned until the following business day.  For prescription refill requests, have your pharmacy contact our office.       Resources For Cancer Patients and their Caregivers ? American Cancer Society: Can assist with transportation, wigs, general needs, runs Look Good Feel Better.        318-688-2565 ? Cancer Care: Provides financial  assistance, online support groups, medication/co-pay assistance.  1-800-813-HOPE 479-814-5867) ? [facility] Assists [place] cancer patients and their families through emotional , educational and financial support.  437-805-5261 ? [place] DSS Where to apply for food stamps, Medicaid and utility assistance. (424) 542-1372 ? [facility]: Transportation to medical appointments. 301-744-1945 ? Social Security Administration: May apply for disability if have a Stage IV cancer. (601)480-1397 315-855-6139 ? [employer], Disability and Transit Services: Assists with nutrition, care and transit needs. [place] Support Programs: @10RELATIVEDAYS @ > Cancer Support Group  2nd Tuesday of the month 1pm-2pm, Journey Room  > Creative Journey  3rd Tuesday of the month 1130am-1pm, Journey Room  > Look Good Feel Better  1st Wednesday of the month 10am-12 noon, Journey Room (Call [place] to register [phone])

## 2016-11-08 NOTE — Progress Notes (Signed)
Marland Kitchen    HEMATOLOGY/ONCOLOGY CONSULTATION NOTE  Date of Service: 11/08/2016  Patient Care Team: Patient, No Pcp Per as PCP - General (General Practice)  CHIEF COMPLAINTS/PURPOSE OF CONSULTATION:  Newly diagnosed Breast Cancer   HISTORY OF PRESENTING ILLNESS:  Susan Benton is a wonderful 47 y.o. female who has been referred to [place] by Dr Arnoldo Morale for evaluation and management of newly diagnosed breast cancer.  Patient has a history of hypertension, hepatitis C [treated with Harvoni for 3 weeks?], ex-smoker quit 3-4 months ago who has been referred to [place] for newly diagnosed breast cancer. Patient apparently felt a lump in the right breast and had a mammogram with her GYN practitioner on 10/01/2016 which showed an irregular spiculated mass at the 10:00 position 7 cm from the nipple measuring 2 x 1.5 x 1 cm and a circumscribed hypoechoic gently lobulated mass at 3 o'clock position 8 cm from the nipple measuring 1.4 x 0.9 x 1.0 cm. This corresponds to the mass seen on mammography and has imaging features most suggestive of a benign fibroadenoma. Ultrasound of the left breast shows a slightly hypoechoic/nearly isoechoic circumscribed oval mass at 12 o'clock position 4 cm from nipple measuring 1.0 x 0.6 x 0.7 cm, corresponding to the probable degenerating fibroadenoma seen on mammography.  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  Patient was relatively Dr. Arnoldo Morale and elected to have a right-sided modified radical mastectomy and left simple mastectomy. This was done on 10/23/2016.  Right breast modified radical mastectomy pathology revealed - MULTIFOCAL INVASIVE DUCTAL CARCINOMA, GRADE 1, SPANNING 4.6 CM AND 0.6 CM.  LOW GRADE DUCTAL CARCINOMA IN SITU. RESECTION MARGINS ARE NEGATIVE. FIBROADENOMAS.  MpT2, pNx, Mx. Unfortunately no lymph nodes were sampled and  so lymph node staging was inadequate. Mammogram and ultrasound did not show any clinical lymphadenopathy.  Left breast simple mastectomy -- FIBROADENOMA.  FIBROADENOMATOID CHANGE.  SCLEROSING ADENOSIS. - FIBROCYSTIC CHANGE.  Patient was sent to [place] for further evaluation regarding adjuvant treatments. She notes that her drains are out and her skin incisions are healing well. She is quite anxious and is accompanied by her husband and daughter.  She notes that her maternal grandmother had breast cancer at age 58 and maternal cousin had breast cancer at age 20 years. She notes no bone pains no shortness of breath or chest pain no focal neurological deficits no abdominal pain.  Patient reports that she had a history of hepatitis C but was only treated for 3 weeks with Harvoni which is somewhat unusual. No active follow-up for this currently.  Ex-smoker quit about 3-4 months ago.  MEDICAL HISTORY:  Past Medical History:  Diagnosis Date  . Arthritis   . Depression 2013  . Fibroids, submucosal 10/04/2016  . Hep C w/o coma, chronic ([place]) 06/27/2014  . Hx of right and left heart catheterization 2014  . Hypertension 1987  . Invasive ductal carcinoma of breast ([place]) 10/14/2016    SURGICAL HISTORY: Past Surgical History:  Procedure Laterality Date  . CARDIAC CATHETERIZATION    . DILITATION & CURRETTAGE/HYSTROSCOPY WITH NOVASURE ABLATION N/A 10/23/2016   Procedure: DILATATION & CURETTAGE/HYSTEROSCOPY WITH NOVASURE ENDOMETRIAL ABLATION;  Surgeon: Florian Buff, MD;  Location: [facility];  Service: Gynecology;  Laterality: N/A;  . MASTECTOMY MODIFIED RADICAL Right 10/23/2016   Procedure: MASTECTOMY MODIFIED RADICAL;  Surgeon: Aviva Signs, MD;  Location: [facility];  Service: General;  Laterality: Right;  . SIMPLE MASTECTOMY WITH AXILLARY SENTINEL NODE BIOPSY Left 10/23/2016   Procedure: SIMPLE MASTECTOMY;  Surgeon: Aviva Signs, MD;  Location: [facility];  Service: General;  Laterality: Left;  .  TONSILECTOMY, ADENOIDECTOMY, BILATERAL MYRINGOTOMY AND TUBES Bilateral 1984  . TUBAL LIGATION  1998   2000    SOCIAL HISTORY: Social History   Social History  . Marital status: Married    Spouse name: N/A  . Number of children: N/A  . Years of education: N/A   Occupational History  . Not on file.   Social History Main Topics  . Smoking status: Former Smoker    Packs/day: 0.25    Years: 20.00    Types: Cigarettes    Quit date: 07/20/2016  . Smokeless tobacco: Never Used     Comment: 2 per day  . Alcohol use No  . Drug use: No  . Sexual activity: Yes    Birth control/ protection: Surgical     Comment: tubal   Other Topics Concern  . Not on file   Social History Narrative  . No narrative on file  Patient is a registered nurse  FAMILY HISTORY: Family History  Problem Relation Age of Onset  . Hypertension Mother   . Cancer Mother   . Diabetes Father   . Hypertension Father   . Kidney disease Father   . Hyperlipidemia Father   . Asthma Father   . COPD Father   . Heart disease Father   . HIV/AIDS Brother   . Cancer Maternal Grandmother        breast  . Breast cancer Maternal Grandmother   . Hypertension Son     ALLERGIES:  has No Known Allergies.  MEDICATIONS:  Current Outpatient Prescriptions  Medication Sig Dispense Refill  . ALPRAZolam (XANAX) 0.5 MG tablet Take 2 tablets (1 mg total) by mouth 3 (three) times daily as needed for anxiety. 30 tablet 1  . atenolol (TENORMIN) 100 MG tablet Take 1 tablet (100 mg total) by mouth daily. 30 tablet 1  . escitalopram (LEXAPRO) 10 MG tablet TAKE 1 TABLET(10 MG) BY MOUTH DAILY 30 tablet 11  . gabapentin (NEURONTIN) 400 MG capsule Take 1 capsule (400 mg total) by mouth 3 (three) times daily. 60 capsule 0  . lisinopril (PRINIVIL,ZESTRIL) 10 MG tablet TAKE 1 TABLET(10 MG) BY MOUTH DAILY 30 tablet 11  . Loperamide HCl (IMODIUM PO) Take 2 tablets by mouth daily.    . ondansetron (ZOFRAN) 4 MG tablet Take 1 tablet (4 mg  total) by mouth every 8 (eight) hours as needed for nausea or vomiting. 20 tablet 2  . oxyCODONE-acetaminophen (ROXICET) 5-325 MG tablet Take 1 tablet by mouth every 4 (four) hours as needed for severe pain. 40 tablet 0  . rOPINIRole (REQUIP) 2 MG tablet 1-2 tabs po qhs 60 tablet 3   No current facility-administered medications for this visit.     REVIEW OF SYSTEMS:    10 Point review of Systems was done is negative except as noted above.  PHYSICAL EXAMINATION: ECOG PERFORMANCE STATUS: 0 - Asymptomatic  . Vitals:   11/08/16 1352  BP: (!) 142/76  Pulse: 94  Resp: (!) 18   Filed Weights   11/08/16 1352  Weight: 259 lb 8 oz (117.7 kg)   .Body mass index is 38.32 kg/m.  GENERAL:alert, in no acute distress and comfortable SKIN: no acute rashes, no significant lesions EYES: conjunctiva are pink and non-injected, sclera anicteric OROPHARYNX: MMM, no  exudates, no oropharyngeal erythema or ulceration NECK: supple, no JVD LYMPH:  no palpable lymphadenopathy in the cervical, axillary or inguinal regions BREAST: Healing surgical incisions no active drainage , surgical drains have been removed .no palpable regional lymph nodes  LUNGS: clear to auscultation b/l with normal respiratory effort HEART: regular rate & rhythm ABDOMEN:  normoactive bowel sounds , non tender, not distended. Extremity: no pedal edema PSYCH: alert & oriented x 3 with fluent speech NEURO: no focal motor/sensory deficits  LABORATORY DATA:  I have reviewed the data as listed  . CBC Latest Ref Rng & Units 11/08/2016 10/24/2016 10/17/2016  WBC 4.0 - 10.5 K/uL 11.0(H) 10.2 12.3(H)  Hemoglobin 12.0 - 15.0 g/dL 12.3 11.0(L) 13.3  Hematocrit 36.0 - 46.0 % 37.0 34.0(L) 40.1  Platelets 150 - 400 K/uL 386 299 334    . CMP Latest Ref Rng & Units 11/08/2016 10/24/2016 10/17/2016  Glucose 65 - 99 mg/dL 107(H) 135(H) 94  BUN 6 - 20 mg/dL '12 16 11  '$ Creatinine 0.44 - 1.00 mg/dL 0.63 0.56 0.57  Sodium 135 - 145 mmol/L 136  134(L) 134(L)  Potassium 3.5 - 5.1 mmol/L 4.2 4.3 4.4  Chloride 101 - 111 mmol/L 102 102 100(L)  CO2 22 - 32 mmol/L '24 25 23  '$ Calcium 8.9 - 10.3 mg/dL 9.1 8.7(L) 9.5  Total Protein 6.5 - 8.1 g/dL 7.3 - 7.3  Total Bilirubin 0.3 - 1.2 mg/dL 0.3 - 0.3  Alkaline Phos 38 - 126 U/L 56 - 65  AST 15 - 41 U/L 30 - 26  ALT 14 - 54 U/L 30 - 28            RADIOGRAPHIC STUDIES: I have personally reviewed the radiological images as listed and agreed with the findings in the report. Chest 2 View  Result Date: 10/17/2016 CLINICAL DATA:  Preop for right breast cancer, cough. EXAM: CHEST  2 VIEW COMPARISON:  None. FINDINGS: The heart size and mediastinal contours are within normal limits. Both lungs are clear. The visualized skeletal structures are unremarkable. IMPRESSION: No active cardiopulmonary disease. Electronically Signed   By: Marijo Conception, M.D.   On: 10/17/2016 14:25    ASSESSMENT & PLAN:   46 year old Caucasian female with  #1 newly diagnosed multifocal right-sided invasive ductal carcinoma (atleast stage IIA mpT2, pNx, Mx) grade 1-2 strongly ER/PR positive HER-2/neu negative with a Ki-67 of 10% . Associated with low-grade DCIS. Patient is premenopausal. Status post right-sided modified radical mastectomy and left simple mastectomy . Unfortunately did not have any sentinel lymph node sampling accurately to determine nodal staging . No focal symptoms suggestive of metastatic disease.  #2 right and left breast fibroadenomas.  #3 submucosal fundal fibroid and right ovarian hemorrhagic cyst -follows with GYN .  #4 history of hepatitis C notes that she had only 3 weeks of treatment - which seems suboptimal. We'll repeat labs to evaluate the status of her hepatitis C .  Plan  -The diagnosis, prognosis and NCCN guidelines with regard to staging, treatment, and follow-up were discussed in extensive detail . -Patient and her family had a large number of questions which were answered  in details to their apparent satisfaction. -We discussed that she would need to follow-up with Dr. Arnoldo Morale to consider sentinel lymph node biopsy since this will help with more accurate lymph node staging and if lymph node positivity is noted might lead to consideration of axillary lymph node dissection on the right . -Patient will need consideration of adjuvant chemotherapy given her  young age at presentation multifocality and stage of tumor larger tumor . TC vs AC->T. patient notes that she would want to go with an aggressive option and consider chemotherapy and has decided upon this and so an Oncotype DX testing at this time will not change recommendations. -Will need endocrine therapy with tamoxifen until she becomes menopausal followed by an AI.  -Will check hep C viral loads to ensure SVR status since this might have a bearing on acceptable chemotherapy options . -Echo  -Genetic counseling referral provided  -Patient will return to clinic after completed evaluation by Dr. Arnoldo Morale to finalize adjuvant treatment plan.   Labs today ECHO Refer to genetic counseling. Return in 2 weeks for follow up with Dr. Artist Pais  . Orders Placed This Encounter  Procedures  . CBC with Differential/Platelet    Standing Status:   Future    Number of Occurrences:   1    Standing Expiration Date:   11/08/2017  . Comprehensive metabolic panel    Standing Status:   Future    Number of Occurrences:   1    Standing Expiration Date:   11/08/2017  . Hepatitis C antibody    Standing Status:   Future    Number of Occurrences:   1    Standing Expiration Date:   11/08/2017  . HCV RNA quant rflx ultra or genotyp    Standing Status:   Future    Number of Occurrences:   1    Standing Expiration Date:   11/08/2017  . Ambulatory referral to Social Work    Referral Priority:   Routine    Referral Type:   Consultation    Referral Reason:   Specialty Services Required    Number of Visits Requested:   1  .  ECHOCARDIOGRAM COMPLETE    Standing Status:   Future    Number of Occurrences:   1    Standing Expiration Date:   02/08/2018    Order Specific Question:   Where should this test be performed    Answer:   [facility]    Order Specific Question:   Perflutren DEFINITY (image enhancing agent) should be administered unless hypersensitivity or allergy exist    Answer:   Administer Perflutren    Order Specific Question:   Expected Date:    Answer:   1 week    All of the patients questions were answered with apparent satisfaction. The patient knows to call the clinic  with any problems, questions or concerns.  I spent 60 minutes counseling the patient face to face. The total time spent in the appointment was 80 minutes and more than 50% was on counseling and direct patient cares.    Sullivan Lone MD [place] AAHIVMS [facility] Hematology/Oncology Physician [facility]  (Office):       [phone] (Work cell):  [phone] (Fax):           [phone]  11/08/2016 1:35 PM

## 2016-11-09 LAB — HEPATITIS C ANTIBODY

## 2016-11-10 LAB — HCV RNA QUANT RFLX ULTRA OR GENOTYP
HCV RNA QNT(LOG COPY/ML): UNDETERMINED {Log_IU}/mL
HepC Qn: NOT DETECTED IU/mL

## 2016-11-12 ENCOUNTER — Encounter: Payer: Self-pay | Admitting: *Deleted

## 2016-11-12 NOTE — Progress Notes (Signed)
[place] Psychosocial Distress Screening Clinical Social Work  Clinical Social Work was referred by distress screening protocol.  The patient scored a 10 on the Psychosocial Distress Thermometer which indicates severe distress. Clinical Social Worker reviewed chart and phoned pt to assess for distress and other psychosocial needs. CSW introduced self, explained role of CSW/Pt and Family Support Team, support groups and other resources to assist. Pt reports concerns about finances as she begins treatment soon. CSW reviewed options for assistance through [facility] in [place]. Pt reports she has contacted Du[facility]ick as well and plans to attend their groups as well. CSW encouraged her to attend support group at [facility] and possible counseling as needed for anxiety and depression. Pt is on medication and feels that is helping, but is open to additional support as well. Pt plans to contact CSW as needed.    ONCBCN DISTRESS SCREENING 11/08/2016  Screening Type Initial Screening  Distress experienced in past week (1-10) 10  Practical problem type Work/school  Emotional problem type Depression;Nervousness/Anxiety;Feeling hopeless;Boredom  Spiritual/Religous concerns type Loss of sense of purpose  Physical Problem type Pain;Nausea/vomiting;Constipation/diarrhea    Clinical Social Worker follow up needed: Yes.    If yes, follow up plan: Help with resource applications as needed.  Loren Racer, LCSW, OSW-C Clinical Social Worker [place]  [place] Phone: [phone] Fax: [phone]

## 2016-11-14 ENCOUNTER — Other Ambulatory Visit: Payer: Self-pay | Admitting: General Surgery

## 2016-11-14 ENCOUNTER — Ambulatory Visit (HOSPITAL_COMMUNITY)
Admission: RE | Admit: 2016-11-14 | Discharge: 2016-11-14 | Disposition: A | Discharge status: Home / Self Care | Payer: BLUE CROSS/BLUE SHIELD | Source: Ambulatory Visit | Attending: Hematology | Admitting: Hematology

## 2016-11-14 DIAGNOSIS — I119 Hypertensive heart disease without heart failure: Secondary | ICD-10-CM | POA: Diagnosis not present

## 2016-11-14 DIAGNOSIS — Z9013 Acquired absence of bilateral breasts and nipples: Secondary | ICD-10-CM | POA: Insufficient documentation

## 2016-11-14 DIAGNOSIS — C50911 Malignant neoplasm of unspecified site of right female breast: Secondary | ICD-10-CM

## 2016-11-14 DIAGNOSIS — I358 Other nonrheumatic aortic valve disorders: Secondary | ICD-10-CM | POA: Insufficient documentation

## 2016-11-14 DIAGNOSIS — C50411 Malignant neoplasm of upper-outer quadrant of right female breast: Secondary | ICD-10-CM | POA: Insufficient documentation

## 2016-11-14 NOTE — Progress Notes (Signed)
*  PRELIMINARY RESULTS* Echocardiogram 2D Echocardiogram has been performed.  Susan Benton 11/14/2016, 10:37 AM

## 2016-11-18 ENCOUNTER — Encounter (HOSPITAL_COMMUNITY): Payer: Self-pay

## 2016-11-18 ENCOUNTER — Encounter (HOSPITAL_COMMUNITY)
Admission: RE | Admit: 2016-11-18 | Discharge: 2016-11-18 | Disposition: A | Discharge status: Home / Self Care | Payer: BLUE CROSS/BLUE SHIELD | Source: Ambulatory Visit | Attending: General Surgery | Admitting: General Surgery

## 2016-11-18 NOTE — H&P (Signed)
Susan Benton is an 46 y.o. female.   Chief Complaint: Right breast cancer HPI: Patient is a 46 year old white female who underwent a right modified radical mastectomy for right breast cancer as well as a prophylactic left simple mastectomy who now presents for a sentinel lymph node biopsy of the right axilla. No lymph nodes were found at the time of original surgery.  Past Medical History:  Diagnosis Date  . Arthritis   . Depression 2013  . Fibroids, submucosal 10/04/2016  . Hep C w/o coma, chronic ([place]) 06/27/2014  . Hx of right and left heart catheterization 2014  . Hypertension 1987  . Invasive ductal carcinoma of breast ([place]) 10/14/2016    Past Surgical History:  Procedure Laterality Date  . CARDIAC CATHETERIZATION    . DILITATION & CURRETTAGE/HYSTROSCOPY WITH NOVASURE ABLATION N/A 10/23/2016   Procedure: DILATATION & CURETTAGE/HYSTEROSCOPY WITH NOVASURE ENDOMETRIAL ABLATION;  Surgeon: Florian Buff, MD;  Location: [facility];  Service: Gynecology;  Laterality: N/A;  . MASTECTOMY MODIFIED RADICAL Right 10/23/2016   Procedure: MASTECTOMY MODIFIED RADICAL;  Surgeon: Aviva Signs, MD;  Location: [facility];  Service: General;  Laterality: Right;  . SIMPLE MASTECTOMY WITH AXILLARY SENTINEL NODE BIOPSY Left 10/23/2016   Procedure: SIMPLE MASTECTOMY;  Surgeon: Aviva Signs, MD;  Location: [facility];  Service: General;  Laterality: Left;  . TONSILECTOMY, ADENOIDECTOMY, BILATERAL MYRINGOTOMY AND TUBES Bilateral 1984  . TUBAL LIGATION  1998   2000    Family History  Problem Relation Age of Onset  . Hypertension Mother   . Cancer Mother   . Diabetes Father   . Hypertension Father   . Kidney disease Father   . Hyperlipidemia Father   . Asthma Father   . COPD Father   . Heart disease Father   . HIV/AIDS Brother   . Cancer Maternal Grandmother        breast  . Breast cancer Maternal Grandmother   . Hypertension Son    Social History:  reports that she quit smoking about 3 months ago. Her  smoking use included Cigarettes. She has a 5.00 pack-year smoking history. She has never used smokeless tobacco. She reports that she does not drink alcohol or use drugs.  Allergies: No Known Allergies  No prescriptions prior to admission.    No results found for this or any previous visit (from the past 48 hour(s)). No results found.  Review of Systems  Constitutional: Negative.   HENT: Negative.   Eyes: Negative.   Respiratory: Negative.   Cardiovascular: Negative.   Gastrointestinal: Negative.   Genitourinary: Negative.   Skin: Negative.   Neurological: Negative.   Endo/Heme/Allergies: Negative.   Psychiatric/Behavioral: Negative.     There were no vitals taken for this visit. Physical Exam  Vitals reviewed. Constitutional: She is oriented to person, place, and time. She appears well-developed and well-nourished.  HENT:  Head: Normocephalic and atraumatic.  Neck: Normal range of motion. Neck supple.  Cardiovascular: Normal rate, regular rhythm and normal heart sounds.   No murmur heard. Respiratory: Effort normal and breath sounds normal. She has no wheezes. She has no rales.  Neurological: She is alert and oriented to person, place, and time.  Skin: Skin is warm and dry.   Breast:  Bilateral mastectomy sites healing well. No axillary lymphadenopathy noted.  Assessment/Plan Impression: Right breast cancer, need for complete staging Plan: Patient will undergo right sentinel axillary lymph node biopsy on 11/20/2016. The risks and benefits of the procedure were fully explained to the patient, who  gave informed consent.  Aviva Signs, MD 11/18/2016, 8:40 AM

## 2016-11-19 ENCOUNTER — Ambulatory Visit: Payer: BLUE CROSS/BLUE SHIELD | Admitting: General Surgery

## 2016-11-20 ENCOUNTER — Ambulatory Visit (HOSPITAL_COMMUNITY)
Admission: RE | Admit: 2016-11-20 | Discharge: 2016-11-20 | Disposition: A | Discharge status: Home / Self Care | Payer: BLUE CROSS/BLUE SHIELD | Source: Ambulatory Visit | Attending: General Surgery | Admitting: General Surgery

## 2016-11-20 ENCOUNTER — Encounter (HOSPITAL_COMMUNITY)
Admission: RE | Disposition: A | Discharge status: Home / Self Care | Payer: Self-pay | Source: Ambulatory Visit | Attending: General Surgery

## 2016-11-20 ENCOUNTER — Ambulatory Visit (HOSPITAL_COMMUNITY): Payer: BLUE CROSS/BLUE SHIELD | Admitting: Anesthesiology

## 2016-11-20 ENCOUNTER — Encounter (HOSPITAL_COMMUNITY): Payer: Self-pay | Admitting: *Deleted

## 2016-11-20 DIAGNOSIS — C50411 Malignant neoplasm of upper-outer quadrant of right female breast: Secondary | ICD-10-CM | POA: Diagnosis not present

## 2016-11-20 DIAGNOSIS — B182 Chronic viral hepatitis C: Secondary | ICD-10-CM | POA: Insufficient documentation

## 2016-11-20 DIAGNOSIS — C50911 Malignant neoplasm of unspecified site of right female breast: Secondary | ICD-10-CM | POA: Diagnosis not present

## 2016-11-20 DIAGNOSIS — I10 Essential (primary) hypertension: Secondary | ICD-10-CM | POA: Diagnosis not present

## 2016-11-20 DIAGNOSIS — F17210 Nicotine dependence, cigarettes, uncomplicated: Secondary | ICD-10-CM | POA: Insufficient documentation

## 2016-11-20 DIAGNOSIS — Z79899 Other long term (current) drug therapy: Secondary | ICD-10-CM | POA: Insufficient documentation

## 2016-11-20 DIAGNOSIS — R590 Localized enlarged lymph nodes: Secondary | ICD-10-CM | POA: Diagnosis not present

## 2016-11-20 DIAGNOSIS — F329 Major depressive disorder, single episode, unspecified: Secondary | ICD-10-CM | POA: Diagnosis not present

## 2016-11-20 DIAGNOSIS — C50912 Malignant neoplasm of unspecified site of left female breast: Secondary | ICD-10-CM | POA: Diagnosis not present

## 2016-11-20 HISTORY — PX: AXILLARY SENTINEL NODE BIOPSY: SHX5738

## 2016-11-20 SURGERY — BIOPSY, LYMPH NODE, SENTINEL, AXILLARY
Anesthesia: General | Laterality: Right

## 2016-11-20 MED ORDER — HYDROMORPHONE HCL 1 MG/ML IJ SOLN
0.2500 mg | INTRAMUSCULAR | Status: DC | PRN
  Administered 2016-11-20: 0.5 mg via INTRAVENOUS

## 2016-11-20 MED ORDER — TECHNETIUM TC 99M SULFUR COLLOID FILTERED
0.5000 | Freq: Once | INTRAVENOUS | Status: AC | PRN
  Administered 2016-11-20: 0.53 via INTRADERMAL

## 2016-11-20 MED ORDER — MIDAZOLAM HCL 2 MG/2ML IJ SOLN
1.0000 mg | Freq: Once | INTRAMUSCULAR | Status: AC | PRN
  Administered 2016-11-20: 2 mg via INTRAVENOUS

## 2016-11-20 MED ORDER — METHYLENE BLUE 0.5 % INJ SOLN
INTRAVENOUS | Status: DC | PRN
  Administered 2016-11-20: 5 mL

## 2016-11-20 MED ORDER — DEXAMETHASONE SODIUM PHOSPHATE 4 MG/ML IJ SOLN
INTRAMUSCULAR | Status: AC
  Filled 2016-11-20: qty 1

## 2016-11-20 MED ORDER — CEFAZOLIN SODIUM-DEXTROSE 2-4 GM/100ML-% IV SOLN
2.0000 g | INTRAVENOUS | Status: AC
  Administered 2016-11-20: 2 g via INTRAVENOUS
  Filled 2016-11-20: qty 100

## 2016-11-20 MED ORDER — PROMETHAZINE HCL 25 MG/ML IJ SOLN
6.2500 mg | INTRAMUSCULAR | Status: DC | PRN
  Administered 2016-11-20: 6.25 mg via INTRAVENOUS
  Filled 2016-11-20: qty 1

## 2016-11-20 MED ORDER — LACTATED RINGERS IV SOLN
INTRAVENOUS | Status: DC
  Administered 2016-11-20: 12:00:00 via INTRAVENOUS

## 2016-11-20 MED ORDER — HYDROCODONE-ACETAMINOPHEN 7.5-325 MG PO TABS: 1.0000 | ORAL_TABLET | Freq: Once | ORAL | Status: DC | PRN

## 2016-11-20 MED ORDER — SODIUM CHLORIDE 0.9 % IJ SOLN
INTRAMUSCULAR | Status: AC
  Filled 2016-11-20: qty 10

## 2016-11-20 MED ORDER — CHLORHEXIDINE GLUCONATE CLOTH 2 % EX PADS: 6.0000 | MEDICATED_PAD | Freq: Once | CUTANEOUS | Status: DC

## 2016-11-20 MED ORDER — MIDAZOLAM HCL 2 MG/2ML IJ SOLN
INTRAMUSCULAR | Status: AC
  Filled 2016-11-20: qty 2

## 2016-11-20 MED ORDER — BUPIVACAINE HCL 0.5 % IJ SOLN
INTRAMUSCULAR | Status: DC | PRN
  Administered 2016-11-20: 17 mL

## 2016-11-20 MED ORDER — LIDOCAINE HCL (PF) 1 % IJ SOLN
INTRAMUSCULAR | Status: AC
  Filled 2016-11-20: qty 5

## 2016-11-20 MED ORDER — PROPOFOL 10 MG/ML IV BOLUS
INTRAVENOUS | Status: AC
  Filled 2016-11-20: qty 20

## 2016-11-20 MED ORDER — ONDANSETRON HCL 4 MG/2ML IJ SOLN
INTRAMUSCULAR | Status: DC | PRN
  Administered 2016-11-20: 4 mg via INTRAVENOUS

## 2016-11-20 MED ORDER — HEMOSTATIC AGENTS (NO CHARGE) OPTIME
TOPICAL | Status: DC | PRN
  Administered 2016-11-20: 1 via TOPICAL

## 2016-11-20 MED ORDER — METHYLENE BLUE 0.5 % INJ SOLN
INTRAVENOUS | Status: AC
  Filled 2016-11-20: qty 10

## 2016-11-20 MED ORDER — DEXAMETHASONE SODIUM PHOSPHATE 4 MG/ML IJ SOLN
INTRAMUSCULAR | Status: DC | PRN
  Administered 2016-11-20: 4 mg via INTRAVENOUS

## 2016-11-20 MED ORDER — LIDOCAINE HCL 1 % IJ SOLN
INTRAMUSCULAR | Status: DC | PRN
  Administered 2016-11-20: 30 mg via INTRADERMAL

## 2016-11-20 MED ORDER — KETOROLAC TROMETHAMINE 30 MG/ML IJ SOLN
30.0000 mg | Freq: Once | INTRAMUSCULAR | Status: AC
  Administered 2016-11-20: 30 mg via INTRAVENOUS
  Filled 2016-11-20: qty 1

## 2016-11-20 MED ORDER — BUPIVACAINE HCL (PF) 0.5 % IJ SOLN
INTRAMUSCULAR | Status: AC
  Filled 2016-11-20: qty 30

## 2016-11-20 MED ORDER — PROPOFOL 10 MG/ML IV BOLUS
INTRAVENOUS | Status: DC | PRN
  Administered 2016-11-20: 200 mg via INTRAVENOUS
  Administered 2016-11-20: 100 mg via INTRAVENOUS

## 2016-11-20 MED ORDER — SODIUM CHLORIDE 0.9% FLUSH
INTRAVENOUS | Status: AC
  Filled 2016-11-20: qty 10

## 2016-11-20 MED ORDER — FENTANYL CITRATE (PF) 100 MCG/2ML IJ SOLN
INTRAMUSCULAR | Status: AC
  Filled 2016-11-20: qty 4

## 2016-11-20 MED ORDER — FENTANYL CITRATE (PF) 100 MCG/2ML IJ SOLN
INTRAMUSCULAR | Status: DC | PRN
  Administered 2016-11-20: 50 ug via INTRAVENOUS
  Administered 2016-11-20 (×2): 100 ug via INTRAVENOUS

## 2016-11-20 MED ORDER — EPHEDRINE SULFATE 50 MG/ML IJ SOLN
INTRAMUSCULAR | Status: DC | PRN
  Administered 2016-11-20 (×2): 5 mg via INTRAVENOUS

## 2016-11-20 MED ORDER — HYDROMORPHONE HCL 1 MG/ML IJ SOLN
INTRAMUSCULAR | Status: AC
  Filled 2016-11-20: qty 1

## 2016-11-20 MED ORDER — FENTANYL CITRATE (PF) 100 MCG/2ML IJ SOLN
25.0000 ug | INTRAMUSCULAR | Status: DC | PRN
  Administered 2016-11-20 (×3): 50 ug via INTRAVENOUS
  Filled 2016-11-20: qty 2

## 2016-11-20 MED ORDER — PENTAFLUOROPROP-TETRAFLUOROETH EX AERO
INHALATION_SPRAY | CUTANEOUS | Status: AC
  Filled 2016-11-20: qty 103.5

## 2016-11-20 MED ORDER — FENTANYL CITRATE (PF) 100 MCG/2ML IJ SOLN
INTRAMUSCULAR | Status: AC
  Filled 2016-11-20: qty 2

## 2016-11-20 MED ORDER — EPHEDRINE SULFATE 50 MG/ML IJ SOLN
INTRAMUSCULAR | Status: AC
  Filled 2016-11-20: qty 1

## 2016-11-20 MED ORDER — ONDANSETRON HCL 4 MG/2ML IJ SOLN
INTRAMUSCULAR | Status: AC
  Filled 2016-11-20: qty 2

## 2016-11-20 MED ORDER — OXYCODONE-ACETAMINOPHEN 5-325 MG PO TABS: 1.0000 | ORAL_TABLET | ORAL | 0 refills | Status: DC | PRN

## 2016-11-20 MED ORDER — SODIUM CHLORIDE 0.9 % IJ SOLN
INTRAMUSCULAR | Status: DC | PRN
  Administered 2016-11-20: 5 mL via INTRAVENOUS

## 2016-11-20 MED ORDER — MIDAZOLAM HCL 2 MG/2ML IJ SOLN
INTRAMUSCULAR | Status: AC
Start: 2016-11-20 — End: 2016-11-20
  Filled 2016-11-20: qty 2

## 2016-11-20 SURGICAL SUPPLY — 37 items
APPLIER CLIP 11 MED OPEN (CLIP)
APPLIER CLIP 9.375 SM OPEN (CLIP) ×2
BAG HAMPER (MISCELLANEOUS) ×2 IMPLANT
BLADE 15 SAFETY STRL DISP (BLADE) ×2 IMPLANT
BNDG CONFORM 6X.82 1P STRL (GAUZE/BANDAGES/DRESSINGS) IMPLANT
CLIP APPLIE 11 MED OPEN (CLIP) IMPLANT
CLIP APPLIE 9.375 SM OPEN (CLIP) ×1 IMPLANT
CLOTH BEACON ORANGE TIMEOUT ST (SAFETY) ×2 IMPLANT
CNTNR SPEC C3OZ STD GRAD LEK (MISCELLANEOUS) ×1 IMPLANT
CONT SPEC 3OZ W/LID STRL (MISCELLANEOUS) ×1
COVER LIGHT HANDLE STERIS (MISCELLANEOUS) ×4 IMPLANT
DURAPREP 26ML APPLICATOR (WOUND CARE) ×2 IMPLANT
ELECT REM PT RETURN 9FT ADLT (ELECTROSURGICAL) ×2
ELECTRODE REM PT RTRN 9FT ADLT (ELECTROSURGICAL) ×1 IMPLANT
EVACUATOR DRAINAGE 10X20 100CC (DRAIN) IMPLANT
EVACUATOR SILICONE 100CC (DRAIN)
FORMALIN 10 PREFIL 480ML (MISCELLANEOUS) IMPLANT
GAUZE SPONGE 4X4 12PLY STRL (GAUZE/BANDAGES/DRESSINGS) ×2 IMPLANT
GLOVE BIOGEL PI IND STRL 7.0 (GLOVE) ×1 IMPLANT
GLOVE BIOGEL PI INDICATOR 7.0 (GLOVE) ×1
GLOVE SURG SS PI 7.5 STRL IVOR (GLOVE) ×2 IMPLANT
GOWN STRL REUS W/TWL LRG LVL3 (GOWN DISPOSABLE) ×6 IMPLANT
INST SET MINOR GENERAL (KITS) ×2 IMPLANT
KIT ROOM TURNOVER APOR (KITS) ×2 IMPLANT
MANIFOLD NEPTUNE II (INSTRUMENTS) ×2 IMPLANT
NS IRRIG 1000ML POUR BTL (IV SOLUTION) ×2 IMPLANT
PACK MINOR (CUSTOM PROCEDURE TRAY) ×2 IMPLANT
PAD ARMBOARD 7.5X6 YLW CONV (MISCELLANEOUS) ×2 IMPLANT
SET BASIN LINEN APH (SET/KITS/TRAYS/PACK) ×2 IMPLANT
SPONGE INTESTINAL PEANUT (DISPOSABLE) ×2 IMPLANT
SPONGE LAP 18X18 X RAY DECT (DISPOSABLE) ×4 IMPLANT
STOCKINETTE IMPERVIOUS LG (DRAPES) ×2 IMPLANT
STRIP CLOSURE SKIN 1/2X4 (GAUZE/BANDAGES/DRESSINGS) IMPLANT
SUT ETHILON 3 0 FSL (SUTURE) IMPLANT
SUT VIC AB 3-0 SH 27 (SUTURE) ×1
SUT VIC AB 3-0 SH 27X BRD (SUTURE) ×1 IMPLANT
SUT VIC AB 4-0 PS2 27 (SUTURE) ×2 IMPLANT

## 2016-11-20 NOTE — Anesthesia Preprocedure Evaluation (Signed)
Anesthesia Evaluation    Airway Mallampati: I  TM Distance: >3 FB Neck ROM: Full    Dental  (+) Teeth Intact   Pulmonary former smoker,    Pulmonary exam normal        Cardiovascular hypertension, Pt. on home beta blockers Normal cardiovascular exam Rhythm:Regular Rate:Normal     Neuro/Psych Depression    GI/Hepatic (+) Hepatitis -  Endo/Other    Renal/GU      Musculoskeletal  (+) Arthritis ,   Abdominal Normal abdominal exam  (+)   Peds  Hematology   Anesthesia Other Findings   Reproductive/Obstetrics                            Anesthesia Physical Anesthesia Plan  ASA: III  Anesthesia Plan: General   Post-op Pain Management:    Induction: Intravenous  PONV Risk Score and Plan: Ondansetron  Airway Management Planned: LMA  Additional Equipment:   Intra-op Plan:   Post-operative Plan: Extubation in OR  Informed Consent:   Plan Discussed with: CRNA  Anesthesia Plan Comments:         Anesthesia Quick Evaluation

## 2016-11-20 NOTE — Anesthesia Postprocedure Evaluation (Signed)
Anesthesia Post Note  Patient: Susan Benton  Procedure(s) Performed: Procedure(s) (LRB): AXILLARY SENTINEL NODE BIOPSY (Right)  Patient location during evaluation: PACU Anesthesia Type: General Level of consciousness: awake and alert Pain management: satisfactory to patient Vital Signs Assessment: post-procedure vital signs reviewed and stable Respiratory status: spontaneous breathing Cardiovascular status: stable Anesthetic complications: no     Last Vitals:  Vitals:   11/20/16 1527 11/20/16 1530  BP:  (!) 107/51  Pulse: 70 68  Resp: 17 12  Temp:      Last Pain:  Vitals:   11/20/16 1530  TempSrc:   PainSc: 6                  Elester Apodaca

## 2016-11-20 NOTE — Interval H&P Note (Signed)
History and Physical Interval Note:  11/20/2016 10:48 AM  Susan Benton  has presented today for surgery, with the diagnosis of right breast cancer  The various methods of treatment have been discussed with the patient and family. After consideration of risks, benefits and other options for treatment, the patient has consented to  Procedure(s) with comments: [name] (Right) - Sentinel Node Injection @ 10:30am as a surgical intervention .  The patient's history has been reviewed, patient examined, no change in status, stable for surgery.  I have reviewed the patient's chart and labs.  Questions were answered to the patient's satisfaction.     Aviva Signs

## 2016-11-20 NOTE — Discharge Instructions (Signed)
Sentinel Lymph Node Biopsy, Care After Refer to this sheet in the next few weeks. These instructions provide you with information on caring for yourself after your procedure. Your health care provider may also give you more specific instructions. Your treatment has been planned according to current medical practices, but problems sometimes occur. Call your health care provider if you have any problems or questions after your procedure. What can I expect after the procedure? After your procedure, it is typical to have the following:  Your urine may be blue for the next 24 hours. This is normal. It is caused by the dye used during the procedure.  Your skin at the injection site may be blue for up to 8 weeks.  You may feel numbness, tingling, or pain near your surgical incision.  You may have swelling or bruising near your incision.  Follow these instructions at home:  Avoid vigorous exercise. Ask your health care provider when you can return to your normal activities.  You may shower 24 hours after your procedure. It is okay to get your incision wet. Pat the area dry with a clean towel. Do not rub the incision because this may cause bleeding.  Women who are given a surgical bra should wear it for the next 48 hours. The bra may be removed to shower.  There are many different ways to close and cover an incision, including stitches, skin glue, and adhesive strips. Follow your health care provider's instructions on: ? Incision care. ? Bandage (dressing) changes and removal. ? Incision closure removal.  Take medicines only as directed by your health care provider.  You may resume your regular diet.  Do not have your blood pressure taken in the arm on the side of the biopsy until your health care provider says it is okay.  Keep all follow-up visits as directed by your health care provider. This is important. Contact a health care provider if:  Your pain medicine is not helping.  You have  nausea and vomiting.  You have any new swelling, bruising, or redness.  You have chills or a fever. Get help right away if:  You have pain that is getting worse, and your medicine is not helping.  You have redness, swelling, or tenderness that is getting worse.  You have bleeding or drainage from your incision site.  You have vomiting that will not stop.  You have chest pain or trouble breathing. This information is not intended to replace advice given to you by your health care provider. Make sure you discuss any questions you have with your health care provider. Document Released: 12/26/2003 Document Revised: 10/19/2015 Document Reviewed: 07/02/2013 Elsevier Interactive Patient Education  2018 [place] POST-ANESTHESIA  IMMEDIATELY FOLLOWING SURGERY:  Do not drive or operate machinery for the first twenty four hours after surgery.  Do not make any important decisions for twenty four hours after surgery or while taking narcotic pain medications or sedatives.  If you develop intractable nausea and vomiting or a severe headache please notify your doctor immediately.  FOLLOW-UP:  Please make an appointment with your surgeon as instructed. You do not need to follow up with anesthesia unless specifically instructed to do so.  WOUND CARE INSTRUCTIONS (if applicable):  Keep a dry clean dressing on the anesthesia/puncture wound site if there is drainage.  Once the wound has quit draining you may leave it open to air.  Generally you should leave the bandage intact for twenty four hours  unless there is drainage.  If the epidural site drains for more than 36-48 hours please call the anesthesia department.  QUESTIONS?:  Please feel free to call your physician or the hospital operator if you have any questions, and they will be happy to assist you.

## 2016-11-20 NOTE — Op Note (Signed)
Patient:  Susan Benton  DOB:  1970/12/24  MRN:  [id number]   Preop Diagnosis:  Right breast carcinoma  Postop Diagnosis:  Same  Procedure:  Sentinel lymph node biopsy, right axilla  Surgeon:  Aviva Signs, M.D.  Anes:  Gen.  Indications:  Patient is a 46 year old white female recently diagnosed with a right breast cancer, status post right modified radical mastectomy 2 weeks ago who now presents for a sentinel lymph node biopsy of the right axilla. No lymph nodes were found at the time of the original surgery. The risks and benefits of the procedure including bleeding, infection, and the possibility of needing a complete dissection were fully explained to the patient, who gave informed consent.  Procedure note:  The patient was placed in the supine position. She had already undergone radioactive nuclide injection approximately 2 hours earlier. After general anesthesia was administered, half strength methylene blue was injected superiorly and laterally subdermally from the right mastectomy site. 5 mL was injected and was massaged into the soft tissue for 5 minutes. The right breast and axilla were then prepped and draped using usual sterile technique with DuraPrep. Surgical site confirmation was performed.  A small incision was made in the right axilla. This was taken down to the axillary contents. No significant radioactivity was noted using the neoprobe. On inspecting the axillary contents, 2 lymph nodes were seen and they were palpable and blue. A bleeding was controlled using small clips while these lymph nodes were removed. They were sent to pathology for frozen section. Frozen section was negative for malignancy. I did explore the rest of the right axilla and no other lymph nodes could be found. A significant amount of adipose tissue was present. Arista was placed into the axillary bed. The subcutaneous layer was reapproximated using a 3-0 Vicryl interrupted suture. 0.5% Sensorcaine was  instilled into the surrounding wound. The skin was closed using a 4-0 Vicryl subcuticular suture. Dermabond was applied.  All tape and needle counts were correct at the end the procedure. The patient was awakened and transferred to PACU in stable condition.  Complications:  None  EBL:  Minimal  Specimen:  Right axillary sentinel lymph nodes

## 2016-11-20 NOTE — Transfer of Care (Signed)
Immediate Anesthesia Transfer of Care Note  Patient: Susan Benton  Procedure(s) Performed: Procedure(s) with comments: AXILLARY SENTINEL NODE BIOPSY (Right) - Sentinel Node Injection @ 10:30am  Patient Location: PACU  Anesthesia Type:General  Level of Consciousness: awake, alert , oriented and patient cooperative  Airway & Oxygen Therapy: Patient Spontanous Breathing and Patient connected to face mask oxygen  Post-op Assessment: Report given to RN, Post -op Vital signs reviewed and stable and Patient moving all extremities X 4  Post vital signs: Reviewed and stable  Last Vitals:  Vitals:   11/20/16 1025 11/20/16 1325  BP:  (!) 125/59  Pulse: 77 70  Resp: 18 18  Temp: 36.8 C     Last Pain:  Vitals:   11/20/16 1025  TempSrc: Oral  PainSc: 4       Patients Stated Pain Goal: 5 (62/44/69 5072)  Complications: No apparent anesthesia complications

## 2016-11-20 NOTE — Anesthesia Procedure Notes (Signed)
Procedure Name: LMA Insertion Date/Time: 11/20/2016 1:42 PM Performed by: Charmaine Downs Pre-anesthesia Checklist: Patient identified, Patient being monitored, Emergency Drugs available, Timeout performed and Suction available Patient Re-evaluated:Patient Re-evaluated prior to inductionOxygen Delivery Method: Circle System Utilized Preoxygenation: Pre-oxygenation with 100% oxygen Intubation Type: IV induction Ventilation: Mask ventilation without difficulty LMA: LMA inserted LMA Size: 4.0 Number of attempts: 1 Placement Confirmation: positive ETCO2 and breath sounds checked- equal and bilateral Dental Injury: Teeth and Oropharynx as per pre-operative assessment

## 2016-11-21 ENCOUNTER — Encounter (HOSPITAL_COMMUNITY): Payer: Self-pay | Admitting: General Surgery

## 2016-11-25 ENCOUNTER — Encounter (HOSPITAL_COMMUNITY): Payer: BLUE CROSS/BLUE SHIELD | Attending: Oncology | Admitting: Oncology

## 2016-11-25 ENCOUNTER — Encounter (HOSPITAL_COMMUNITY): Payer: Self-pay

## 2016-11-25 VITALS — BP 144/84 | HR 77 | Temp 98.0°F | Resp 16 | Wt 261.6 lb

## 2016-11-25 DIAGNOSIS — Z9013 Acquired absence of bilateral breasts and nipples: Secondary | ICD-10-CM | POA: Diagnosis not present

## 2016-11-25 DIAGNOSIS — Z170 Estrogen receptor positive status [ER+]: Secondary | ICD-10-CM | POA: Diagnosis not present

## 2016-11-25 DIAGNOSIS — N83201 Unspecified ovarian cyst, right side: Secondary | ICD-10-CM

## 2016-11-25 DIAGNOSIS — C50411 Malignant neoplasm of upper-outer quadrant of right female breast: Secondary | ICD-10-CM | POA: Insufficient documentation

## 2016-11-25 DIAGNOSIS — G8918 Other acute postprocedural pain: Secondary | ICD-10-CM | POA: Diagnosis not present

## 2016-11-25 DIAGNOSIS — B1920 Unspecified viral hepatitis C without hepatic coma: Secondary | ICD-10-CM | POA: Diagnosis not present

## 2016-11-25 DIAGNOSIS — C50911 Malignant neoplasm of unspecified site of right female breast: Secondary | ICD-10-CM

## 2016-11-25 MED ORDER — OXYCODONE-ACETAMINOPHEN 5-325 MG PO TABS: 1.0000 | ORAL_TABLET | ORAL | 0 refills | Status: DC | PRN

## 2016-11-25 MED ORDER — ONDANSETRON HCL 4 MG PO TABS: 4.0000 mg | ORAL_TABLET | Freq: Three times a day (TID) | ORAL | 2 refills | Status: DC | PRN

## 2016-11-25 NOTE — Progress Notes (Signed)
[facility]    HEMATOLOGY/ONCOLOGY CONSULTATION NOTE  Date of Service: 11/25/2016  Patient Care Team: Patient, No Pcp Per as PCP - General (General Practice) Twana First, MD as Consulting Physician (Oncology)  CHIEF COMPLAINTS/PURPOSE OF CONSULTATION:  Newly diagnosed Breast Cancer   HISTORY OF PRESENTING ILLNESS:  Susan Benton is a wonderful 46 y.o. female who has been referred to [place] by Dr Arnoldo Morale for evaluation and management of newly diagnosed breast cancer.  Patient has a history of hypertension, hepatitis C [treated with Harvoni for 3 weeks?], ex-smoker quit 3-4 months ago who has been referred to [place] for newly diagnosed breast cancer. Patient apparently felt a lump in the right breast and had a mammogram with her GYN practitioner on 10/01/2016 which showed an irregular spiculated mass at the 10:00 position 7 cm from the nipple measuring 2 x 1.5 x 1 cm and a circumscribed hypoechoic gently lobulated mass at 3 o'clock position 8 cm from the nipple measuring 1.4 x 0.9 x 1.0 cm. This corresponds to the mass seen on mammography and has imaging features most suggestive of a benign fibroadenoma. Ultrasound of the left breast shows a slightly hypoechoic/nearly isoechoic circumscribed oval mass at 12 o'clock position 4 cm from nipple measuring 1.0 x 0.6 x 0.7 cm, corresponding to the probable degenerating fibroadenoma seen on mammography.  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  Patient was relatively Dr. Arnoldo Morale and elected to have a right-sided modified radical mastectomy and left simple mastectomy. This was done on 10/23/2016.  Right breast modified radical mastectomy pathology revealed - MULTIFOCAL INVASIVE DUCTAL CARCINOMA, GRADE 1, SPANNING 4.6 CM AND 0.6 CM.  LOW GRADE DUCTAL CARCINOMA IN SITU. RESECTION MARGINS ARE NEGATIVE. FIBROADENOMAS.  MpT2, pNx,  Mx. Unfortunately no lymph nodes were sampled and so lymph node staging was inadequate. Mammogram and ultrasound did not show any clinical lymphadenopathy.  Left breast simple mastectomy -- FIBROADENOMA.  FIBROADENOMATOID CHANGE.  SCLEROSING ADENOSIS. - FIBROCYSTIC CHANGE.  Patient was sent to [place] for further evaluation regarding adjuvant treatments. She notes that her drains are out and her skin incisions are healing well. She is quite anxious and is accompanied by her husband and daughter.  She notes that her maternal grandmother had breast cancer at age 31 and maternal cousin had breast cancer at age 64 years. She notes no bone pains no shortness of breath or chest pain no focal neurological deficits no abdominal pain.  Patient reports that she had a history of hepatitis C but was only treated for 3 weeks with Harvoni which is somewhat unusual. No active follow-up for this currently.  Ex-smoker quit about 3-4 months ago.   INTERVAL HISTORY: Patient presents today for continued follow-up. Since her last visit she's undergone sentinel lymph node biopsy on 11/20/16 and zero out of one lymph nodes was positive for malignancy. She states that she's been having a lot of pain at her SLN biopsy site and was told by her surgeon Dr. Arnoldo Morale that he may have possibly nicked a nerve. She has been taking Percocets 5 mg/325 mg 2 tabs every 4 hours for pain control. She asked for refill of her pain medications today. Otherwise patient has been going through a lot of emotional stress at home, since now she is separated from her husband who has relapsed into alcoholism.  MEDICAL HISTORY:  Past Medical History:  Diagnosis Date  .  Arthritis   . Depression 2013  . Fibroids, submucosal 10/04/2016  . Hep C w/o coma, chronic ([place]) 06/27/2014  . Hx of right and left heart catheterization 2014  . Hypertension 1987  . Invasive ductal carcinoma of breast ([place]) 10/14/2016    SURGICAL HISTORY: Past Surgical  History:  Procedure Laterality Date  . AXILLARY SENTINEL NODE BIOPSY Right 11/20/2016   Procedure: AXILLARY SENTINEL NODE BIOPSY;  Surgeon: Aviva Signs, MD;  Location: [facility];  Service: General;  Laterality: Right;  Sentinel Node Injection @ 10:30am  . CARDIAC CATHETERIZATION    . DILITATION & CURRETTAGE/HYSTROSCOPY WITH NOVASURE ABLATION N/A 10/23/2016   Procedure: DILATATION & CURETTAGE/HYSTEROSCOPY WITH NOVASURE ENDOMETRIAL ABLATION;  Surgeon: Florian Buff, MD;  Location: [facility];  Service: Gynecology;  Laterality: N/A;  . MASTECTOMY MODIFIED RADICAL Right 10/23/2016   Procedure: MASTECTOMY MODIFIED RADICAL;  Surgeon: Aviva Signs, MD;  Location: [facility];  Service: General;  Laterality: Right;  . SIMPLE MASTECTOMY WITH AXILLARY SENTINEL NODE BIOPSY Left 10/23/2016   Procedure: SIMPLE MASTECTOMY;  Surgeon: Aviva Signs, MD;  Location: [facility];  Service: General;  Laterality: Left;  . TONSILECTOMY, ADENOIDECTOMY, BILATERAL MYRINGOTOMY AND TUBES Bilateral 1984  . TUBAL LIGATION  1998   2000    SOCIAL HISTORY: Social History   Social History  . Marital status: Married    Spouse name: N/A  . Number of children: N/A  . Years of education: N/A   Occupational History  . Not on file.   Social History Main Topics  . Smoking status: Former Smoker    Packs/day: 0.25    Years: 20.00    Types: Cigarettes    Quit date: 07/20/2016  . Smokeless tobacco: Never Used     Comment: 2 per day  . Alcohol use No  . Drug use: No  . Sexual activity: Yes    Birth control/ protection: Surgical     Comment: tubal   Other Topics Concern  . Not on file   Social History Narrative  . No narrative on file  Patient is a registered nurse  FAMILY HISTORY: Family History  Problem Relation Age of Onset  . Hypertension Mother   . Cancer Mother   . Diabetes Father   . Hypertension Father   . Kidney disease Father   . Hyperlipidemia Father   . Asthma Father   . COPD Father   . Heart disease Father    . HIV/AIDS Brother   . Cancer Maternal Grandmother        breast  . Breast cancer Maternal Grandmother   . Hypertension Son     ALLERGIES:  has No Known Allergies.  MEDICATIONS:  Current Outpatient Prescriptions  Medication Sig Dispense Refill  . ALPRAZolam (XANAX) 0.5 MG tablet Take 2 tablets (1 mg total) by mouth 3 (three) times daily as needed for anxiety. 30 tablet 1  . atenolol (TENORMIN) 100 MG tablet Take 1 tablet (100 mg total) by mouth daily. 30 tablet 1  . escitalopram (LEXAPRO) 10 MG tablet TAKE 1 TABLET(10 MG) BY MOUTH DAILY 30 tablet 11  . gabapentin (NEURONTIN) 400 MG capsule Take 1 capsule (400 mg total) by mouth 3 (three) times daily. 60 capsule 0  . lisinopril (PRINIVIL,ZESTRIL) 10 MG tablet TAKE 1 TABLET(10 MG) BY MOUTH DAILY 30 tablet 11  . Loperamide HCl (IMODIUM PO) Take 2 tablets by mouth daily.    . ondansetron (ZOFRAN) 4 MG tablet Take 1 tablet (4 mg total) by mouth every 8 (eight) hours  as needed for nausea or vomiting. 20 tablet 2  . oxyCODONE-acetaminophen (ROXICET) 5-325 MG tablet Take 1-2 tablets by mouth every 4 (four) hours as needed for severe pain. 40 tablet 0  . rOPINIRole (REQUIP) 2 MG tablet 1-2 tabs po qhs (Patient taking differently: Take 2-4 mg by mouth at bedtime. 1-2 tabs po qhs) 60 tablet 3   No current facility-administered medications for this visit.     REVIEW OF SYSTEMS:    10 Point review of Systems was done is negative except as noted above.  PHYSICAL EXAMINATION: ECOG PERFORMANCE STATUS: 0 - Asymptomatic  . Vitals:   11/08/16 1352  BP: (!) 142/76  Pulse: 94  Resp: (!) 18   Filed Weights   11/25/16 0931  Weight: 261 lb 9.6 oz (118.7 kg)   .Body mass index is 38.63 kg/m.  Physical Exam  Constitutional: She is oriented to person, place, and time and well-developed, well-nourished, and in no distress. No distress.  HENT:  Head: Normocephalic and atraumatic.  Mouth/Throat: No oropharyngeal exudate.  Eyes: Conjunctivae  are normal. Pupils are equal, round, and reactive to light. No scleral icterus.  Neck: Normal range of motion. Neck supple. No JVD present.  Cardiovascular: Normal rate, regular rhythm and normal heart sounds.  Exam reveals no gallop and no friction rub.   No murmur heard. Pulmonary/Chest: Breath sounds normal. No respiratory distress. She has no wheezes. She has no rales.    Abdominal: Soft. Bowel sounds are normal. She exhibits no distension. There is no tenderness. There is no guarding.  Musculoskeletal: She exhibits no edema or tenderness.  Lymphadenopathy:    She has no cervical adenopathy.  Neurological: She is alert and oriented to person, place, and time. No cranial nerve deficit.  Skin: Skin is warm and dry. No rash noted. No erythema. No pallor.  Psychiatric: Affect and judgment normal.     LABORATORY DATA:  I have reviewed the data as listed  . CBC Latest Ref Rng & Units 11/08/2016 10/24/2016 10/17/2016  WBC 4.0 - 10.5 K/uL 11.0(H) 10.2 12.3(H)  Hemoglobin 12.0 - 15.0 g/dL 12.3 11.0(L) 13.3  Hematocrit 36.0 - 46.0 % 37.0 34.0(L) 40.1  Platelets 150 - 400 K/uL 386 299 334    . CMP Latest Ref Rng & Units 11/08/2016 10/24/2016 10/17/2016  Glucose 65 - 99 mg/dL 107(H) 135(H) 94  BUN 6 - 20 mg/dL '12 16 11  '$ Creatinine 0.44 - 1.00 mg/dL 0.63 0.56 0.57  Sodium 135 - 145 mmol/L 136 134(L) 134(L)  Potassium 3.5 - 5.1 mmol/L 4.2 4.3 4.4  Chloride 101 - 111 mmol/L 102 102 100(L)  CO2 22 - 32 mmol/L '24 25 23  '$ Calcium 8.9 - 10.3 mg/dL 9.1 8.7(L) 9.5  Total Protein 6.5 - 8.1 g/dL 7.3 - 7.3  Total Bilirubin 0.3 - 1.2 mg/dL 0.3 - 0.3  Alkaline Phos 38 - 126 U/L 56 - 65  AST 15 - 41 U/L 30 - 26  ALT 14 - 54 U/L 30 - 28            Patient: Susan Benton, Susan Benton Collected: 11/20/2016 Client: [facility] Accession: [id number] Received: 11/20/2016 Aviva Signs DOB: 1971/05/04 Age: 3 Gender: F Reported: 11/22/2016 [address] Patient Ph: [phone] MRN #:  [id number] [place], [place] [address] Visit #: [id number] Chart #: Phone: [phone] Fax: CC: REPORT OF SURGICAL PATHOLOGY FINAL DIAGNOSIS Diagnosis Lymph node, sentinel, biopsy, right axillary - ONE OF ONE LYMPH NODES NEGATIVE FOR CARCINOMA (0/1). Vicente Males MD Pathologist, Electronic Signature  RADIOGRAPHIC STUDIES: I have personally reviewed the radiological images as listed and agreed with the findings in the report. Nm Sentinel Node Injection  Result Date: 11/20/2016 CLINICAL DATA:  RIGHT breast cancer post BILATERAL mastectomy, no lymph nodes at specimen, for preoperative sentinel node injection pre axillary lymph node dissection EXAM: NUCLEAR MEDICINE BREAST LYMPHOSCINTIGRAPHY RIGHT TECHNIQUE: Patient has had a preceding RIGHT mastectomy 3 weeks ago. The skin along the cranial margin of the transverse mastectomy scar was prepped in the usual sterile fashion. Topical anesthesia ethyl chloride spray. 1.61 millicuries of tracer were injected at 4 sites along the cranial margin of the mastectomy scar. Procedure tolerated well by patient. No immediate complication. RADIOPHARMACEUTICALS:  Total of 1 mCi Millipore-filtered Technetium-27msulfur colloid, injected in four aliquots of 0.25 mCi each. IMPRESSION: Uncomplicated intradermal injection of a total of 1 mCi Technetium-965mulfur colloid for purposes of RIGHT axillary sentinel node identification. Electronically Signed   By: MaLavonia Dana.D.   On: 11/20/2016 11:22    ASSESSMENT & PLAN:   [age]ear old Caucasian female with  #1 newly diagnosed multifocal right-sided invasive ductal carcinoma (stage IIA mpT2, pN0 (sn), Mx) grade 1-2 strongly ER/PR positive HER-2/neu negative with a Ki-67 of 10% . Associated with low-grade DCIS. Patient is premenopausal. Status post right-sided modified radical mastectomy and left simple mastectomy .  #2 right and left breast fibroadenomas.  #3 submucosal fundal fibroid and right ovarian hemorrhagic cyst  -follows with GYN .  #4 history of hepatitis C notes that she had only 3 weeks of treatment - which seems suboptimal. Defer to GI for continued management.  Plan  -The diagnosis, prognosis and NCCN guidelines with regard to staging, treatment, and follow-up were discussed in extensive detail . Her final stage is Stage IIA despite her multifocal disease.  -She had previously discussed with Dr. KaIrene Limbon her last visit that she wanted to receive aggressive treatment with chemotherapy and therefore an oncotype Dx was not done. Now that we have a negative nodal status, I have discussed with her again regarding proceeding with chemotherapy with adjuvant AC q2 weeks for 4 cycles followed by Taxol qweekly for 12 cycles. I have discussed with her the potential side effects of chemotherapy. I have also discussed doing the Oncotype Dx recurrence test with her since it may give [place] info regarding whether she would benefit from adjuvant chemo vs. Endocrine therapy alone. After a long discussion, patient stated she would rather wait for the oncotype Dx results before deciding on adjuvant chemo. She will definitely need endocrine therapy with tamoxifen until she becomes menopausal followed by an AI. I will also check the BCI test to see if she will benefit from extended endocrine therapy for 10 years.  - Refill given for percocet since she stated her pain is excruciating and she will run out of her pain meds before she gets a chance to see Dr. JeArnoldo Moralegain.  RTC in 3 weeks to review oncotype Dx and BCI results and to discuss the next plan of care.   All of the patients questions were answered with apparent satisfaction. The patient knows to call the clinic  with any problems, questions or concerns.  LoTwana FirstMD

## 2016-11-26 ENCOUNTER — Encounter: Payer: Self-pay | Admitting: General Surgery

## 2016-11-26 ENCOUNTER — Ambulatory Visit (INDEPENDENT_AMBULATORY_CARE_PROVIDER_SITE_OTHER): Payer: Self-pay | Admitting: General Surgery

## 2016-11-26 ENCOUNTER — Encounter (HOSPITAL_COMMUNITY): Payer: Self-pay | Admitting: Emergency Medicine

## 2016-11-26 ENCOUNTER — Other Ambulatory Visit (HOSPITAL_COMMUNITY): Payer: Self-pay | Admitting: Emergency Medicine

## 2016-11-26 VITALS — BP 155/93 | HR 86 | Temp 98.0°F | Resp 18 | Ht 69.0 in | Wt 261.0 lb

## 2016-11-26 DIAGNOSIS — C50411 Malignant neoplasm of upper-outer quadrant of right female breast: Secondary | ICD-10-CM

## 2016-11-26 DIAGNOSIS — Z09 Encounter for follow-up examination after completed treatment for conditions other than malignant neoplasm: Secondary | ICD-10-CM

## 2016-11-26 DIAGNOSIS — C50911 Malignant neoplasm of unspecified site of right female breast: Secondary | ICD-10-CM

## 2016-11-26 MED ORDER — ALPRAZOLAM 0.5 MG PO TABS: 1.0000 mg | ORAL_TABLET | Freq: Three times a day (TID) | ORAL | 1 refills | Status: DC | PRN

## 2016-11-26 NOTE — Progress Notes (Signed)
oncotype sent.  Fax onfirmed.  [employer] prior sent and fax confirmed.  Sent to pathology and fax confirmed.

## 2016-11-26 NOTE — Progress Notes (Signed)
Subjective:     Susan Benton  Status post right axillary sentinel lymph node biopsy. Patient doing well from surgery standpoint. She has a lot going on at home. She is about to go to court to get a restraining order against her husband. Objective:    BP (!) 155/93   Pulse 86   Temp 98 F (36.7 C)   Resp 18   Ht 5\' 9"  (1.753 m)   Wt 261 lb (118.4 kg)   BMI 38.54 kg/m   General:  alert, cooperative and no distress  Right axillary incision healing well. Final pathology revealed no malignancy in the sentinel lymph node.     Assessment:    Doing well postoperatively.    Plan:   Patient sought oncology yesterday. They're doing a further workup. I did reorder her Xanax. Follow-up here as needed.

## 2016-11-26 NOTE — Progress Notes (Signed)
Received fax from [facility] prior [place] stating that this pt did not have active policy with [employer] at this time.  Called pt and she does have this insurance.  I called the insurance benefit verification line and she does have insurance and they check and dais that she does not need prior auth for the oncotype because pt has met all of her deductibles.

## 2016-11-28 ENCOUNTER — Telehealth (HOSPITAL_COMMUNITY): Payer: Self-pay | Admitting: General Practice

## 2016-11-28 NOTE — Telephone Encounter (Signed)
11/28/16  I called patient to let her know about the OT appt and she said she isn't sure why she is getting therapy now when her surgery was 2 months ago... she doesnt think she needs it and asked me to cancel appt

## 2016-12-02 ENCOUNTER — Encounter (HOSPITAL_COMMUNITY): Payer: Self-pay | Admitting: Emergency Medicine

## 2016-12-02 NOTE — Progress Notes (Signed)
[employer] now requesting prior authorization again.  Sent once again and fax confirmed.     [employer] sent on pt to [employer] and fax confirmed.

## 2016-12-03 ENCOUNTER — Encounter (HOSPITAL_COMMUNITY): Payer: Self-pay | Admitting: Emergency Medicine

## 2016-12-03 ENCOUNTER — Emergency Department (HOSPITAL_COMMUNITY)
Admission: EM | Admit: 2016-12-03 | Discharge: 2016-12-03 | Disposition: A | Discharge status: Home / Self Care | Payer: BLUE CROSS/BLUE SHIELD | Attending: Emergency Medicine | Admitting: Emergency Medicine

## 2016-12-03 ENCOUNTER — Emergency Department (HOSPITAL_COMMUNITY): Payer: BLUE CROSS/BLUE SHIELD

## 2016-12-03 DIAGNOSIS — I10 Essential (primary) hypertension: Secondary | ICD-10-CM | POA: Insufficient documentation

## 2016-12-03 DIAGNOSIS — Z87891 Personal history of nicotine dependence: Secondary | ICD-10-CM | POA: Diagnosis not present

## 2016-12-03 DIAGNOSIS — Z79899 Other long term (current) drug therapy: Secondary | ICD-10-CM | POA: Insufficient documentation

## 2016-12-03 DIAGNOSIS — G8918 Other acute postprocedural pain: Secondary | ICD-10-CM | POA: Insufficient documentation

## 2016-12-03 DIAGNOSIS — R52 Pain, unspecified: Secondary | ICD-10-CM

## 2016-12-03 DIAGNOSIS — R0789 Other chest pain: Secondary | ICD-10-CM | POA: Diagnosis present

## 2016-12-03 LAB — CBC WITH DIFFERENTIAL/PLATELET
BASOS PCT: 0 %
Basophils Absolute: 0 10*3/uL (ref 0.0–0.1)
Eosinophils Absolute: 0.4 10*3/uL (ref 0.0–0.7)
Eosinophils Relative: 3 %
HEMATOCRIT: 40.8 % (ref 36.0–46.0)
HEMOGLOBIN: 13.7 g/dL (ref 12.0–15.0)
LYMPHS PCT: 20 %
Lymphs Abs: 2.6 10*3/uL (ref 0.7–4.0)
MCH: 27.3 pg (ref 26.0–34.0)
MCHC: 33.6 g/dL (ref 30.0–36.0)
MCV: 81.3 fL (ref 78.0–100.0)
Monocytes Absolute: 0.9 10*3/uL (ref 0.1–1.0)
Monocytes Relative: 7 %
NEUTROS PCT: 70 %
Neutro Abs: 9.1 10*3/uL — ABNORMAL HIGH (ref 1.7–7.7)
Platelets: 350 10*3/uL (ref 150–400)
RBC: 5.02 MIL/uL (ref 3.87–5.11)
RDW: 14.2 % (ref 11.5–15.5)
WBC: 13 10*3/uL — AB (ref 4.0–10.5)

## 2016-12-03 LAB — COMPREHENSIVE METABOLIC PANEL
ALBUMIN: 4.1 g/dL (ref 3.5–5.0)
ALK PHOS: 63 U/L (ref 38–126)
ALT: 33 U/L (ref 14–54)
ANION GAP: 10 (ref 5–15)
AST: 29 U/L (ref 15–41)
BILIRUBIN TOTAL: 0.2 mg/dL — AB (ref 0.3–1.2)
BUN: 13 mg/dL (ref 6–20)
CALCIUM: 9.3 mg/dL (ref 8.9–10.3)
CO2: 22 mmol/L (ref 22–32)
Chloride: 106 mmol/L (ref 101–111)
Creatinine, Ser: 0.54 mg/dL (ref 0.44–1.00)
GLUCOSE: 101 mg/dL — AB (ref 65–99)
POTASSIUM: 4.2 mmol/L (ref 3.5–5.1)
Sodium: 138 mmol/L (ref 135–145)
Total Protein: 8.3 g/dL — ABNORMAL HIGH (ref 6.5–8.1)

## 2016-12-03 MED ORDER — OXYCODONE-ACETAMINOPHEN 5-325 MG PO TABS
1.0000 | ORAL_TABLET | Freq: Four times a day (QID) | ORAL | 0 refills | Status: DC | PRN
Start: 2016-12-03 — End: 2016-12-17

## 2016-12-03 MED ORDER — HYDROGEN PEROXIDE 3 % EX SOLN
CUTANEOUS | Status: AC
  Filled 2016-12-03: qty 473

## 2016-12-03 MED ORDER — MORPHINE SULFATE (PF) 4 MG/ML IV SOLN
4.0000 mg | Freq: Once | INTRAVENOUS | Status: DC
  Filled 2016-12-03: qty 1

## 2016-12-03 MED ORDER — OXYCODONE-ACETAMINOPHEN 10-325 MG PO TABS
1.0000 | ORAL_TABLET | Freq: Four times a day (QID) | ORAL | 0 refills | Status: DC | PRN
Start: 2016-12-03 — End: 2016-12-17

## 2016-12-03 MED ORDER — MORPHINE SULFATE (PF) 4 MG/ML IV SOLN
4.0000 mg | Freq: Once | INTRAVENOUS | Status: AC
  Administered 2016-12-03: 4 mg via INTRAMUSCULAR

## 2016-12-03 MED ORDER — SODIUM CHLORIDE 0.9 % IV BOLUS (SEPSIS): 1000.0000 mL | Freq: Once | INTRAVENOUS | Status: DC

## 2016-12-03 MED ORDER — OXYCODONE-ACETAMINOPHEN 5-325 MG PO TABS: 1.0000 | ORAL_TABLET | Freq: Four times a day (QID) | ORAL | 0 refills | Status: DC | PRN

## 2016-12-03 NOTE — ED Triage Notes (Signed)
Swelling, redness and pain to rt breast area.  Post op double mastectomy in may, removed lymph nodes in June on lt side

## 2016-12-03 NOTE — ED Provider Notes (Signed)
[facility] DEPT Provider Note   CSN: [id number] Arrival date & time: 12/03/16  1624     History   Chief Complaint Chief Complaint  Patient presents with  . Post-op Problem    HPI Susan Benton is a 46 y.o. female.  HPI  Patient presents with concern of new swelling and pain. Patient has a notable history of double mastectomy one month ago, had been doing generally well until the past 2 days. She notes that over this timeframe she has developed new swelling, and pain about the right lateral anterior chest wall, near the incision site of her prior surgery. There is associated generalized discomfort, but no other focal pain, no fever, no vomiting, no chills. No new dyspnea. Patient notes no clear alleviating or exacerbating factors.  Past Medical History:  Diagnosis Date  . Arthritis   . Depression 2013  . Fibroids, submucosal 10/04/2016  . Hep C w/o coma, chronic ([place]) 06/27/2014  . Hx of right and left heart catheterization 2014  . Hypertension 1987  . 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 S/P right mastectomy 10/23/2016  . Malignant neoplasm of upper-outer quadrant of right female breast ([place])   . Family history of breast cancer   . 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 Fibroids, submucosal 10/04/2016  . Menstrual cramps 09/25/2016  . Menorrhagia with regular cycle 09/25/2016  . Mass of upper outer quadrant of right breast 09/25/2016  . Depression   . Hypertension     Past Surgical History:  Procedure Laterality Date  . AXILLARY SENTINEL NODE BIOPSY Right 11/20/2016   Procedure: AXILLARY SENTINEL NODE BIOPSY;  Surgeon: Aviva Signs, MD;  Location: [facility];  Service: General;  Laterality: Right;  Sentinel Node Injection @ 10:30am  . CARDIAC CATHETERIZATION    . DILITATION & CURRETTAGE/HYSTROSCOPY WITH NOVASURE ABLATION N/A 10/23/2016   Procedure: DILATATION &  CURETTAGE/HYSTEROSCOPY WITH NOVASURE ENDOMETRIAL ABLATION;  Surgeon: Florian Buff, MD;  Location: [facility];  Service: Gynecology;  Laterality: N/A;  . MASTECTOMY MODIFIED RADICAL Right 10/23/2016   Procedure: MASTECTOMY MODIFIED RADICAL;  Surgeon: Aviva Signs, MD;  Location: [facility];  Service: General;  Laterality: Right;  . SIMPLE MASTECTOMY WITH AXILLARY SENTINEL NODE BIOPSY Left 10/23/2016   Procedure: SIMPLE MASTECTOMY;  Surgeon: Aviva Signs, MD;  Location: [facility];  Service: General;  Laterality: Left;  . TONSILECTOMY, ADENOIDECTOMY, BILATERAL MYRINGOTOMY AND TUBES Bilateral 1984  . TUBAL LIGATION  1998   2000    OB History    Gravida Para Term Preterm AB Living   2 2           SAB TAB Ectopic Multiple Live Births                   Home Medications    Prior to Admission medications   Medication Sig Start Date End Date Taking? Authorizing Provider  ALPRAZolam Duanne Moron) 0.5 MG tablet Take 2 tablets (1 mg total) by mouth 3 (three) times daily as needed for anxiety. Patient taking differently: Take 0.5 mg by mouth 2 (two) times daily as needed for anxiety.  11/26/16  Yes Aviva Signs, MD  atenolol (TENORMIN) 100 MG tablet Take 1 tablet (100 mg total) by mouth daily. 11/05/16  Yes Aviva Signs, MD  escitalopram (LEXAPRO) 10 MG tablet TAKE 1 TABLET(10 MG) BY MOUTH DAILY 03/11/16  Yes Susy Frizzle, MD  gabapentin (NEURONTIN) 100 MG capsule  Take 200 mg by mouth at bedtime.   Yes [provider]  lisinopril (PRINIVIL,ZESTRIL) 10 MG tablet TAKE 1 TABLET(10 MG) BY MOUTH DAILY 04/30/16  Yes Susy Frizzle, MD  meloxicam (MOBIC) 15 MG tablet Take 15 mg by mouth daily as needed. 11/17/16  Yes [provider]  rOPINIRole (REQUIP) 2 MG tablet 1-2 tabs po qhs Patient taking differently: Take 2 mg by mouth at bedtime.  07/11/16  Yes Susy Frizzle, MD    Family History Family History  Problem Relation Age of Onset  . Hypertension Mother   . Cancer Mother   . Diabetes  Father   . Hypertension Father   . Kidney disease Father   . Hyperlipidemia Father   . Asthma Father   . COPD Father   . Heart disease Father   . HIV/AIDS Brother   . Cancer Maternal Grandmother        breast  . Breast cancer Maternal Grandmother   . Hypertension Son     Social History Social History  Substance Use Topics  . Smoking status: Former Smoker    Packs/day: 0.25    Years: 20.00    Types: Cigarettes    Quit date: 07/20/2016  . Smokeless tobacco: Never Used     Comment: 2 per day  . Alcohol use No     Allergies   Patient has no known allergies.   Review of Systems Review of Systems  Constitutional:       Per HPI, otherwise negative  HENT:       Per HPI, otherwise negative  Respiratory:       Per HPI, otherwise negative  Cardiovascular:       Per HPI, otherwise negative  Gastrointestinal: Negative for vomiting.  Endocrine:       Negative aside from HPI  Genitourinary:       Neg aside from HPI   Musculoskeletal:       Per HPI, otherwise negative  Skin: Positive for color change.  Allergic/Immunologic: Positive for immunocompromised state.  Neurological: Negative for syncope.     Physical Exam Updated Vital Signs BP (!) 187/110 (BP Location: Left Arm)   Pulse (!) 115   Temp 98 F (36.7 C) (Oral)   Resp 20   Ht 5\' 9"  (1.753 m)   Wt 108.9 kg (240 lb)   LMP 10/28/2016 (Approximate)   SpO2 100%   BMI 35.44 kg/m   Physical Exam  Constitutional: She is oriented to person, place, and time. She appears well-developed and well-nourished. No distress.  HENT:  Head: Normocephalic and atraumatic.  Eyes: Conjunctivae and EOM are normal.  Cardiovascular: Normal rate and regular rhythm.   Pulmonary/Chest: Effort normal and breath sounds normal. No stridor. No respiratory distress.    Abdominal: She exhibits no distension.  Musculoskeletal: She exhibits no edema.  Neurological: She is alert and oriented to person, place, and time. No cranial nerve  deficit.  Skin: Skin is warm and dry.  Psychiatric: She has a normal mood and affect.  Nursing note and vitals reviewed.    ED Treatments / Results  Labs (all labs ordered are listed, but only abnormal results are displayed) Labs Reviewed  COMPREHENSIVE METABOLIC PANEL - Abnormal; Notable for the following:       Result Value   Glucose, Bld 101 (*)    Total Protein 8.3 (*)    Total Bilirubin 0.2 (*)    All other components within normal limits  CBC WITH DIFFERENTIAL/PLATELET -  Abnormal; Notable for the following:    WBC 13.0 (*)    Neutro Abs 9.1 (*)    All other components within normal limits     Radiology Ct Chest Wo Contrast  Result Date: 12/03/2016 CLINICAL DATA:  Swelling redness and pain to the right breast and axilla recent double mastectomy in May EXAM: CT CHEST WITHOUT CONTRAST TECHNIQUE: Multidetector CT imaging of the chest was performed following the standard protocol without IV contrast. COMPARISON:  Radiograph 10/17/2016 FINDINGS: Cardiovascular: Limited evaluation without intravenous contrast. Non aneurysmal aorta. Aortic atherosclerosis. Coronary artery calcifications. Normal heart size. No pericardial effusion. Mediastinum/Nodes: Midline trachea. Esophagus within normal limits. Nonspecific subcentimeter mediastinal lymph nodes. No thyroid mass Lungs/Pleura: Lungs are clear. No pleural effusion or pneumothorax. Upper Abdomen: Hepatic steatosis. Musculoskeletal: No acute or suspicious bone lesion. The patient is status post bilateral mastectomy. Surgical clips are noted in the right axilla. Lobulated hypodensity in the right axilla measuring 2.9 x 7.7 cm, fluid density. IMPRESSION: 1. 7.7 cm lobulated fluid attenuation collection in the right axilla, could represent a postoperative fluid collection, with or without presence of infection. Further management will need to be based on clinical grounds. Could correlate with targeted sonography and aspiration as indicated. 2.  Clear lung fields 3. Hepatic steatosis Aortic Atherosclerosis (ICD10-I70.0). Electronically Signed   By: Donavan Foil M.D.   On: 12/03/2016 20:02    Procedures Procedures (including critical care time)  Medications Ordered in ED Medications  hydrogen peroxide 3 % external solution (not administered)  morphine 4 MG/ML injection 4 mg (4 mg Intramuscular Given 12/03/16 1927)     Initial Impression / Assessment and Plan / ED Course  I have reviewed the triage vital signs and the nursing notes.  Pertinent labs & imaging results that were available during my care of the patient were reviewed by me and considered in my medical decision making (see chart for details).  I discussed patient's case with her surgeon. We discussed the CT results, vital signs, physical exam, we agreed that the most likely cause of her sudden swelling is seroma. With no fever, no superficial erythema, infectious collection is less likely. Patient will follow up with the surgeon in the office.  On repeat exam patient appears generally well, remains afebrile, appears more calm. I discussed all findings with her and her husband. She is amenable to outpatient follow-up, and given the absence of fever, no indication for admission, antibiotics. Patient will receive additional analgesia on discharge.  Final Clinical Impressions(s) / ED Diagnoses   Final diagnoses:  Pain     Carmin Muskrat, MD 12/03/16 2122

## 2016-12-03 NOTE — ED Notes (Signed)
2 unsuccessful PIV attempts . EDP notified. Order given to change Morphine 4mg  IV to IM route.

## 2016-12-03 NOTE — ED Notes (Signed)
Patient transported to CT 

## 2016-12-03 NOTE — Discharge Instructions (Signed)
As discussed, today's evaluation has been generally reassuring. You are lesion is likely a seroma, or fluid collection. It is important to monitor your condition carefully, use cold compresses, and follow up with your surgeon. If you develop new, or concerning changes, return here.

## 2016-12-04 ENCOUNTER — Encounter (HOSPITAL_COMMUNITY): Payer: Self-pay | Admitting: Emergency Medicine

## 2016-12-04 NOTE — Progress Notes (Signed)
More pathology information sent to [facility] with prior auth dept with [employer].

## 2016-12-05 ENCOUNTER — Encounter (HOSPITAL_COMMUNITY): Payer: BLUE CROSS/BLUE SHIELD | Admitting: Genetic Counselor

## 2016-12-06 ENCOUNTER — Encounter (HOSPITAL_COMMUNITY): Payer: Self-pay

## 2016-12-09 ENCOUNTER — Telehealth (HOSPITAL_COMMUNITY): Payer: Self-pay | Admitting: Emergency Medicine

## 2016-12-09 MED FILL — Oxycodone w/ Acetaminophen Tab 5-325 MG: ORAL | Qty: 6 | Status: AC

## 2016-12-09 NOTE — Telephone Encounter (Signed)
left a message for pt to call me back about getting her rescheduled for genetic testing in august.

## 2016-12-11 ENCOUNTER — Encounter: Payer: Self-pay | Admitting: *Deleted

## 2016-12-16 NOTE — Progress Notes (Signed)
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  ER+ (100%), PR+ (100%), Ki67 10%, HER2 neg (ratio 1.03).       10/23/2016 Surgery    Bilateral mastectomies Arnoldo Morale)      10/23/2016 Pathology Results    RIGHT mastectomy: Multifocal IDC, grade 1, spanning 4.6 cm & 0.6 cm. Low grade DCIS. Negative margins. HER2 repeated and neg (ratio 1.36).  LEFT mastectomy: Fibroadenoma, no malignancy.       11/20/2016 Surgery    (R) axillary sentinel lymph node biopsy Arnoldo Morale)      11/20/2016 Pathology Results    0/1 axillary sentinel LN biopsy for carcinoma.       11/28/2016 Oncotype testing    Recurrence score: 9 (low risk); associated with 6% risk of distant recurrence.       12/03/2016 Imaging    CT chest: IMPRESSION: 1. 7.7 cm lobulated fluid attenuation collection in the right axilla, could represent a postoperative fluid collection, with or without presence of infection. Further management will need to be based on clinical grounds. Could correlate with targeted sonography and aspiration as indicated. 2. Clear lung fields 3. Hepatic steatosis 4. Aortic Atherosclerosis        INTERVAL HISTORY:  [name] 46 y.o. female returns for follow-up for right breast cancer.   She is here today with her husband.   Overall, she has been feeling "okay." Reports having "no energy at all."  Appetite is 100%.  Her  biggest complaint is (R) chest wall pain. States that she saw Dr. Arnoldo Morale recently and was told that it was likely scar tissue forming causing "my muscle to get pulled."  She is requesting a refill of Percocet, which she takes PRN for chest wall pain.  Also notes to have new (R) chest wall wound that she noticed after her sentinel lymph node biopsy surgery on 11/20/16. Reports recent green drainage from the wound; denies any pain to this area. Denies fever/chills.    She reports "my nerves have been bad lately." Currently on Lexapro. Struggles with hot flashes as well. She was started on gabapentin 200 mg at bedtime for her breast pain initially; remains on gabapentin.  Endorses some vaginal spotting, but denies normal menses. She did undergo endometrial ablation at the time of her mastectomies in 10/2016.    She tells me she is scheduled to see Dr. Marla Roe with Plastic Surgery soon.      REVIEW OF SYSTEMS:  Review of Systems  Constitutional: Positive for fatigue. Negative for chills and fever.  HENT:  Negative.   Eyes: Negative.   Respiratory: Negative.  Negative for cough and shortness of breath.   Cardiovascular: Negative.  Negative for chest pain and leg swelling.  Gastrointestinal: Negative.  Negative for abdominal pain, blood in stool, constipation, diarrhea, nausea and vomiting.  Endocrine: Positive for hot flashes.  Musculoskeletal: Negative.   Skin: Positive for wound.  Neurological:  Negative.   Hematological: Negative.   Psychiatric/Behavioral: The patient is nervous/anxious.      PAST MEDICAL/SURGICAL HISTORY:  Past Medical History:  Diagnosis Date  . Arthritis   . Depression 2013  . Fibroids, submucosal 10/04/2016  . Hep C w/o coma, chronic ([place]) 06/27/2014  . Hx of right and left heart catheterization 2014  . Hypertension 1987  . Invasive ductal carcinoma of breast ([facility]) 10/14/2016   Past Surgical History:  Procedure Laterality Date  . AXILLARY SENTINEL NODE BIOPSY  Right 11/20/2016   Procedure: AXILLARY SENTINEL NODE BIOPSY;  Surgeon: Aviva Signs, MD;  Location: [facility];  Service: General;  Laterality: Right;  Sentinel Node Injection @ 10:30am  . CARDIAC CATHETERIZATION    . DILITATION & CURRETTAGE/HYSTROSCOPY WITH NOVASURE ABLATION N/A 10/23/2016   Procedure: DILATATION & CURETTAGE/HYSTEROSCOPY WITH NOVASURE ENDOMETRIAL ABLATION;  Surgeon: Florian Buff, MD;  Location: [facility];  Service: Gynecology;  Laterality: N/A;  . MASTECTOMY MODIFIED RADICAL Right 10/23/2016   Procedure: MASTECTOMY MODIFIED RADICAL;  Surgeon: Aviva Signs, MD;  Location: [facility];  Service: General;  Laterality: Right;  . SIMPLE MASTECTOMY WITH AXILLARY SENTINEL NODE BIOPSY Left 10/23/2016   Procedure: SIMPLE MASTECTOMY;  Surgeon: Aviva Signs, MD;  Location: [facility];  Service: General;  Laterality: Left;  . TONSILECTOMY, ADENOIDECTOMY, BILATERAL MYRINGOTOMY AND TUBES Bilateral 1984  . TUBAL LIGATION  1998   2000     SOCIAL HISTORY:  Social History   Social History  . Marital status: Married    Spouse name: N/A  . Number of children: N/A  . Years of education: N/A   Occupational History  . Not on file.   Social History Main Topics  . Smoking status: Former Smoker    Packs/day: 0.25    Years: 20.00    Types: Cigarettes    Quit date: 07/20/2016  . Smokeless tobacco: Never Used     Comment: 2 per day  . Alcohol use No  . Drug use: No  . Sexual activity: Yes    Birth control/ protection: Surgical     Comment: tubal   Other Topics Concern  . Not on file   Social History Narrative  . No narrative on file    FAMILY HISTORY:  Family History  Problem Relation Age of Onset  . Hypertension Mother   . Cancer Mother   . Diabetes Father   . Hypertension Father   . Kidney disease Father   . Hyperlipidemia Father   . Asthma Father   . COPD Father   . Heart disease Father   . HIV/AIDS Brother   . Cancer Maternal Grandmother        breast  . Breast cancer Maternal  Grandmother   . Hypertension Son     CURRENT MEDICATIONS:  Outpatient Encounter Prescriptions as of 12/17/2016  Medication Sig Note  . ALPRAZolam (XANAX) 0.5 MG tablet Take 2 tablets (1 mg total) by mouth 3 (three) times daily as needed for anxiety. (Patient taking differently: Take 0.5 mg by mouth 2 (two) times daily as needed for anxiety. ) 12/03/2016: Patient took 0.'25mg'$  today at 2:00pm  . atenolol (TENORMIN) 100 MG tablet Take 1 tablet (100 mg total) by mouth daily.   Marland Kitchen escitalopram (LEXAPRO) 10 MG tablet TAKE 1 TABLET(10 MG) BY MOUTH DAILY   . gabapentin (NEURONTIN) 100 MG capsule Take 200 mg by mouth at bedtime.   Marland Kitchen lisinopril (PRINIVIL,ZESTRIL) 10 MG tablet TAKE 1 TABLET(10 MG) BY MOUTH DAILY   . meloxicam (MOBIC)  15 MG tablet Take 15 mg by mouth daily as needed.   Marland Kitchen oxyCODONE-acetaminophen (PERCOCET/ROXICET) 5-325 MG tablet Take 1 tablet by mouth every 6 (six) hours as needed for severe pain.   Marland Kitchen rOPINIRole (REQUIP) 2 MG tablet 1-2 tabs po qhs (Patient taking differently: Take 2 mg by mouth at bedtime. )   . [DISCONTINUED] oxyCODONE-acetaminophen (PERCOCET) 10-325 MG tablet Take 1 tablet by mouth every 6 (six) hours as needed for pain.   . [DISCONTINUED] oxyCODONE-acetaminophen (PERCOCET/ROXICET) 5-325 MG tablet Take 1 tablet by mouth every 6 (six) hours as needed for severe pain.   Marland Kitchen sulfamethoxazole-trimethoprim (BACTRIM DS,SEPTRA DS) 800-160 MG tablet Take 1 tablet by mouth 2 (two) times daily.   Marland Kitchen venlafaxine (EFFEXOR) 37.5 MG tablet Take 1 tablet (37.5 mg total) by mouth daily. Take 1 tab daily x 2 weeks. Then take 2 tabs (75 mg) once daily thereafter.   . [DISCONTINUED] sulfamethoxazole-trimethoprim (BACTRIM DS,SEPTRA DS) 800-160 MG tablet Take 1 tablet by mouth 2 (two) times daily.    No facility-administered encounter medications on file as of 12/17/2016.     ALLERGIES:  No Known Allergies   PHYSICAL EXAM:  ECOG Performance status: 1 - Symptomatic; remains independent.    Vitals:   12/17/16 0947  BP: 132/82  Pulse: 80  Resp: 18   Filed Weights   12/17/16 0947  Weight: 256 lb (116.1 kg)    Physical Exam  Constitutional: She is oriented to person, place, and time and well-developed, well-nourished, and in no distress.  HENT:  Head: Normocephalic.  Mouth/Throat: Oropharynx is clear and moist. No oropharyngeal exudate.  Eyes: Pupils are equal, round, and reactive to light. Conjunctivae are normal. No scleral icterus.  Neck: Normal range of motion. Neck supple.  Cardiovascular: Normal rate and regular rhythm.   Pulmonary/Chest: Effort normal and breath sounds normal. No respiratory distress. She has no wheezes. She has no rales.  Abdominal: Soft. Bowel sounds are normal. There is no tenderness. There is no rebound and no guarding.  Musculoskeletal: Normal range of motion. She exhibits no edema.  Lymphadenopathy:    She has no cervical adenopathy.  Neurological: She is alert and oriented to person, place, and time. No cranial nerve deficit. Gait normal.  Skin: Skin is warm and dry. No rash noted.  (R) chest wound/ulceration measures about 0.5 cm with purulent drainage. Not painful or warm to touch. No associated induration. (see photo below)  Psychiatric: Mood, memory, affect and judgment normal.  Nursing note and vitals reviewed.   *Photo of patient's (R) chest wall taken with patient's verbal permission. Photo taken in the presence of Forest Gleason, RN.     LABORATORY DATA:  I have reviewed the labs as listed.  CBC    Component Value Date/Time   WBC 10.2 12/17/2016 1045   RBC 5.10 12/17/2016 1045   HGB 13.6 12/17/2016 1045   HCT 41.0 12/17/2016 1045   PLT 326 12/17/2016 1045   MCV 80.4 12/17/2016 1045   MCH 26.7 12/17/2016 1045   MCHC 33.2 12/17/2016 1045   RDW 13.9 12/17/2016 1045   LYMPHSABS 2.0 12/17/2016 1045   MONOABS 0.6 12/17/2016 1045   EOSABS 0.3 12/17/2016 1045   BASOSABS 0.0 12/17/2016 1045   CMP Latest Ref Rng & Units  12/17/2016 12/03/2016 11/08/2016  Glucose 65 - 99 mg/dL 93 101(H) 107(H)  BUN 6 - 20 mg/dL '12 13 12  '$ Creatinine 0.44 - 1.00 mg/dL 0.55 0.54 0.63  Sodium 135 - 145 mmol/L 136 138 136  Potassium 3.5 - 5.1 mmol/L 4.3 4.2 4.2  Chloride 101 - 111 mmol/L 103 106 102  CO2 22 - 32 mmol/L '23 22 24  '$ Calcium 8.9 - 10.3 mg/dL 9.3 9.3 9.1  Total Protein 6.5 - 8.1 g/dL 7.7 8.3(H) 7.3  Total Bilirubin 0.3 - 1.2 mg/dL 0.6 0.2(L) 0.3  Alkaline Phos 38 - 126 U/L 65 63 56  AST 15 - 41 U/L '25 29 30  '$ ALT 14 - 54 U/L 28 33 30    PENDING LABS:    DIAGNOSTIC IMAGING:  *The following radiologic images and reports have been reviewed independently and agree with below findings.  CT chest: 12/03/16     PATHOLOGY:  Bilateral mastectomies surgical path: 10/23/16         (R) axillary sentinel lymph node biopsy: 11/20/16        Oncotype DX: 11/28/16         ASSESSMENT & PLAN:   Stage IA invasive ductal carcinoma of right breast, ER+/PR+/HER2-: -Diagnosed in 09/2016. Patient elected to have bilateral mastectomies with Dr. Arnoldo Morale on 10/23/16, along with D&C/hysteroscopy with ablation (endometrial path benign); she later had right axillary sentinel lymph node biopsy on 11/20/16 and 0/1 LNs.   -Explained Oncotype DX score 9 (low risk), indicating no benefit from chemotherapy. We reviewed these results today in detail.  -Given large size of tumor (4.6 cm) & multifocal right breast cancer, we discussed referral to radiation oncology for consultation/consideration of post-mastectomy radiation.  Discussed options for radiation location including [facility] in [facility] vs [facility] in [place]. She elects to go to [facility] given location.  -She is scheduled to see Dr. Marla Roe with Plastic Surgery for consideration of breast reconstruction.  -If Dr. Quitman Livings at [facility] does not recommend adjuvant chest wall radiation, then we will start anti-estrogen therapy next.  If  she requires radiation therapy, we will initiate anti-estrogen therapy post-radiation.  -She has had endometrial ablation recently, so may be difficult to assess her menopausal status without labs. Will check FSH, LH, and estradiol to see if she is menopausal; I suspect she will be pre-menopausal and thus Tamoxifen will be the best choice of therapy for her. We discussed, in detail, the differences between Tamoxifen and aromatase inhibitors in terms of mechanism of action and possible side effects.  She understands that once she goes through menopause, then we will need to transition her to aromatase inhibitor. Planned duration of therapy 5-10 years. Can collect Breast Cancer Index testing in the future, if indicated.   -Refer to [facility] for post-mastectomy radiation consideration.   -Return to cancer center in 6 weeks for follow-up.   (R) chest wall wound/(R) chest wall fluid collection noted on recent CT:  -Wound likely secondary to mastectomy and/or sentinel lymph node injection.   -Green/purulent discharge noted from wound (see photo). No surrounding induration.   -CT chest reviewed from ED visit on 12/03/16; there appears to still be fluid collection to UOQ of right chest wall, but appears to be much smaller than 7 cm on exam today (which is encouraging).    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 E-scribed Bactrim DS 1 tab BID x 5 days to her pharmacy. If wound does not heal, then will refer back to Dr. Arnoldo Morale for further consideration.     Breast pain:  -Likely d/t recent mastectomy.   -  [place] Controlled Substance Reporting System reviewed and refill of Percocet is appropriate.  Continuing opiates is appropriate for now. Paper prescription given to patient today.     Hot flashes/Anxiety/Depression:  -Continue gabapentin as prescribed by outside  provider; she is currently taking 200 mg at bedtime.  -Discussed the option of replacing her Lexapro with Effexor XR. Effexor XR has been shown to help women with both hot flashes, as well as mood stabilization.  -Recommended she stop Lexapro to prevent serotonin syndrome. E-scribed Effexor XR 37.5 mg to her pharmacy; gave her instructions to take 1 capsule once daily x 14 days, then increase to 2 capsules (total dose 75 mg) daily.  Effexor XR is also a safe & great medication to take in conjunction with Tamoxifen, when it is time for her to start anti-estrogen therapy. She agreed with this plan.   Social issues:  -Reportedly has been having marital issues (based on previous documentation), with concerns for safety (recent reports of restraining order against her husband).  Husband was present during today's visit and was largely appropriate in his behavior. However, I did make our social worker, Loren Racer, LCSW aware so that she may follow the patient for these and other potential social concerns.      Dispo:  -See Dr. Marla Roe with Plastic Surgery, as scheduled.  -Refer to [facility] for consideration for post-mastectomy radiation. -Return to cancer center in 6 weeks for continued follow-up.    All questions were answered to patient's stated satisfaction. Encouraged patient to call with any new concerns or questions before her next visit to the cancer center and we can certain see her sooner, if needed.    Plan of care discussed with Dr. Talbert Cage, who agrees with the above aforementioned.    A total of 40 minutes was spent in face-to-face care of this patient, with greater than 50% of that time spent in counseling and care-coordination.    Orders placed this encounter:  Orders Placed This Encounter  Procedures  . FSH/LH  . Estradiol  . CBC with Differential/Platelet  . Comprehensive metabolic panel      Mike Craze, NP [place] [phone]

## 2016-12-17 ENCOUNTER — Encounter: Payer: Self-pay | Admitting: *Deleted

## 2016-12-17 ENCOUNTER — Encounter (HOSPITAL_BASED_OUTPATIENT_CLINIC_OR_DEPARTMENT_OTHER): Payer: BLUE CROSS/BLUE SHIELD | Admitting: Adult Health

## 2016-12-17 ENCOUNTER — Ambulatory Visit (HOSPITAL_COMMUNITY): Payer: BLUE CROSS/BLUE SHIELD

## 2016-12-17 ENCOUNTER — Encounter (HOSPITAL_COMMUNITY): Payer: Self-pay | Admitting: Adult Health

## 2016-12-17 ENCOUNTER — Encounter (HOSPITAL_COMMUNITY): Payer: BLUE CROSS/BLUE SHIELD

## 2016-12-17 VITALS — BP 132/82 | HR 80 | Resp 18 | Ht 69.0 in | Wt 256.0 lb

## 2016-12-17 DIAGNOSIS — R0789 Other chest pain: Secondary | ICD-10-CM | POA: Diagnosis not present

## 2016-12-17 DIAGNOSIS — S21101D Unspecified open wound of right front wall of thorax without penetration into thoracic cavity, subsequent encounter: Secondary | ICD-10-CM

## 2016-12-17 DIAGNOSIS — N951 Menopausal and female climacteric states: Secondary | ICD-10-CM | POA: Diagnosis not present

## 2016-12-17 DIAGNOSIS — C50411 Malignant neoplasm of upper-outer quadrant of right female breast: Secondary | ICD-10-CM | POA: Diagnosis not present

## 2016-12-17 DIAGNOSIS — R232 Flushing: Secondary | ICD-10-CM

## 2016-12-17 DIAGNOSIS — T8189XA Other complications of procedures, not elsewhere classified, initial encounter: Secondary | ICD-10-CM

## 2016-12-17 DIAGNOSIS — F419 Anxiety disorder, unspecified: Secondary | ICD-10-CM

## 2016-12-17 DIAGNOSIS — N644 Mastodynia: Secondary | ICD-10-CM

## 2016-12-17 DIAGNOSIS — F418 Other specified anxiety disorders: Secondary | ICD-10-CM | POA: Diagnosis not present

## 2016-12-17 LAB — CBC WITH DIFFERENTIAL/PLATELET
Basophils Absolute: 0 10*3/uL (ref 0.0–0.1)
Basophils Relative: 0 %
Eosinophils Absolute: 0.3 10*3/uL (ref 0.0–0.7)
Eosinophils Relative: 3 %
HEMATOCRIT: 41 % (ref 36.0–46.0)
HEMOGLOBIN: 13.6 g/dL (ref 12.0–15.0)
LYMPHS ABS: 2 10*3/uL (ref 0.7–4.0)
Lymphocytes Relative: 19 %
MCH: 26.7 pg (ref 26.0–34.0)
MCHC: 33.2 g/dL (ref 30.0–36.0)
MCV: 80.4 fL (ref 78.0–100.0)
MONOS PCT: 6 %
Monocytes Absolute: 0.6 10*3/uL (ref 0.1–1.0)
NEUTROS ABS: 7.3 10*3/uL (ref 1.7–7.7)
NEUTROS PCT: 72 %
Platelets: 326 10*3/uL (ref 150–400)
RBC: 5.1 MIL/uL (ref 3.87–5.11)
RDW: 13.9 % (ref 11.5–15.5)
WBC: 10.2 10*3/uL (ref 4.0–10.5)

## 2016-12-17 LAB — COMPREHENSIVE METABOLIC PANEL
ALT: 28 U/L (ref 14–54)
AST: 25 U/L (ref 15–41)
Albumin: 4 g/dL (ref 3.5–5.0)
Alkaline Phosphatase: 65 U/L (ref 38–126)
Anion gap: 10 (ref 5–15)
BUN: 12 mg/dL (ref 6–20)
CHLORIDE: 103 mmol/L (ref 101–111)
CO2: 23 mmol/L (ref 22–32)
Calcium: 9.3 mg/dL (ref 8.9–10.3)
Creatinine, Ser: 0.55 mg/dL (ref 0.44–1.00)
GFR calc Af Amer: >60 mL/min (ref >60)
GFR calc non Af Amer: >60 mL/min (ref >60)
GLUCOSE: 93 mg/dL (ref 65–99)
POTASSIUM: 4.3 mmol/L (ref 3.5–5.1)
Sodium: 136 mmol/L (ref 135–145)
Total Bilirubin: 0.6 mg/dL (ref 0.3–1.2)
Total Protein: 7.7 g/dL (ref 6.5–8.1)

## 2016-12-17 MED ORDER — OXYCODONE-ACETAMINOPHEN 5-325 MG PO TABS: 1.0000 | ORAL_TABLET | Freq: Four times a day (QID) | ORAL | 0 refills | Status: DC | PRN

## 2016-12-17 MED ORDER — SULFAMETHOXAZOLE-TRIMETHOPRIM 800-160 MG PO TABS: 1.0000 | ORAL_TABLET | Freq: Two times a day (BID) | ORAL | 0 refills | Status: DC

## 2016-12-17 MED ORDER — VENLAFAXINE HCL 37.5 MG PO TABS: 37.5000 mg | ORAL_TABLET | Freq: Every day | ORAL | 1 refills | Status: DC

## 2016-12-17 NOTE — Patient Instructions (Addendum)
[place] at [facility] Discharge Instructions  RECOMMENDATIONS MADE BY THE CONSULTANT AND ANY TEST RESULTS WILL BE SENT TO YOUR REFERRING PHYSICIAN.  You were seen today by Mike Craze NP. Labs today, we will call you with those results. Rx for antibiotics sent to your pharmacy. Rx for Effexor given today, start taking 37.5mg  daily for 2 weeks, then move up to 75mg  daily. Rx for for Percocet given today as well. Referral sent for radiation. Return in 6 weeks for follow up.    Thank you for choosing [place] at [facility] to provide your oncology and hematology care.  To afford each patient quality time with our provider, please arrive at least 15 minutes before your scheduled appointment time.    If you have a lab appointment with the [facility] please come in thru the  Main Entrance and check in at the main information desk  You need to re-schedule your appointment should you arrive 10 or more minutes late.  We strive to give you quality time with our providers, and arriving late affects you and other patients whose appointments are after yours.  Also, if you no show three or more times for appointments you may be dismissed from the clinic at the providers discretion.     Again, thank you for choosing [facility].  Our hope is that these requests will decrease the amount of time that you wait before being seen by our physicians.       _____________________________________________________________  Should you have questions after your visit to [facility], please contact our office at [phone] between the hours of 8:30 a.m. and 4:30 p.m.  Voicemails left after 4:30 p.m. will not be returned until the following business day.  For prescription refill requests, have your pharmacy contact our office.       Resources For Cancer Patients and their Caregivers ? American Cancer Society: Can assist with  transportation, wigs, general needs, runs Look Good Feel Better.        639-068-0474 ? Cancer Care: Provides financial assistance, online support groups, medication/co-pay assistance.  1-800-813-HOPE 7340371887) ? [facility] Assists [place] cancer patients and their families through emotional , educational and financial support.  878-059-7628 ? [place] DSS Where to apply for food stamps, Medicaid and utility assistance. 309-076-6232 ? [employer]: Transportation to medical appointments. 234-432-2822 ? Social Security Administration: May apply for disability if have a Stage IV cancer. (806)556-0506 339-072-2997 ? [employer], Disability and Transit Services: Assists with nutrition, care and transit needs. [place] Support Programs: @10RELATIVEDAYS @ > Cancer Support Group  2nd Tuesday of the month 1pm-2pm, Journey Room  > Creative Journey  3rd Tuesday of the month 1130am-1pm, Journey Room  > Look Good Feel Better  1st Wednesday of the month 10am-12 noon, Journey Room (Call [facility] to register [phone])

## 2016-12-17 NOTE — Progress Notes (Unsigned)
Faxed referral/pt records to [facility].

## 2016-12-17 NOTE — Pre-Procedure Instructions (Signed)
Right chest wall wound

## 2016-12-18 LAB — FSH/LH
FSH: 5.6 m[IU]/mL
LH: 12.5 m[IU]/mL

## 2016-12-18 LAB — ESTRADIOL: Estradiol: 98.9 pg/mL

## 2016-12-19 NOTE — Progress Notes (Signed)
[facility] Clinical Social Work  Clinical Social Work was referred by NP for assessment of psychosocial needs due possible brief mention in Psychologist, sport and exercise note of possible restraining order against husband.  Clinical Social Worker met with patient at [facility] when here for follow up to offer support and assess for needs.  CSW had also talked with her over the phone about one month ago due to distress screen trigger. This was CSW first time meeting pt and she was with her husband. CSW introduced self, explained role of CSW/Pt and Family Support Team, support groups and other resources to assist. Pt and husband denied current concerns other than possible financial concerns related to medical bills. CSW provided education around these resources. Husband appeared supportive and remain quiet, calm and reserved through out meeting with CSW. CSW phoned pt on 12/19/16 to follow up on possible safety concern.   Pt reports her husband is in recovery for alcoholism and had been sober for many years. He appears to be stressed due to her breast cancer and decided to have a drink to cope. This led to aggressive behavior. She reports he has returned to [place] and counseling, just picked up his 30 day chip and is doing very well. She reports things are "much better" and denied any safety concerns. CSW strongly encouraged pt to utilize local resources as needed. She reports to feel well supported currently and agrees to seek assistance if the need arises. She appreciated call.    Clinical Social Work interventions:  Assess safety concerns Resource education and referral  Loren Racer, LCSW, OSW-C [place] Tuesdays   [phone]

## 2016-12-20 ENCOUNTER — Encounter (HOSPITAL_COMMUNITY): Payer: Self-pay

## 2016-12-20 ENCOUNTER — Encounter (HOSPITAL_COMMUNITY): Payer: Self-pay | Admitting: Adult Health

## 2016-12-20 NOTE — Progress Notes (Unsigned)
BCI results received revealing LOW risk of recurrence and LOW likelihood of benefit from extension of anti-estrogen therapy beyond 5 years.    -Therefore, she will require Tamoxifen/AI for total of 5 years only.    Mike Craze, NP [place] [phone]

## 2016-12-23 ENCOUNTER — Ambulatory Visit: Payer: Self-pay | Admitting: Plastic Surgery

## 2016-12-23 ENCOUNTER — Encounter (HOSPITAL_BASED_OUTPATIENT_CLINIC_OR_DEPARTMENT_OTHER): Payer: Self-pay | Admitting: *Deleted

## 2016-12-23 DIAGNOSIS — Z9013 Acquired absence of bilateral breasts and nipples: Secondary | ICD-10-CM

## 2016-12-23 DIAGNOSIS — M25461 Effusion, right knee: Secondary | ICD-10-CM | POA: Diagnosis not present

## 2016-12-23 DIAGNOSIS — I10 Essential (primary) hypertension: Secondary | ICD-10-CM | POA: Diagnosis not present

## 2016-12-23 DIAGNOSIS — F17200 Nicotine dependence, unspecified, uncomplicated: Secondary | ICD-10-CM | POA: Diagnosis not present

## 2016-12-23 DIAGNOSIS — Z79899 Other long term (current) drug therapy: Secondary | ICD-10-CM | POA: Diagnosis not present

## 2016-12-23 DIAGNOSIS — M1711 Unilateral primary osteoarthritis, right knee: Secondary | ICD-10-CM | POA: Diagnosis not present

## 2016-12-23 DIAGNOSIS — M25561 Pain in right knee: Secondary | ICD-10-CM | POA: Diagnosis not present

## 2016-12-23 NOTE — H&P (Signed)
Susan Benton is an 46 y.o. female.   Chief Complaint: acquired absence of breast HPI: The patient is a 46 y.o. yrs old wf here for evaluation and consultation for breast reconstruction.  She was diagnosed with RIGHT upper-outer quadrant invasive ductal carcinoma, HER2 negative. She underwent a bilateral mastectomy 10/23/16.  She returned to the OR for lymph node excision on 11/20/16 on the right side.  She had a 2 cm area of flap breakdown on the right upper breast flap.  She is currently smoking about a half pack per day.  She is 5 feet 9 inches tall, weight is 257 pounds. Her preop bra = 40 DD.  She is interested in reconstruction.  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  She would like to be around a D size cup.  Her skin is loss at this time around the breast flap area.     Past Medical History:  Diagnosis Date  . Arthritis   . Depression 2013  . Fibroids, submucosal 10/04/2016  . Hep C w/o coma, chronic ([place]) 06/27/2014  . Hx of right and left heart catheterization 2014   had cath in [place], no intervention  . Hypertension 1987  . Invasive ductal carcinoma of breast ([place]) 10/14/2016    Past Surgical History:  Procedure Laterality Date  . AXILLARY SENTINEL NODE BIOPSY Right 11/20/2016   Procedure: AXILLARY SENTINEL NODE BIOPSY;  Surgeon: Aviva Signs, MD;  Location: [facility];  Service: General;  Laterality: Right;  Sentinel Node Injection @ 10:30am  . CARDIAC CATHETERIZATION    . DILITATION & CURRETTAGE/HYSTROSCOPY WITH NOVASURE ABLATION N/A 10/23/2016   Procedure: DILATATION & CURETTAGE/HYSTEROSCOPY WITH NOVASURE ENDOMETRIAL ABLATION;  Surgeon: Florian Buff, MD;  Location: [facility];  Service: Gynecology;  Laterality: N/A;  . MASTECTOMY MODIFIED RADICAL Right 10/23/2016   Procedure: MASTECTOMY MODIFIED RADICAL;  Surgeon: Aviva Signs, MD;  Location: [facility];  Service: General;  Laterality: Right;  . SIMPLE MASTECTOMY WITH AXILLARY SENTINEL NODE BIOPSY  Left 10/23/2016   Procedure: SIMPLE MASTECTOMY;  Surgeon: Aviva Signs, MD;  Location: [facility];  Service: General;  Laterality: Left;  . TONSILECTOMY, ADENOIDECTOMY, BILATERAL MYRINGOTOMY AND TUBES Bilateral 1984  . TUBAL LIGATION  1998   2000    Family History  Problem Relation Age of Onset  . Hypertension Mother   . Cancer Mother   . Diabetes Father   . Hypertension Father   . Kidney disease Father   . Hyperlipidemia Father   . Asthma Father   . COPD Father   . Heart disease Father   . HIV/AIDS Brother   . Cancer Maternal Grandmother        breast  . Breast cancer Maternal Grandmother   . Hypertension Son    Social History:  reports that she has been smoking Cigarettes.  She has a 5.00 pack-year smoking history. She has never used smokeless tobacco. She reports that she does not drink alcohol or use drugs.  Allergies: No Known Allergies  No prescriptions prior to admission.    No results found for this or any previous visit (from the past 48 hour(s)). No results found.  Review of Systems  Constitutional: Negative.   HENT: Negative.   Eyes: Negative.   Respiratory: Negative.   Cardiovascular: Negative.   Gastrointestinal: Negative.   Genitourinary: Negative.   Musculoskeletal: Negative.   Skin: Negative.   Neurological: Negative.   Psychiatric/Behavioral: Negative.     Height '5\' 9"'$  (1.753  m), weight 116.1 kg (256 lb), last menstrual period 12/23/2016. Physical Exam  Constitutional: She is oriented to person, place, and time. She appears well-developed and well-nourished.  HENT:  Head: Normocephalic and atraumatic.  Eyes: Pupils are equal, round, and reactive to light. EOM are normal.  Cardiovascular: Normal rate.   Respiratory: Effort normal.  GI: Soft.  Neurological: She is alert and oriented to person, place, and time.  Skin: Skin is warm. No erythema.  Psychiatric: She has a normal mood and affect. Her behavior is normal. Judgment and thought content  normal.     Assessment/Plan Plan for bilateral breast reconstruction with expanders and FlexHD placement.  Wallace Going, DO 12/23/2016, 6:20 PM

## 2016-12-25 ENCOUNTER — Ambulatory Visit (HOSPITAL_BASED_OUTPATIENT_CLINIC_OR_DEPARTMENT_OTHER): Payer: BLUE CROSS/BLUE SHIELD | Admitting: Anesthesiology

## 2016-12-25 ENCOUNTER — Encounter (HOSPITAL_BASED_OUTPATIENT_CLINIC_OR_DEPARTMENT_OTHER): Payer: Self-pay | Admitting: *Deleted

## 2016-12-25 ENCOUNTER — Ambulatory Visit (HOSPITAL_BASED_OUTPATIENT_CLINIC_OR_DEPARTMENT_OTHER)
Admission: RE | Admit: 2016-12-25 | Discharge: 2016-12-26 | Disposition: A | Discharge status: Home / Self Care | Payer: BLUE CROSS/BLUE SHIELD | Source: Ambulatory Visit | Attending: Plastic Surgery | Admitting: Plastic Surgery

## 2016-12-25 ENCOUNTER — Encounter (HOSPITAL_BASED_OUTPATIENT_CLINIC_OR_DEPARTMENT_OTHER)
Admission: RE | Disposition: A | Discharge status: Home / Self Care | Payer: Self-pay | Source: Ambulatory Visit | Attending: Plastic Surgery

## 2016-12-25 DIAGNOSIS — Z825 Family history of asthma and other chronic lower respiratory diseases: Secondary | ICD-10-CM | POA: Insufficient documentation

## 2016-12-25 DIAGNOSIS — Z421 Encounter for breast reconstruction following mastectomy: Secondary | ICD-10-CM | POA: Diagnosis not present

## 2016-12-25 DIAGNOSIS — Z8489 Family history of other specified conditions: Secondary | ICD-10-CM | POA: Insufficient documentation

## 2016-12-25 DIAGNOSIS — Z841 Family history of disorders of kidney and ureter: Secondary | ICD-10-CM | POA: Insufficient documentation

## 2016-12-25 DIAGNOSIS — C50911 Malignant neoplasm of unspecified site of right female breast: Secondary | ICD-10-CM | POA: Diagnosis not present

## 2016-12-25 DIAGNOSIS — Z6837 Body mass index (BMI) 37.0-37.9, adult: Secondary | ICD-10-CM | POA: Diagnosis not present

## 2016-12-25 DIAGNOSIS — B182 Chronic viral hepatitis C: Secondary | ICD-10-CM | POA: Diagnosis not present

## 2016-12-25 DIAGNOSIS — Z9012 Acquired absence of left breast and nipple: Secondary | ICD-10-CM

## 2016-12-25 DIAGNOSIS — Z853 Personal history of malignant neoplasm of breast: Secondary | ICD-10-CM | POA: Insufficient documentation

## 2016-12-25 DIAGNOSIS — Z9013 Acquired absence of bilateral breasts and nipples: Secondary | ICD-10-CM

## 2016-12-25 DIAGNOSIS — I10 Essential (primary) hypertension: Secondary | ICD-10-CM | POA: Diagnosis not present

## 2016-12-25 DIAGNOSIS — F329 Major depressive disorder, single episode, unspecified: Secondary | ICD-10-CM | POA: Insufficient documentation

## 2016-12-25 DIAGNOSIS — M1990 Unspecified osteoarthritis, unspecified site: Secondary | ICD-10-CM | POA: Insufficient documentation

## 2016-12-25 DIAGNOSIS — Z803 Family history of malignant neoplasm of breast: Secondary | ICD-10-CM | POA: Diagnosis not present

## 2016-12-25 DIAGNOSIS — Z8249 Family history of ischemic heart disease and other diseases of the circulatory system: Secondary | ICD-10-CM | POA: Diagnosis not present

## 2016-12-25 DIAGNOSIS — F17210 Nicotine dependence, cigarettes, uncomplicated: Secondary | ICD-10-CM | POA: Diagnosis not present

## 2016-12-25 DIAGNOSIS — Z809 Family history of malignant neoplasm, unspecified: Secondary | ICD-10-CM | POA: Diagnosis not present

## 2016-12-25 DIAGNOSIS — Z833 Family history of diabetes mellitus: Secondary | ICD-10-CM | POA: Diagnosis not present

## 2016-12-25 HISTORY — PX: BREAST RECONSTRUCTION WITH PLACEMENT OF TISSUE EXPANDER AND FLEX HD (ACELLULAR HYDRATED DERMIS): SHX6295

## 2016-12-25 SURGERY — BREAST RECONSTRUCTION WITH PLACEMENT OF TISSUE EXPANDER AND FLEX HD (ACELLULAR HYDRATED DERMIS)
Anesthesia: General | Site: Breast | Laterality: Bilateral

## 2016-12-25 MED ORDER — HYDROMORPHONE HCL 1 MG/ML IJ SOLN
0.2500 mg | INTRAMUSCULAR | Status: DC | PRN
Start: 2016-12-25 — End: 2016-12-25
  Administered 2016-12-25 (×4): 0.5 mg via INTRAVENOUS

## 2016-12-25 MED ORDER — TRAMADOL HCL 50 MG PO TABS: 50.0000 mg | ORAL_TABLET | Freq: Four times a day (QID) | ORAL | Status: DC | PRN

## 2016-12-25 MED ORDER — SUGAMMADEX SODIUM 200 MG/2ML IV SOLN
INTRAVENOUS | Status: DC | PRN
  Administered 2016-12-25: 200 mg via INTRAVENOUS

## 2016-12-25 MED ORDER — FENTANYL CITRATE (PF) 100 MCG/2ML IJ SOLN
INTRAMUSCULAR | Status: AC
  Filled 2016-12-25: qty 2

## 2016-12-25 MED ORDER — HYDROMORPHONE HCL 1 MG/ML IJ SOLN
1.0000 mg | INTRAMUSCULAR | Status: DC | PRN
  Administered 2016-12-25: 1 mg via INTRAVENOUS
  Filled 2016-12-25: qty 1

## 2016-12-25 MED ORDER — ONDANSETRON 4 MG PO TBDP: 4.0000 mg | ORAL_TABLET | Freq: Four times a day (QID) | ORAL | Status: DC | PRN

## 2016-12-25 MED ORDER — LACTATED RINGERS IV SOLN
INTRAVENOUS | Status: DC
  Administered 2016-12-25 (×3): via INTRAVENOUS

## 2016-12-25 MED ORDER — OXYCODONE-ACETAMINOPHEN 5-325 MG PO TABS: 1.0000 | ORAL_TABLET | Freq: Four times a day (QID) | ORAL | 0 refills | Status: DC | PRN

## 2016-12-25 MED ORDER — HYDROMORPHONE HCL 1 MG/ML IJ SOLN
1.0000 mg | INTRAMUSCULAR | Status: DC | PRN
  Administered 2016-12-25 – 2016-12-26 (×3): 1 mg via INTRAVENOUS
  Filled 2016-12-25 (×3): qty 1

## 2016-12-25 MED ORDER — ZOLPIDEM TARTRATE 5 MG PO TABS: 5.0000 mg | ORAL_TABLET | Freq: Every evening | ORAL | Status: DC | PRN

## 2016-12-25 MED ORDER — DIAZEPAM 2 MG PO TABS: 2.0000 mg | ORAL_TABLET | Freq: Three times a day (TID) | ORAL | 0 refills | Status: DC | PRN

## 2016-12-25 MED ORDER — DIPHENHYDRAMINE HCL 12.5 MG/5ML PO ELIX
12.5000 mg | ORAL_SOLUTION | Freq: Four times a day (QID) | ORAL | Status: DC | PRN
Start: 2016-12-25 — End: 2016-12-26

## 2016-12-25 MED ORDER — ONDANSETRON HCL 4 MG/2ML IJ SOLN
INTRAMUSCULAR | Status: AC
  Filled 2016-12-25: qty 2

## 2016-12-25 MED ORDER — FENTANYL CITRATE (PF) 100 MCG/2ML IJ SOLN
50.0000 ug | INTRAMUSCULAR | Status: AC | PRN
  Administered 2016-12-25: 50 ug via INTRAVENOUS
  Administered 2016-12-25: 100 ug via INTRAVENOUS
  Administered 2016-12-25 (×3): 25 ug via INTRAVENOUS
  Administered 2016-12-25: 50 ug via INTRAVENOUS

## 2016-12-25 MED ORDER — DIPHENHYDRAMINE HCL 50 MG/ML IJ SOLN: 12.5000 mg | Freq: Four times a day (QID) | INTRAMUSCULAR | Status: DC | PRN

## 2016-12-25 MED ORDER — SUGAMMADEX SODIUM 200 MG/2ML IV SOLN
INTRAVENOUS | Status: AC
  Filled 2016-12-25: qty 2

## 2016-12-25 MED ORDER — SCOPOLAMINE 1 MG/3DAYS TD PT72: 1.0000 | MEDICATED_PATCH | Freq: Once | TRANSDERMAL | Status: DC | PRN

## 2016-12-25 MED ORDER — LIDOCAINE 2% (20 MG/ML) 5 ML SYRINGE
INTRAMUSCULAR | Status: AC
  Filled 2016-12-25: qty 5

## 2016-12-25 MED ORDER — HYDROMORPHONE HCL 1 MG/ML IJ SOLN
INTRAMUSCULAR | Status: AC
  Filled 2016-12-25: qty 0.5

## 2016-12-25 MED ORDER — PROPOFOL 10 MG/ML IV BOLUS
INTRAVENOUS | Status: AC
  Filled 2016-12-25: qty 20

## 2016-12-25 MED ORDER — CEFAZOLIN SODIUM-DEXTROSE 2-4 GM/100ML-% IV SOLN
2.0000 g | Freq: Three times a day (TID) | INTRAVENOUS | Status: DC
  Administered 2016-12-25: 2 g via INTRAVENOUS
  Filled 2016-12-25: qty 100

## 2016-12-25 MED ORDER — PROPOFOL 10 MG/ML IV BOLUS
INTRAVENOUS | Status: DC | PRN
  Administered 2016-12-25: 200 mg via INTRAVENOUS
  Administered 2016-12-25: 50 mg via INTRAVENOUS

## 2016-12-25 MED ORDER — OXYCODONE-ACETAMINOPHEN 5-325 MG PO TABS
1.0000 | ORAL_TABLET | ORAL | Status: DC | PRN
  Administered 2016-12-25 – 2016-12-26 (×2): 2 via ORAL
  Filled 2016-12-25: qty 2

## 2016-12-25 MED ORDER — SUCCINYLCHOLINE CHLORIDE 200 MG/10ML IV SOSY
PREFILLED_SYRINGE | INTRAVENOUS | Status: AC
  Filled 2016-12-25: qty 10

## 2016-12-25 MED ORDER — PROMETHAZINE HCL 25 MG/ML IJ SOLN
6.2500 mg | Freq: Once | INTRAMUSCULAR | Status: AC
  Administered 2016-12-25: 6.25 mg via INTRAVENOUS
  Filled 2016-12-25: qty 1

## 2016-12-25 MED ORDER — ONDANSETRON HCL 4 MG/2ML IJ SOLN
4.0000 mg | Freq: Four times a day (QID) | INTRAMUSCULAR | Status: DC | PRN
  Administered 2016-12-25: 4 mg via INTRAVENOUS

## 2016-12-25 MED ORDER — OXYCODONE-ACETAMINOPHEN 5-325 MG PO TABS
ORAL_TABLET | ORAL | Status: AC
  Filled 2016-12-25: qty 2

## 2016-12-25 MED ORDER — LISINOPRIL 10 MG PO TABS: 10.0000 mg | ORAL_TABLET | Freq: Every day | ORAL | Status: DC

## 2016-12-25 MED ORDER — VENLAFAXINE HCL 37.5 MG PO TABS: 37.5000 mg | ORAL_TABLET | Freq: Every day | ORAL | Status: DC

## 2016-12-25 MED ORDER — MIDAZOLAM HCL 2 MG/2ML IJ SOLN
INTRAMUSCULAR | Status: AC
  Filled 2016-12-25: qty 2

## 2016-12-25 MED ORDER — POLYETHYLENE GLYCOL 3350 17 G PO PACK: 17.0000 g | PACK | Freq: Every day | ORAL | Status: DC | PRN

## 2016-12-25 MED ORDER — BISACODYL 5 MG PO TBEC: 5.0000 mg | DELAYED_RELEASE_TABLET | Freq: Every day | ORAL | Status: DC | PRN

## 2016-12-25 MED ORDER — DIAZEPAM 2 MG PO TABS
2.0000 mg | ORAL_TABLET | Freq: Two times a day (BID) | ORAL | Status: DC | PRN
  Administered 2016-12-25: 2 mg via ORAL
  Filled 2016-12-25: qty 1

## 2016-12-25 MED ORDER — SODIUM CHLORIDE 0.9 % IV SOLN
INTRAVENOUS | Status: DC | PRN
  Administered 2016-12-25: 500 mL

## 2016-12-25 MED ORDER — DEXAMETHASONE SODIUM PHOSPHATE 10 MG/ML IJ SOLN
INTRAMUSCULAR | Status: AC
  Filled 2016-12-25: qty 1

## 2016-12-25 MED ORDER — ROCURONIUM BROMIDE 100 MG/10ML IV SOLN
INTRAVENOUS | Status: DC | PRN
  Administered 2016-12-25 (×2): 50 mg via INTRAVENOUS

## 2016-12-25 MED ORDER — DEXAMETHASONE SODIUM PHOSPHATE 4 MG/ML IJ SOLN
INTRAMUSCULAR | Status: DC | PRN
  Administered 2016-12-25: 10 mg via INTRAVENOUS

## 2016-12-25 MED ORDER — MIDAZOLAM HCL 2 MG/2ML IJ SOLN
1.0000 mg | INTRAMUSCULAR | Status: DC | PRN
  Administered 2016-12-25: 2 mg via INTRAVENOUS

## 2016-12-25 MED ORDER — GABAPENTIN 100 MG PO CAPS: 200.0000 mg | ORAL_CAPSULE | Freq: Every day | ORAL | Status: DC

## 2016-12-25 MED ORDER — KCL IN DEXTROSE-NACL 20-5-0.45 MEQ/L-%-% IV SOLN
INTRAVENOUS | Status: DC
  Administered 2016-12-25: 18:00:00 via INTRAVENOUS
  Filled 2016-12-25: qty 1000

## 2016-12-25 MED ORDER — CEFAZOLIN SODIUM-DEXTROSE 2-4 GM/100ML-% IV SOLN
INTRAVENOUS | Status: AC
  Filled 2016-12-25: qty 100

## 2016-12-25 MED ORDER — LIDOCAINE-EPINEPHRINE 1 %-1:100000 IJ SOLN
INTRAMUSCULAR | Status: DC | PRN
  Administered 2016-12-25: 10 mL

## 2016-12-25 MED ORDER — ACETAMINOPHEN 500 MG PO TABS: 1000.0000 mg | ORAL_TABLET | Freq: Four times a day (QID) | ORAL | Status: DC

## 2016-12-25 MED ORDER — CEFAZOLIN SODIUM-DEXTROSE 2-4 GM/100ML-% IV SOLN
2.0000 g | INTRAVENOUS | Status: AC
  Administered 2016-12-25 (×2): 2 g via INTRAVENOUS

## 2016-12-25 SURGICAL SUPPLY — 62 items
BAG DECANTER FOR FLEXI CONT (MISCELLANEOUS) ×2 IMPLANT
BINDER BREAST LRG (GAUZE/BANDAGES/DRESSINGS) IMPLANT
BINDER BREAST MEDIUM (GAUZE/BANDAGES/DRESSINGS) IMPLANT
BINDER BREAST XLRG (GAUZE/BANDAGES/DRESSINGS) IMPLANT
BINDER BREAST XXLRG (GAUZE/BANDAGES/DRESSINGS) IMPLANT
BIOPATCH RED 1 DISK 7.0 (GAUZE/BANDAGES/DRESSINGS) ×4 IMPLANT
BLADE HEX COATED 2.75 (ELECTRODE) ×2 IMPLANT
BLADE SURG 15 STRL LF DISP TIS (BLADE) ×1 IMPLANT
BLADE SURG 15 STRL SS (BLADE) ×1
BNDG GAUZE ELAST 4 BULKY (GAUZE/BANDAGES/DRESSINGS) IMPLANT
CANISTER SUCT 1200ML W/VALVE (MISCELLANEOUS) ×2 IMPLANT
CHLORAPREP W/TINT 26ML (MISCELLANEOUS) ×2 IMPLANT
COVER BACK TABLE 60X90IN (DRAPES) ×2 IMPLANT
COVER MAYO STAND STRL (DRAPES) ×2 IMPLANT
DECANTER SPIKE VIAL GLASS SM (MISCELLANEOUS) IMPLANT
DERMABOND ADVANCED (GAUZE/BANDAGES/DRESSINGS) ×2
DERMABOND ADVANCED .7 DNX12 (GAUZE/BANDAGES/DRESSINGS) ×2 IMPLANT
DRAIN CHANNEL 19F RND (DRAIN) ×4 IMPLANT
DRAPE LAPAROSCOPIC ABDOMINAL (DRAPES) ×2 IMPLANT
DRSG PAD ABDOMINAL 8X10 ST (GAUZE/BANDAGES/DRESSINGS) ×4 IMPLANT
ELECT BLADE 4.0 EZ CLEAN MEGAD (MISCELLANEOUS) ×2
ELECT REM PT RETURN 9FT ADLT (ELECTROSURGICAL) ×2
ELECTRODE BLDE 4.0 EZ CLN MEGD (MISCELLANEOUS) ×1 IMPLANT
ELECTRODE REM PT RTRN 9FT ADLT (ELECTROSURGICAL) ×1 IMPLANT
EVACUATOR SILICONE 100CC (DRAIN) ×4 IMPLANT
GAUZE SPONGE 4X4 12PLY STRL (GAUZE/BANDAGES/DRESSINGS) ×2 IMPLANT
GLOVE BIO SURGEON STRL SZ 6.5 (GLOVE) ×10 IMPLANT
GLOVE BIOGEL PI IND STRL 7.0 (GLOVE) ×2 IMPLANT
GLOVE BIOGEL PI INDICATOR 7.0 (GLOVE) ×2
GOWN STRL REUS W/ TWL LRG LVL3 (GOWN DISPOSABLE) ×4 IMPLANT
GOWN STRL REUS W/TWL LRG LVL3 (GOWN DISPOSABLE) ×4
GRAFT FLEX HD 4X16 THICK (Tissue Mesh) ×4 IMPLANT
IMPL EXPANDER BREAST 455CC (Breast) ×2 IMPLANT
IMPLANT BREAST 455CC (Breast) ×2 IMPLANT
IMPLANT EXPANDER BREAST 455CC (Breast) ×2 IMPLANT
IV NS 1000ML (IV SOLUTION) ×1
IV NS 1000ML BAXH (IV SOLUTION) ×1 IMPLANT
IV NS 500ML (IV SOLUTION)
IV NS 500ML BAXH (IV SOLUTION) IMPLANT
KIT FILL SYSTEM UNIVERSAL (SET/KITS/TRAYS/PACK) ×2 IMPLANT
NEEDLE HYPO 25X1 1.5 SAFETY (NEEDLE) ×2 IMPLANT
PACK BASIN DAY SURGERY FS (CUSTOM PROCEDURE TRAY) ×2 IMPLANT
PENCIL BUTTON HOLSTER BLD 10FT (ELECTRODE) ×2 IMPLANT
PIN SAFETY STERILE (MISCELLANEOUS) ×2 IMPLANT
SLEEVE SCD COMPRESS KNEE MED (MISCELLANEOUS) ×2 IMPLANT
SPONGE LAP 18X18 X RAY DECT (DISPOSABLE) ×4 IMPLANT
SUT MNCRL AB 4-0 PS2 18 (SUTURE) ×4 IMPLANT
SUT MON AB 3-0 SH 27 (SUTURE) ×2
SUT MON AB 3-0 SH27 (SUTURE) ×2 IMPLANT
SUT MON AB 5-0 PS2 18 (SUTURE) ×4 IMPLANT
SUT PDS 3-0 CT2 (SUTURE)
SUT PDS AB 2-0 CT2 27 (SUTURE) ×12 IMPLANT
SUT PDS II 3-0 CT2 27 ABS (SUTURE) IMPLANT
SUT SILK 3 0 PS 1 (SUTURE) ×4 IMPLANT
SUT VIC AB 3-0 SH 27 (SUTURE)
SUT VIC AB 3-0 SH 27X BRD (SUTURE) IMPLANT
SYR BULB IRRIGATION 50ML (SYRINGE) ×2 IMPLANT
SYR CONTROL 10ML LL (SYRINGE) ×2 IMPLANT
TOWEL OR 17X24 6PK STRL BLUE (TOWEL DISPOSABLE) ×4 IMPLANT
TUBE CONNECTING 20X1/4 (TUBING) ×2 IMPLANT
UNDERPAD 30X30 (UNDERPADS AND DIAPERS) ×4 IMPLANT
YANKAUER SUCT BULB TIP NO VENT (SUCTIONS) ×2 IMPLANT

## 2016-12-25 NOTE — Interval H&P Note (Signed)
History and Physical Interval Note:  12/25/2016 2:02 PM  Susan Benton  has presented today for surgery, with the diagnosis of ACQUIRED ABSCENCE OF BOTH BREASTS HX OF BREAST CANCER  The various methods of treatment have been discussed with the patient and family. After consideration of risks, benefits and other options for treatment, the patient has consented to  Procedure(s): BREAST RECONSTRUCTION WITH PLACEMENT OF TISSUE EXPANDER AND FLEX HD (ACELLULAR HYDRATED DERMIS) (Bilateral) as a surgical intervention .  The patient's history has been reviewed, patient examined, no change in status, stable for surgery.  I have reviewed the patient's chart and labs.  Questions were answered to the patient's satisfaction.     Wallace Going

## 2016-12-25 NOTE — Anesthesia Procedure Notes (Signed)
Procedure Name: Intubation Date/Time: 12/25/2016 2:24 PM Performed by: Lieutenant Diego Pre-anesthesia Checklist: Patient identified, Emergency Drugs available, Suction available and Patient being monitored Patient Re-evaluated:Patient Re-evaluated prior to induction Oxygen Delivery Method: Circle system utilized Preoxygenation: Pre-oxygenation with 100% oxygen Induction Type: IV induction Ventilation: Mask ventilation without difficulty Laryngoscope Size: Miller and 2 Grade View: Grade I Tube type: Oral Tube size: 7.0 mm Number of attempts: 1 Airway Equipment and Method: Stylet and Oral airway Placement Confirmation: ETT inserted through vocal cords under direct vision,  positive ETCO2 and breath sounds checked- equal and bilateral Secured at: 21 cm Tube secured with: Tape Dental Injury: Teeth and Oropharynx as per pre-operative assessment

## 2016-12-25 NOTE — Anesthesia Preprocedure Evaluation (Addendum)
Anesthesia Evaluation  Patient identified by MRN, date of birth, ID band Patient awake    Reviewed: Allergy & Precautions, H&P , NPO status , Patient's Chart, lab work & pertinent test results, reviewed documented beta blocker date and time   Airway Mallampati: III  TM Distance: >3 FB Neck ROM: Full    Dental no notable dental hx. (+) Teeth Intact, Dental Advisory Given   Pulmonary Current Smoker,    Pulmonary exam normal breath sounds clear to auscultation       Cardiovascular hypertension, Pt. on medications, On Home Beta Blockers and On Medications  Rhythm:Regular Rate:Normal     Neuro/Psych negative neurological ROS  negative psych ROS   GI/Hepatic negative GI ROS,   Endo/Other  Morbid obesity  Renal/GU negative Renal ROS  negative genitourinary   Musculoskeletal  (+) Arthritis , Osteoarthritis,    Abdominal   Peds  Hematology negative hematology ROS (+)   Anesthesia Other Findings   Reproductive/Obstetrics negative OB ROS                            Anesthesia Physical Anesthesia Plan  ASA: III  Anesthesia Plan: General   Post-op Pain Management:    Induction: Intravenous  PONV Risk Score and Plan: 3 and Ondansetron, Dexamethasone and Midazolam  Airway Management Planned: Oral ETT  Additional Equipment:   Intra-op Plan:   Post-operative Plan: Extubation in OR  Informed Consent: I have reviewed the patients History and Physical, chart, labs and discussed the procedure including the risks, benefits and alternatives for the proposed anesthesia with the patient or authorized representative who has indicated his/her understanding and acceptance.   Dental advisory given  Plan Discussed with: CRNA  Anesthesia Plan Comments:         Anesthesia Quick Evaluation

## 2016-12-25 NOTE — Transfer of Care (Signed)
Immediate Anesthesia Transfer of Care Note  Patient: Susan Benton  Procedure(s) Performed: Procedure(s): BREAST RECONSTRUCTION WITH PLACEMENT OF TISSUE EXPANDER AND FLEX HD (ACELLULAR HYDRATED DERMIS) (Bilateral)  Patient Location: PACU  Anesthesia Type:General  Level of Consciousness: awake and alert   Airway & Oxygen Therapy: Patient Spontanous Breathing and Patient connected to face mask oxygen  Post-op Assessment: Report given to RN and Post -op Vital signs reviewed and stable  Post vital signs: Reviewed and stable  Last Vitals:  Vitals:   12/25/16 1302 12/25/16 1644  BP: 134/79   Pulse: 65 80  Resp: 18   Temp: 36.8 C     Last Pain:  Vitals:   12/25/16 1302  TempSrc: Oral  PainSc: 4       Patients Stated Pain Goal: 3 (01/77/93 9030)  Complications: No apparent anesthesia complications

## 2016-12-25 NOTE — Op Note (Signed)
Op report    DATE OF OPERATION:  12/25/2016  LOCATION: [facility]  SURGICAL DIVISION: Plastic Surgery  PREOPERATIVE DIAGNOSES:  1. Acquired absence of bilateral breasts. 2. Right Breast cancer.    POSTOPERATIVE DIAGNOSES:  1. Acquired absence of bilateral breasts. 2. Right Breast cancer.    PROCEDURE:  1. Bilateral breast reconstruction with placement of Acellular Dermal Matrix and tissue expanders.  SURGEON: Claire Sanger Dillingham, DO  ASSISTANT: Shawn Rayburn, PA  ANESTHESIA:  General.   COMPLICATIONS: None.   IMPLANTS: Left - Mentor [id number] 455cc.  Serial Number [id number] Right - Mentor  [id number] 455cc.  Serial Number [id number] Acellular Dermal Matrix - FlexHD 4 x 16 cm two  INDICATIONS FOR PROCEDURE:  The patient, Susan Benton, is a 46 y.o. female born on 1970-11-14, is here for breast reconstruction with placement of bilateral tissue expander and Acellular dermal matrix. MRN: [id number]  CONSENT:  Informed consent was obtained directly from the patient. Risks, benefits and alternatives were fully discussed. Specific risks including but not limited to bleeding, infection, hematoma, seroma, scarring, pain, implant infection, implant extrusion, capsular contracture, asymmetry, wound healing problems, and need for further surgery were all discussed. The patient did have an ample opportunity to have her questions answered to her satisfaction.   DESCRIPTION OF PROCEDURE:  The patient was taken to the operating room. SCDs were placed and IV antibiotics were given. The patient's chest was prepped and draped in a sterile fashion. A time out was performed and the implants to be used were identified.  Local was injected at the previous incision site of the breasts.  Left:   The #10 blade was used to excise the previous incision.  The bovie was used to dissect to the pectoralis muscle.  The breast flap was lifted from the muscle. The pectoralis  major muscle was lifted from the chest wall with release of the lateral edge and lateral inframammary fold.  The pocket was irrigated with antibiotic solution and hemostasis was achieved with electrocautery.  The ADM was then prepared according to the manufacture guidelines and slits placed to help with postoperative fluid management.  The ADM was then sutured to the inferior and lateral edge of the inframammary fold with 2-0 PDS starting with an interrupted stitch and then a running stitch.  The lateral portion was sutured to with interrupted sutures after the expander was placed.  The expander was prepared according to the manufacture guidelines, the air evacuated and then it was placed under the ADM and pectoralis major muscle.  The inferior and lateral tabs were used to secure the expander to the chest wall with 2-0 PDS.  The drain was placed at the inframammary fold over the ADM and secured to the skin with 3-0 Silk.  The deep layers were closed with 3-0 Monocryl followed by 4-0 Monocryl.  The skin was closed with 5-0 Monocryl and then dermabond was applied. There was significant capsule formation and scaring that was excised.  Right: Left:   The #10 blade was used to excise the previous incision.  The bovie was used to dissect to the pectoralis muscle.  The breast flap was lifted from the muscle.The pectoralis major muscle was lifted from the chest wall with release of the lateral edge and lateral inframammary fold.  The pocket was irrigated with antibiotic solution and hemostasis was achieved with electrocautery.  The ADM was then prepared according to the manufacture guidelines and slits placed to help with postoperative fluid management.  The  ADM was then sutured to the inferior and lateral edge of the inframammary fold with 2-0 PDS starting with an interrupted stitch and then a running stitch.  The lateral portion was sutured to with interrupted sutures after the expander was placed.  The expander was  prepared according to the manufacture guidelines, the air evacuated and then it was placed under the ADM and pectoralis major muscle.  The inferior and lateral tabs were used to secure the expander to the chest wall with 2-0 PDS.  The drain was placed at the inframammary fold over the ADM and secured to the skin with 3-0 Silk.  The deep layers were closed with 3-0 Monocryl followed by 4-0 Monocryl.  The skin was closed with 5-0 Monocryl and then dermabond was applied. There was significant capsule formation and scaring that was excised.  The ABDs and breast binder were placed.  The patient tolerated the procedure well and there were no complications.  The patient was allowed to wake from anesthesia and taken to the recovery room in satisfactory condition.

## 2016-12-25 NOTE — Interval H&P Note (Signed)
History and Physical Interval Note:  12/25/2016 1:11 PM  Susan Benton  has presented today for surgery, with the diagnosis of ACQUIRED ABSCENCE OF BOTH BREASTS HX OF BREAST CANCER  The various methods of treatment have been discussed with the patient and family. After consideration of risks, benefits and other options for treatment, the patient has consented to  Procedure(s): BREAST RECONSTRUCTION WITH PLACEMENT OF TISSUE EXPANDER AND FLEX HD (ACELLULAR HYDRATED DERMIS) (Bilateral) as a surgical intervention .  The patient's history has been reviewed, patient examined, no change in status, stable for surgery.  I have reviewed the patient's chart and labs.  Questions were answered to the patient's satisfaction.     Wallace Going

## 2016-12-25 NOTE — Discharge Instructions (Signed)

## 2016-12-26 ENCOUNTER — Encounter (HOSPITAL_BASED_OUTPATIENT_CLINIC_OR_DEPARTMENT_OTHER): Payer: Self-pay | Admitting: Plastic Surgery

## 2016-12-26 DIAGNOSIS — Z8489 Family history of other specified conditions: Secondary | ICD-10-CM | POA: Diagnosis not present

## 2016-12-26 DIAGNOSIS — Z809 Family history of malignant neoplasm, unspecified: Secondary | ICD-10-CM | POA: Diagnosis not present

## 2016-12-26 DIAGNOSIS — Z421 Encounter for breast reconstruction following mastectomy: Secondary | ICD-10-CM | POA: Diagnosis not present

## 2016-12-26 DIAGNOSIS — Z825 Family history of asthma and other chronic lower respiratory diseases: Secondary | ICD-10-CM | POA: Diagnosis not present

## 2016-12-26 DIAGNOSIS — Z8249 Family history of ischemic heart disease and other diseases of the circulatory system: Secondary | ICD-10-CM | POA: Diagnosis not present

## 2016-12-26 DIAGNOSIS — Z803 Family history of malignant neoplasm of breast: Secondary | ICD-10-CM | POA: Diagnosis not present

## 2016-12-26 DIAGNOSIS — Z853 Personal history of malignant neoplasm of breast: Secondary | ICD-10-CM | POA: Diagnosis not present

## 2016-12-26 DIAGNOSIS — I10 Essential (primary) hypertension: Secondary | ICD-10-CM | POA: Diagnosis not present

## 2016-12-26 DIAGNOSIS — F329 Major depressive disorder, single episode, unspecified: Secondary | ICD-10-CM | POA: Diagnosis not present

## 2016-12-26 DIAGNOSIS — Z841 Family history of disorders of kidney and ureter: Secondary | ICD-10-CM | POA: Diagnosis not present

## 2016-12-26 DIAGNOSIS — F17210 Nicotine dependence, cigarettes, uncomplicated: Secondary | ICD-10-CM | POA: Diagnosis not present

## 2016-12-26 DIAGNOSIS — M1990 Unspecified osteoarthritis, unspecified site: Secondary | ICD-10-CM | POA: Diagnosis not present

## 2016-12-26 DIAGNOSIS — B182 Chronic viral hepatitis C: Secondary | ICD-10-CM | POA: Diagnosis not present

## 2016-12-26 DIAGNOSIS — Z6837 Body mass index (BMI) 37.0-37.9, adult: Secondary | ICD-10-CM | POA: Diagnosis not present

## 2016-12-26 DIAGNOSIS — Z833 Family history of diabetes mellitus: Secondary | ICD-10-CM | POA: Diagnosis not present

## 2016-12-26 NOTE — Anesthesia Postprocedure Evaluation (Signed)
Anesthesia Post Note  Patient: Madesyn Skellenger  Procedure(s) Performed: Procedure(s) (LRB): BREAST RECONSTRUCTION WITH PLACEMENT OF TISSUE EXPANDER AND FLEX HD (ACELLULAR HYDRATED DERMIS) (Bilateral)     Patient location during evaluation: PACU Anesthesia Type: General Level of consciousness: awake and alert Pain management: pain level controlled Vital Signs Assessment: post-procedure vital signs reviewed and stable Respiratory status: spontaneous breathing, nonlabored ventilation and respiratory function stable Cardiovascular status: blood pressure returned to baseline and stable Postop Assessment: no signs of nausea or vomiting Anesthetic complications: no    Last Vitals:  Vitals:   12/26/16 0015 12/26/16 0245  BP:  132/79  Pulse: 72 71  Resp:  18  Temp:  36.6 C    Last Pain:  Vitals:   12/26/16 0515  TempSrc:   PainSc: 5                  Mervyn Pflaum,W. EDMOND

## 2016-12-30 ENCOUNTER — Other Ambulatory Visit: Payer: Self-pay | Admitting: Family Medicine

## 2016-12-31 DIAGNOSIS — Z170 Estrogen receptor positive status [ER+]: Secondary | ICD-10-CM | POA: Diagnosis not present

## 2016-12-31 DIAGNOSIS — C50411 Malignant neoplasm of upper-outer quadrant of right female breast: Secondary | ICD-10-CM | POA: Diagnosis not present

## 2017-01-06 ENCOUNTER — Other Ambulatory Visit: Payer: Self-pay | Admitting: General Surgery

## 2017-01-06 ENCOUNTER — Other Ambulatory Visit: Payer: Self-pay | Admitting: Family Medicine

## 2017-01-06 DIAGNOSIS — I10 Essential (primary) hypertension: Secondary | ICD-10-CM

## 2017-01-09 ENCOUNTER — Other Ambulatory Visit (HOSPITAL_COMMUNITY): Payer: BLUE CROSS/BLUE SHIELD

## 2017-01-09 ENCOUNTER — Encounter (HOSPITAL_COMMUNITY): Payer: BLUE CROSS/BLUE SHIELD | Admitting: Genetic Counselor

## 2017-01-27 NOTE — Progress Notes (Signed)
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  ER+ (100%), PR+ (100%), Ki67 10%, HER2 neg (ratio 1.03).       10/23/2016 Surgery    Bilateral mastectomies Arnoldo Morale)      10/23/2016 Pathology Results    RIGHT mastectomy: Multifocal IDC, grade 1, spanning 4.6 cm & 0.6 cm. Low grade DCIS. Negative margins. HER2 repeated and neg (ratio 1.36).  LEFT mastectomy: Fibroadenoma, no malignancy.       11/20/2016 Surgery    (R) axillary sentinel lymph node biopsy Arnoldo Morale)      11/20/2016 Pathology Results    0/1 axillary sentinel LN biopsy for carcinoma.       11/28/2016 Oncotype testing    Recurrence score: 9 (low risk); associated with 6% risk of distant recurrence.       12/03/2016 Imaging    CT chest: IMPRESSION: 1. 7.7 cm lobulated fluid attenuation collection in the right axilla, could represent a postoperative fluid collection, with or without presence of infection. Further management will need to be based on clinical grounds. Could correlate with targeted sonography and aspiration as indicated. 2. Clear lung fields 3. Hepatic steatosis 4. Aortic Atherosclerosis      12/13/2016 Miscellaneous    BCI genomic testing revealed LOW risk of recurrence with 4.3% risk and LOW likelihood of benefit of extension of therapy beyond 5 years.          INTERVAL HISTORY:  Susan Benton 46 y.o. female returns  for follow-up for right breast cancer.   She tells me "these past few weeks have been rough."  She had breast reconstruction surgery on 12/25/16 with Dr. Marla Roe; she has been struggling with (R) chest wall redness/infection, being managed by Dr. Marla Roe; currently on Bactrim. She has required several different occasions of having fluid removed from her expander on the (R) d/t edema and associated infection.  Denies any fever/chills.  Continues to have intermittent hot flashes; currently on Effexor XR 75 mg daily.  Due to all of this, she states that her energy levels are very low.  Appetite 75%. She just feels very weak and tired.  Otherwise, she is largely without any other physical complaints.   Scheduled to see our genetics counselor on 02/06/17.   Shared with me that she will lose her insurance at the end of this month. She is concerned about the cost of her anti-estrogen medication and other meds.  She has submitted application for "[employer]" financial assistance; she also plans to go to DSS to apply for food stamps and Medicaid.    States that her menstrual periods have returned; "I thought I wouldn't bleed anymore after the ablation, but I bleed for ~10 days now."  She sees OB-GYN at [facility] in [place].       REVIEW OF SYSTEMS:  Review of Systems  Constitutional: Positive for fatigue. Negative for chills and fever.  HENT:  Negative.   Eyes: Negative.   Respiratory: Negative.  Negative for cough and shortness of breath.   Cardiovascular: Negative.   Gastrointestinal: Negative.   Endocrine: Positive for hot flashes.  Genitourinary: Positive for vaginal bleeding. Negative for dysuria and hematuria.   Neurological: Positive for extremity weakness.  Hematological: Negative.   Psychiatric/Behavioral: Positive for depression. The patient is nervous/anxious.      PAST MEDICAL/SURGICAL HISTORY:  Past Medical History:  Diagnosis Date  . Arthritis   . Depression 2013  .  Fibroids, submucosal 10/04/2016  . Hep C w/o coma, chronic (HCC) 06/27/2014  . Hx of right and left heart catheterization 2014   had cath in [place], no intervention  . Hypertension 1987  . Invasive ductal carcinoma of breast (HCC) 10/14/2016   Past Surgical History:  Procedure Laterality Date  . AXILLARY SENTINEL NODE BIOPSY Right 11/20/2016   Procedure: AXILLARY SENTINEL NODE BIOPSY;  Surgeon: Franky Macho, MD;  Location: [facility];  Service: General;  Laterality: Right;  Sentinel Node Injection @ 10:30am  . BREAST RECONSTRUCTION WITH PLACEMENT OF TISSUE EXPANDER AND FLEX HD (ACELLULAR HYDRATED DERMIS) Bilateral 12/25/2016   Procedure: BREAST RECONSTRUCTION WITH PLACEMENT OF TISSUE EXPANDER AND FLEX HD (ACELLULAR HYDRATED DERMIS);  Surgeon: Peggye Form, DO;  Location: [facility];  Service: Plastics;  Laterality: Bilateral;  . CARDIAC CATHETERIZATION    . DILITATION & CURRETTAGE/HYSTROSCOPY WITH NOVASURE ABLATION N/A 10/23/2016   Procedure: DILATATION & CURETTAGE/HYSTEROSCOPY WITH NOVASURE ENDOMETRIAL ABLATION;  Surgeon: Lazaro Arms, MD;  Location: [facility];  Service: Gynecology;  Laterality: N/A;  . MASTECTOMY MODIFIED RADICAL Right 10/23/2016   Procedure: MASTECTOMY MODIFIED RADICAL;  Surgeon: Franky Macho, MD;  Location: [facility];  Service: General;  Laterality: Right;  . SIMPLE MASTECTOMY WITH AXILLARY SENTINEL NODE BIOPSY Left 10/23/2016   Procedure: SIMPLE MASTECTOMY;  Surgeon: Franky Macho, MD;  Location: [facility];  Service: General;  Laterality: Left;  . TONSILECTOMY, ADENOIDECTOMY, BILATERAL MYRINGOTOMY AND TUBES Bilateral 1984  . TUBAL LIGATION  1998   2000     SOCIAL HISTORY:  Social History   Social History  . Marital status: Married    Spouse name: N/A  . Number of children: N/A  . Years of education: N/A   Occupational History  . Not on file.   Social History Main Topics  . Smoking status: Current Every Day Smoker    Packs/day: 0.25    Years: 20.00     Types: Cigarettes    Last attempt to quit: 07/20/2016  . Smokeless tobacco: Never Used     Comment: 2 per day  . Alcohol use No  . Drug use: No  . Sexual activity: Yes    Birth control/ protection: Surgical     Comment: tubal   Other Topics Concern  . Not on file   Social History Narrative  . No narrative on file    FAMILY HISTORY:  Family History  Problem Relation Age of Onset  . Hypertension Mother   . Cancer Mother   . Diabetes Father   . Hypertension Father   . Kidney disease Father   . Hyperlipidemia Father   . Asthma Father   . COPD Father   . Heart disease Father   . HIV/AIDS Brother   . Cancer Maternal Grandmother        breast  . Breast cancer Maternal Grandmother   . Hypertension Son     CURRENT MEDICATIONS:  Outpatient Encounter Prescriptions as of 01/28/2017  Medication Sig  Note  . ALPRAZolam (XANAX) 0.5 MG tablet Take 2 tablets (1 mg total) by mouth 3 (three) times daily as needed for anxiety. (Patient taking differently: Take 0.5 mg by mouth 2 (two) times daily as needed for anxiety. ) 12/03/2016: Patient took 0.'25mg'$  today at 2:00pm  . atenolol (TENORMIN) 100 MG tablet Take 1 tablet (100 mg total) by mouth daily.   . [EXPIRED] cyclobenzaprine (FLEXERIL) 10 MG tablet Take 10 mg by mouth.   . diazepam (VALIUM) 2 MG tablet Take 1 tablet (2 mg total) by mouth every 8 (eight) hours as needed for muscle spasms.   Marland Kitchen gabapentin (NEURONTIN) 100 MG capsule Take 200 mg by mouth at bedtime.   . gabapentin (NEURONTIN) 300 MG capsule TAKE ONE CAPSULE BY MOUTH 3 TIMES A DAY   . lisinopril (PRINIVIL,ZESTRIL) 10 MG tablet TAKE 1 TABLET(10 MG) BY MOUTH DAILY   . meloxicam (MOBIC) 15 MG tablet Take 15 mg by mouth daily as needed.   Marland Kitchen oxyCODONE-acetaminophen (PERCOCET/ROXICET) 5-325 MG tablet Take 1 tablet by mouth every 6 (six) hours as needed for severe pain.   Marland Kitchen oxyCODONE-acetaminophen (ROXICET) 5-325 MG tablet Take 1 tablet by mouth every 6 (six) hours as needed for  severe pain.   Marland Kitchen rOPINIRole (REQUIP) 2 MG tablet 1-2 tabs po qhs (Patient taking differently: Take 2 mg by mouth at bedtime. )   . [EXPIRED] sulfamethoxazole-trimethoprim (BACTRIM DS,SEPTRA DS) 800-160 MG tablet Take by mouth.   . [EXPIRED] traMADol (ULTRAM) 50 MG tablet Take 50 mg by mouth.   . [DISCONTINUED] venlafaxine (EFFEXOR) 37.5 MG tablet Take 1 tablet (37.5 mg total) by mouth daily. Take 1 tab daily x 2 weeks. Then take 2 tabs (75 mg) once daily thereafter.   . tamoxifen (NOLVADEX) 20 MG tablet Take 1 tablet (20 mg total) by mouth daily.    No facility-administered encounter medications on file as of 01/28/2017.     ALLERGIES:  No Known Allergies   PHYSICAL EXAM:  ECOG Performance status: 1 - Symptomatic; remains independent.   Vitals:   01/28/17 1138  BP: 123/82  Pulse: 91  Resp: 16  Temp: 98 F (36.7 C)  SpO2: 100%   Filed Weights   01/28/17 1138  Weight: 255 lb (115.7 kg)    Physical Exam  Constitutional: She is oriented to person, place, and time and well-developed, well-nourished, and in no distress.  HENT:  Head: Normocephalic.  Mouth/Throat: Oropharynx is clear and moist. No oropharyngeal exudate.  Eyes: Pupils are equal, round, and reactive to light. Conjunctivae are normal. No scleral icterus.  Neck: Normal range of motion. Neck supple.  Cardiovascular: Regular rhythm and normal heart sounds.   Mild tachycardia  Pulmonary/Chest: Effort normal and breath sounds normal. No respiratory distress.  Abdominal: Soft. Bowel sounds are normal. There is no tenderness.  Musculoskeletal: Normal range of motion. She exhibits no edema.  Lymphadenopathy:    She has no cervical adenopathy.       Right: No supraclavicular adenopathy present.       Left: No supraclavicular adenopathy present.  Neurological: She is alert and oriented to person, place, and time. No cranial nerve deficit. Gait normal.  Skin: Skin is warm. No rash noted.  (R) chest wall erythema    Psychiatric: Mood, memory, affect and judgment normal.  Nursing note and vitals reviewed.      LABORATORY DATA:  I have reviewed the labs as listed.  CBC    Component Value Date/Time   WBC 10.2 12/17/2016 1045  RBC 5.10 12/17/2016 1045   HGB 13.6 12/17/2016 1045   HCT 41.0 12/17/2016 1045   PLT 326 12/17/2016 1045   MCV 80.4 12/17/2016 1045   MCH 26.7 12/17/2016 1045   MCHC 33.2 12/17/2016 1045   RDW 13.9 12/17/2016 1045   LYMPHSABS 2.0 12/17/2016 1045   MONOABS 0.6 12/17/2016 1045   EOSABS 0.3 12/17/2016 1045   BASOSABS 0.0 12/17/2016 1045   CMP Latest Ref Rng & Units 12/17/2016 12/03/2016 11/08/2016  Glucose 65 - 99 mg/dL 93 101(H) 107(H)  BUN 6 - 20 mg/dL '12 13 12  '$ Creatinine 0.44 - 1.00 mg/dL 0.55 0.54 0.63  Sodium 135 - 145 mmol/L 136 138 136  Potassium 3.5 - 5.1 mmol/L 4.3 4.2 4.2  Chloride 101 - 111 mmol/L 103 106 102  CO2 22 - 32 mmol/L '23 22 24  '$ Calcium 8.9 - 10.3 mg/dL 9.3 9.3 9.1  Total Protein 6.5 - 8.1 g/dL 7.7 8.3(H) 7.3  Total Bilirubin 0.3 - 1.2 mg/dL 0.6 0.2(L) 0.3  Alkaline Phos 38 - 126 U/L 65 63 56  AST 15 - 41 U/L '25 29 30  '$ ALT 14 - 54 U/L 28 33 30    PENDING LABS:    DIAGNOSTIC IMAGING:  *The following radiologic images and reports have been reviewed independently and agree with below findings.  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 Patient elected to have bilateral mastectomies with Dr. Arnoldo Morale on 10/23/16, along with D&C/hysteroscopy with ablation (endometrial path benign); she later had right axillary sentinel lymph node biopsy on 11/20/16 and 0/1 LNs.   Oncotype DX score 9 (low risk), indicating no benefit from chemotherapy.  Referred to Dr. Quitman Livings in [place] at  [facility] for radiation consideration and he felt radiation was not warranted.  -Underwent bilateral breast reconstruction with Dr. Marla Roe on 12/25/16.  -She is ready to start anti-estrogen therapy.  FSH/LH/Estradiol levels indicate she is pre-menopausal, so Tamoxifen will be best choice for her. She has undergone endometrial ablation in the past, so we will have to continue to periodically check hormone levels to determine her menopausal status.  -Breast Cancer Index (BCI) testing results reviewed, which indicate LOW risk of recurrence and LOW likelihood of benefit from extension of Tamoxifen/AI beyond 5 years.  -Purpose, side effects, and duration of Tamoxifen therapy reviewed with patient today; Rx e-scribed to [place], as they often are able to help patients with no insurance.  Encouraged her to start on 02/06/17, which will be 4-5 days after she completes current antibiotic regimen. Planned duration of treatment 5 years (01/2022).  -No role for mammography given bilateral mastectomies. Will defer clinical breast exam today given recent surgery/chest wall infection and will perform at next visit.  -Return to cancer center in early 03/2017 to assess tolerance to Tamoxifen.    (R) breast fluid collection/wound:  -Maintain follow-up with her surgery team as directed.  -Clinical breast exam deferred today given current antibiotics and infection.         Dispo:  -Start Tamoxifen on 02/06/17.  -Return to cancer center in early 03/2017 for follow-up to assess tolerance to Tamoxifen   All questions were answered to patient's stated satisfaction.  Encouraged patient to call with any new concerns or questions before her next visit to the cancer center and we can certain see her sooner, if needed.    Plan of care discussed with Dr. Talbert Cage, who agrees with the above aforementioned.      Orders placed this encounter:  No orders of the defined types were placed in this  encounter.     Mike Craze, NP [place] [phone]

## 2017-01-28 ENCOUNTER — Encounter (HOSPITAL_COMMUNITY): Payer: BLUE CROSS/BLUE SHIELD | Attending: Oncology | Admitting: Adult Health

## 2017-01-28 ENCOUNTER — Encounter (HOSPITAL_COMMUNITY): Payer: Self-pay | Admitting: Adult Health

## 2017-01-28 VITALS — BP 123/82 | HR 91 | Temp 98.0°F | Resp 16 | Ht 69.0 in | Wt 255.0 lb

## 2017-01-28 DIAGNOSIS — C50411 Malignant neoplasm of upper-outer quadrant of right female breast: Secondary | ICD-10-CM | POA: Diagnosis not present

## 2017-01-28 DIAGNOSIS — Z170 Estrogen receptor positive status [ER+]: Secondary | ICD-10-CM

## 2017-01-28 DIAGNOSIS — Z79810 Long term (current) use of selective estrogen receptor modulators (SERMs): Secondary | ICD-10-CM

## 2017-01-28 MED ORDER — TAMOXIFEN CITRATE 20 MG PO TABS: 20.0000 mg | ORAL_TABLET | Freq: Every day | ORAL | 0 refills | Status: DC

## 2017-01-31 ENCOUNTER — Other Ambulatory Visit (HOSPITAL_COMMUNITY): Payer: Self-pay | Admitting: Adult Health

## 2017-01-31 DIAGNOSIS — C50411 Malignant neoplasm of upper-outer quadrant of right female breast: Secondary | ICD-10-CM

## 2017-01-31 DIAGNOSIS — F419 Anxiety disorder, unspecified: Secondary | ICD-10-CM

## 2017-01-31 DIAGNOSIS — R232 Flushing: Secondary | ICD-10-CM

## 2017-02-01 ENCOUNTER — Other Ambulatory Visit (HOSPITAL_COMMUNITY): Payer: Self-pay | Admitting: Adult Health

## 2017-02-01 DIAGNOSIS — Z170 Estrogen receptor positive status [ER+]: Secondary | ICD-10-CM

## 2017-02-01 DIAGNOSIS — R232 Flushing: Secondary | ICD-10-CM

## 2017-02-01 DIAGNOSIS — C50411 Malignant neoplasm of upper-outer quadrant of right female breast: Secondary | ICD-10-CM

## 2017-02-01 MED ORDER — VENLAFAXINE HCL ER 75 MG PO CP24: 75.0000 mg | ORAL_CAPSULE | Freq: Every day | ORAL | 2 refills | Status: DC

## 2017-02-05 DIAGNOSIS — Z6837 Body mass index (BMI) 37.0-37.9, adult: Secondary | ICD-10-CM | POA: Diagnosis not present

## 2017-02-05 DIAGNOSIS — F418 Other specified anxiety disorders: Secondary | ICD-10-CM | POA: Diagnosis not present

## 2017-02-05 DIAGNOSIS — C50919 Malignant neoplasm of unspecified site of unspecified female breast: Secondary | ICD-10-CM | POA: Diagnosis not present

## 2017-02-05 DIAGNOSIS — I10 Essential (primary) hypertension: Secondary | ICD-10-CM | POA: Diagnosis not present

## 2017-02-06 ENCOUNTER — Encounter (HOSPITAL_COMMUNITY): Payer: BLUE CROSS/BLUE SHIELD | Admitting: Genetic Counselor

## 2017-02-06 ENCOUNTER — Other Ambulatory Visit (HOSPITAL_COMMUNITY): Payer: BLUE CROSS/BLUE SHIELD

## 2017-02-25 ENCOUNTER — Ambulatory Visit: Payer: Self-pay | Admitting: Plastic Surgery

## 2017-02-25 DIAGNOSIS — N6489 Other specified disorders of breast: Secondary | ICD-10-CM

## 2017-02-26 ENCOUNTER — Encounter (HOSPITAL_COMMUNITY): Payer: Self-pay | Admitting: *Deleted

## 2017-02-26 MED ORDER — DEXTROSE 5 % IV SOLN
3.0000 g | INTRAVENOUS | Status: AC
  Administered 2017-02-27: 3 g via INTRAVENOUS
  Filled 2017-02-26: qty 3000

## 2017-02-26 MED ORDER — DEXTROSE 5 % IV SOLN: 3.0000 g | INTRAVENOUS | Status: DC

## 2017-02-26 NOTE — Progress Notes (Signed)
Spoke with pt for pre-op call. Pt denies cardiac history, chest pain or sob. Pt states she is not diabetic.  

## 2017-02-27 ENCOUNTER — Ambulatory Visit (HOSPITAL_COMMUNITY): Payer: BLUE CROSS/BLUE SHIELD | Admitting: Anesthesiology

## 2017-02-27 ENCOUNTER — Encounter (HOSPITAL_COMMUNITY): Payer: Self-pay

## 2017-02-27 ENCOUNTER — Ambulatory Visit (HOSPITAL_COMMUNITY)
Admission: RE | Admit: 2017-02-27 | Discharge: 2017-02-27 | Disposition: A | Discharge status: Home / Self Care | Payer: BLUE CROSS/BLUE SHIELD | Source: Ambulatory Visit | Attending: Plastic Surgery | Admitting: Plastic Surgery

## 2017-02-27 ENCOUNTER — Encounter (HOSPITAL_COMMUNITY)
Admission: RE | Disposition: A | Discharge status: Home / Self Care | Payer: Self-pay | Source: Ambulatory Visit | Attending: Plastic Surgery

## 2017-02-27 DIAGNOSIS — Z419 Encounter for procedure for purposes other than remedying health state, unspecified: Secondary | ICD-10-CM

## 2017-02-27 DIAGNOSIS — Z79899 Other long term (current) drug therapy: Secondary | ICD-10-CM | POA: Insufficient documentation

## 2017-02-27 DIAGNOSIS — F329 Major depressive disorder, single episode, unspecified: Secondary | ICD-10-CM | POA: Insufficient documentation

## 2017-02-27 DIAGNOSIS — Z421 Encounter for breast reconstruction following mastectomy: Secondary | ICD-10-CM | POA: Insufficient documentation

## 2017-02-27 DIAGNOSIS — M1990 Unspecified osteoarthritis, unspecified site: Secondary | ICD-10-CM | POA: Diagnosis not present

## 2017-02-27 DIAGNOSIS — F419 Anxiety disorder, unspecified: Secondary | ICD-10-CM | POA: Insufficient documentation

## 2017-02-27 DIAGNOSIS — I10 Essential (primary) hypertension: Secondary | ICD-10-CM | POA: Diagnosis not present

## 2017-02-27 DIAGNOSIS — G2581 Restless legs syndrome: Secondary | ICD-10-CM | POA: Insufficient documentation

## 2017-02-27 DIAGNOSIS — F17210 Nicotine dependence, cigarettes, uncomplicated: Secondary | ICD-10-CM | POA: Insufficient documentation

## 2017-02-27 DIAGNOSIS — Z6837 Body mass index (BMI) 37.0-37.9, adult: Secondary | ICD-10-CM | POA: Insufficient documentation

## 2017-02-27 DIAGNOSIS — B182 Chronic viral hepatitis C: Secondary | ICD-10-CM | POA: Diagnosis not present

## 2017-02-27 DIAGNOSIS — N6489 Other specified disorders of breast: Secondary | ICD-10-CM

## 2017-02-27 DIAGNOSIS — Z853 Personal history of malignant neoplasm of breast: Secondary | ICD-10-CM | POA: Diagnosis not present

## 2017-02-27 DIAGNOSIS — E669 Obesity, unspecified: Secondary | ICD-10-CM | POA: Insufficient documentation

## 2017-02-27 DIAGNOSIS — Z9013 Acquired absence of bilateral breasts and nipples: Secondary | ICD-10-CM | POA: Insufficient documentation

## 2017-02-27 HISTORY — PX: TISSUE EXPANDER PLACEMENT: SHX2530

## 2017-02-27 HISTORY — DX: Restless legs syndrome: G25.81

## 2017-02-27 HISTORY — DX: Iron deficiency: E61.1

## 2017-02-27 HISTORY — DX: Headache: R51

## 2017-02-27 HISTORY — DX: Headache, unspecified: R51.9

## 2017-02-27 HISTORY — DX: Anxiety disorder, unspecified: F41.9

## 2017-02-27 LAB — BASIC METABOLIC PANEL
Anion gap: 11 (ref 5–15)
BUN: 15 mg/dL (ref 6–20)
CALCIUM: 8.6 mg/dL — AB (ref 8.9–10.3)
CO2: 20 mmol/L — AB (ref 22–32)
Chloride: 103 mmol/L (ref 101–111)
Creatinine, Ser: 0.58 mg/dL (ref 0.44–1.00)
GFR calc Af Amer: >60 mL/min (ref >60)
GLUCOSE: 91 mg/dL (ref 65–99)
Potassium: 4.3 mmol/L (ref 3.5–5.1)
Sodium: 134 mmol/L — ABNORMAL LOW (ref 135–145)

## 2017-02-27 LAB — CBC
HCT: 37.9 % (ref 36.0–46.0)
HEMOGLOBIN: 12.2 g/dL (ref 12.0–15.0)
MCH: 25.4 pg — AB (ref 26.0–34.0)
MCHC: 32.2 g/dL (ref 30.0–36.0)
MCV: 78.8 fL (ref 78.0–100.0)
PLATELETS: 326 10*3/uL (ref 150–400)
RBC: 4.81 MIL/uL (ref 3.87–5.11)
RDW: 15.9 % — AB (ref 11.5–15.5)
WBC: 10.2 10*3/uL (ref 4.0–10.5)

## 2017-02-27 LAB — HCG, SERUM, QUALITATIVE: PREG SERUM: NEGATIVE

## 2017-02-27 SURGERY — INSERTION, TISSUE EXPANDER
Anesthesia: General | Laterality: Bilateral

## 2017-02-27 MED ORDER — LACTATED RINGERS IV SOLN
INTRAVENOUS | Status: DC | PRN
  Administered 2017-02-27: 12:00:00 via INTRAVENOUS

## 2017-02-27 MED ORDER — MIDAZOLAM HCL 2 MG/2ML IJ SOLN
INTRAMUSCULAR | Status: DC | PRN
  Administered 2017-02-27: 2 mg via INTRAVENOUS

## 2017-02-27 MED ORDER — OXYCODONE HCL 5 MG PO TABS
5.0000 mg | ORAL_TABLET | ORAL | Status: DC | PRN
  Administered 2017-02-27: 10 mg via ORAL

## 2017-02-27 MED ORDER — FENTANYL CITRATE (PF) 100 MCG/2ML IJ SOLN
INTRAMUSCULAR | Status: AC
  Filled 2017-02-27: qty 2

## 2017-02-27 MED ORDER — PROPOFOL 10 MG/ML IV BOLUS
INTRAVENOUS | Status: DC | PRN
  Administered 2017-02-27: 50 mg via INTRAVENOUS
  Administered 2017-02-27: 200 mg via INTRAVENOUS

## 2017-02-27 MED ORDER — BUPIVACAINE-EPINEPHRINE (PF) 0.25% -1:200000 IJ SOLN
INTRAMUSCULAR | Status: AC
  Filled 2017-02-27: qty 30

## 2017-02-27 MED ORDER — LACTATED RINGERS IV SOLN: INTRAVENOUS | Status: DC

## 2017-02-27 MED ORDER — PHENYLEPHRINE HCL 10 MG/ML IJ SOLN
INTRAVENOUS | Status: DC | PRN
  Administered 2017-02-27: 20 ug/min via INTRAVENOUS

## 2017-02-27 MED ORDER — LIDOCAINE 2% (20 MG/ML) 5 ML SYRINGE
INTRAMUSCULAR | Status: DC | PRN
  Administered 2017-02-27: 100 mg via INTRAVENOUS

## 2017-02-27 MED ORDER — ONDANSETRON HCL 4 MG/2ML IJ SOLN
INTRAMUSCULAR | Status: DC | PRN
  Administered 2017-02-27: 4 mg via INTRAVENOUS

## 2017-02-27 MED ORDER — ACETAMINOPHEN 650 MG RE SUPP: 650.0000 mg | RECTAL | Status: DC | PRN

## 2017-02-27 MED ORDER — OXYCODONE HCL 5 MG PO TABS
ORAL_TABLET | ORAL | Status: AC
  Administered 2017-02-27: 10 mg via ORAL
  Filled 2017-02-27: qty 2

## 2017-02-27 MED ORDER — ACETAMINOPHEN 325 MG PO TABS: 650.0000 mg | ORAL_TABLET | ORAL | Status: DC | PRN

## 2017-02-27 MED ORDER — LACTATED RINGERS IV SOLN
INTRAVENOUS | Status: DC
  Administered 2017-02-27: 10:00:00 via INTRAVENOUS

## 2017-02-27 MED ORDER — 0.9 % SODIUM CHLORIDE (POUR BTL) OPTIME
TOPICAL | Status: DC | PRN
  Administered 2017-02-27: 1000 mL

## 2017-02-27 MED ORDER — PROMETHAZINE HCL 25 MG/ML IJ SOLN: 6.2500 mg | INTRAMUSCULAR | Status: DC | PRN

## 2017-02-27 MED ORDER — FENTANYL CITRATE (PF) 250 MCG/5ML IJ SOLN
INTRAMUSCULAR | Status: AC
  Filled 2017-02-27: qty 5

## 2017-02-27 MED ORDER — PROPOFOL 10 MG/ML IV BOLUS
INTRAVENOUS | Status: AC
  Filled 2017-02-27: qty 20

## 2017-02-27 MED ORDER — SCOPOLAMINE 1 MG/3DAYS TD PT72
MEDICATED_PATCH | TRANSDERMAL | Status: AC
  Administered 2017-02-27: 1.5 mg
  Filled 2017-02-27: qty 1

## 2017-02-27 MED ORDER — SODIUM CHLORIDE 0.9 % IV SOLN
INTRAVENOUS | Status: DC | PRN
  Administered 2017-02-27: 500 mL

## 2017-02-27 MED ORDER — DEXAMETHASONE SODIUM PHOSPHATE 10 MG/ML IJ SOLN
INTRAMUSCULAR | Status: DC | PRN
  Administered 2017-02-27: 10 mg via INTRAVENOUS

## 2017-02-27 MED ORDER — BUPIVACAINE-EPINEPHRINE (PF) 0.25% -1:200000 IJ SOLN
INTRAMUSCULAR | Status: DC | PRN
  Administered 2017-02-27: 30 mL

## 2017-02-27 MED ORDER — MIDAZOLAM HCL 2 MG/2ML IJ SOLN
INTRAMUSCULAR | Status: AC
  Filled 2017-02-27: qty 2

## 2017-02-27 MED ORDER — SODIUM CHLORIDE 0.9% FLUSH: 3.0000 mL | Freq: Two times a day (BID) | INTRAVENOUS | Status: DC

## 2017-02-27 MED ORDER — FENTANYL CITRATE (PF) 100 MCG/2ML IJ SOLN
25.0000 ug | INTRAMUSCULAR | Status: DC | PRN
  Administered 2017-02-27 (×3): 50 ug via INTRAVENOUS

## 2017-02-27 MED ORDER — FENTANYL CITRATE (PF) 250 MCG/5ML IJ SOLN
INTRAMUSCULAR | Status: DC | PRN
  Administered 2017-02-27: 50 ug via INTRAVENOUS
  Administered 2017-02-27 (×2): 25 ug via INTRAVENOUS
  Administered 2017-02-27 (×3): 50 ug via INTRAVENOUS

## 2017-02-27 MED ORDER — OXYCODONE-ACETAMINOPHEN 5-325 MG PO TABS: 1.0000 | ORAL_TABLET | Freq: Four times a day (QID) | ORAL | 0 refills | Status: AC | PRN

## 2017-02-27 MED ORDER — EPHEDRINE SULFATE-NACL 50-0.9 MG/10ML-% IV SOSY
PREFILLED_SYRINGE | INTRAVENOUS | Status: DC | PRN
  Administered 2017-02-27: 5 mg via INTRAVENOUS

## 2017-02-27 MED ORDER — SODIUM CHLORIDE 0.9 % IV SOLN: 250.0000 mL | INTRAVENOUS | Status: DC | PRN

## 2017-02-27 MED ORDER — SODIUM CHLORIDE 0.9% FLUSH: 3.0000 mL | INTRAVENOUS | Status: DC | PRN

## 2017-02-27 SURGICAL SUPPLY — 41 items
BAG DECANTER FOR FLEXI CONT (MISCELLANEOUS) ×2 IMPLANT
BINDER BREAST XLRG (GAUZE/BANDAGES/DRESSINGS) ×4 IMPLANT
BIOPATCH RED 1 DISK 7.0 (GAUZE/BANDAGES/DRESSINGS) ×6 IMPLANT
CANISTER SUCT 3000ML PPV (MISCELLANEOUS) ×2 IMPLANT
CHLORAPREP W/TINT 26ML (MISCELLANEOUS) ×2 IMPLANT
CONT SPECI 4OZ STER CLIK (MISCELLANEOUS) ×2 IMPLANT
COVER SURGICAL LIGHT HANDLE (MISCELLANEOUS) ×2 IMPLANT
DERMABOND ADVANCED (GAUZE/BANDAGES/DRESSINGS) ×2
DERMABOND ADVANCED .7 DNX12 (GAUZE/BANDAGES/DRESSINGS) ×2 IMPLANT
DRAIN CHANNEL 19F RND (DRAIN) ×4 IMPLANT
DRAPE HALF SHEET 40X57 (DRAPES) ×4 IMPLANT
DRAPE ORTHO SPLIT 77X108 STRL (DRAPES) ×2
DRAPE SURG 17X23 STRL (DRAPES) ×8 IMPLANT
DRAPE SURG ORHT 6 SPLT 77X108 (DRAPES) ×2 IMPLANT
DRSG PAD ABDOMINAL 8X10 ST (GAUZE/BANDAGES/DRESSINGS) ×4 IMPLANT
ELECT BLADE 4.0 EZ CLEAN MEGAD (MISCELLANEOUS) ×2
ELECT REM PT RETURN 9FT ADLT (ELECTROSURGICAL) ×2
ELECTRODE BLDE 4.0 EZ CLN MEGD (MISCELLANEOUS) ×1 IMPLANT
ELECTRODE REM PT RTRN 9FT ADLT (ELECTROSURGICAL) ×1 IMPLANT
EVACUATOR SILICONE 100CC (DRAIN) ×4 IMPLANT
GLOVE BIO SURGEON STRL SZ 6.5 (GLOVE) ×4 IMPLANT
GLOVE BIOGEL PI IND STRL 6.5 (GLOVE) ×6 IMPLANT
GLOVE BIOGEL PI INDICATOR 6.5 (GLOVE) ×6
GOWN STRL REUS W/ TWL LRG LVL3 (GOWN DISPOSABLE) ×2 IMPLANT
GOWN STRL REUS W/TWL LRG LVL3 (GOWN DISPOSABLE) ×2
KIT BASIN OR (CUSTOM PROCEDURE TRAY) ×2 IMPLANT
KIT ROOM TURNOVER OR (KITS) ×2 IMPLANT
NEEDLE 18GX1X1/2 (RX/OR ONLY) (NEEDLE) ×2 IMPLANT
NEEDLE HYPO 25GX1X1/2 BEV (NEEDLE) ×2 IMPLANT
NS IRRIG 1000ML POUR BTL (IV SOLUTION) ×2 IMPLANT
PACK GENERAL/GYN (CUSTOM PROCEDURE TRAY) ×2 IMPLANT
PAD ABD 8X10 STRL (GAUZE/BANDAGES/DRESSINGS) ×4 IMPLANT
PAD ARMBOARD 7.5X6 YLW CONV (MISCELLANEOUS) ×2 IMPLANT
PIN SAFETY STERILE (MISCELLANEOUS) ×2 IMPLANT
SUT MNCRL AB 3-0 PS2 18 (SUTURE) ×4 IMPLANT
SUT MNCRL AB 4-0 PS2 18 (SUTURE) ×6 IMPLANT
SUT MON AB 5-0 PS2 18 (SUTURE) ×4 IMPLANT
SWAB COLLECTION DEVICE MRSA (MISCELLANEOUS) ×2 IMPLANT
SWAB CULTURE ESWAB REG 1ML (MISCELLANEOUS) ×2 IMPLANT
TOWEL OR 17X24 6PK STRL BLUE (TOWEL DISPOSABLE) ×2 IMPLANT
TOWEL OR 17X26 10 PK STRL BLUE (TOWEL DISPOSABLE) ×2 IMPLANT

## 2017-02-27 NOTE — Op Note (Signed)
OPERATIVE NOTE  DATE OF OPERATION: 02/27/2017  LOCATION: [facility]  PREOPERATIVE DIAGNOSIS: Status Post Tissue Expander Placement for breast reconstruction  POSTOPERATIVE DIAGNOSIS: Same  PROCEDURE:  Removal of bilateral breast tissue expander    SURGEON: Lyndee Leo Sanger Dillingham, DO  ASSISTANT: Shawn Rayburn, PA  ANESTHESIA: General and Local  EBL: Minimal  CONDITION: Stable  COMPLICATIONS: None  INDICATION:  The patient, Susan Benton, is a 46 y.o. year old female born on 01-08-1971, here for treatment of seroma and possible infection of right breast expander.  Patient has decided to have both expanders removed. [id number]  PROCEDURE DETAILS:  The patient was taken to the operating room and placed on the operating room table in the supine position.  General anesthetic was administered.  All bone prominent areas were padded.  The involved area was prepped and draped in a sterile fashion using a betadine prep.  A time out was called and all information was confirmed to be correct by all in the room.  Local anesthetic was injected for intraoperative hemostasis and postoperative pain control.    Left:  The previous incision was excised.  The bovie was used to dissect down to the muscle and free the superior and inferior flaps.  The bovie was then used to enter the capsule.  The expander was released from the tissue and removed completely.  The area was irrigated with antibiotic solution and saline.  The #15 blade was used to place the drain.  The drain was secured with the 4-0 Silk.  The ADM was well incorporated.  The bovie was used to scratch the capsule. The deep layers were closed with 3-0 Monocryl.  The subcutaneous tissue was re-approximated with 4.0 Monocryl.  The skin edges were closed with a running 5-0 Monocryl.  The area was then washed off and derma bond applied.  A protective dressing was applied.   Right:  The previous incision was excised.  The  bovie was used to dissect down to the muscle and free the superior and inferior flaps.  The bovie was then used to enter the capsule.  The expander was released from the tissue and removed completely.  Cultures were obtained.  There was a mild amount of thick yellow seroma. The area was irrigated with antibiotic solution and saline.  The #15 blade was used to place the drain.  The drain was secured with the 4-0 Silk.  The ADM was well incorporated.  The bovie was used to scratch the capsule. The deep layers were closed with 3-0 Monocryl.  The subcutaneous tissue was re-approximated with 4.0 Monocryl.  The skin edges were closed with a running 5-0 Monocryl.  The area was then washed off and derma bond applied.  A protective dressing was applied.       The patient tolerated the procedure well.  There were no complication.  The patient was allowed to wake from anesthesia and taken to the recovery room in stable condition. The family was notified at the end of the case.

## 2017-02-27 NOTE — Anesthesia Postprocedure Evaluation (Signed)
Anesthesia Post Note  Patient: Susan Benton  Procedure(s) Performed: REMOVAL TISSUE EXPANDER (Bilateral )     Patient location during evaluation: PACU Anesthesia Type: General Level of consciousness: awake and alert Pain management: pain level controlled Vital Signs Assessment: post-procedure vital signs reviewed and stable Respiratory status: spontaneous breathing, nonlabored ventilation and respiratory function stable Cardiovascular status: blood pressure returned to baseline and stable Postop Assessment: no apparent nausea or vomiting Anesthetic complications: no    Last Vitals:  Vitals:   02/27/17 1415 02/27/17 1425  BP: (!) 130/91 (!) 133/97  Pulse:  74  Resp:  14  Temp:  36.4 C  SpO2:  98%    Last Pain:  Vitals:   02/27/17 1425  TempSrc:   PainSc: [place]

## 2017-02-27 NOTE — Discharge Instructions (Signed)
Exercises Following Breast Surgery The following exercises are recommended and safe to do after you have surgery on your breast or your lymph nodes near your armpit (axillary nodes). These exercises may improve your ability to move your chest, shoulders, and back (mobility). This can help to relieve pain, swelling, and stiffness that you may experience after surgery. Exercises Ask your health care provider which exercises are safe for you. Do exercises exactly as told by your health care provider and adjust them as directed. You should not feel pain when you do these exercises. Stop right away if you feel any pain. Do not begin these exercises until told by your health care provider. Exercise A: Deep breathing  1. Lie down on your back. You may bend your knees for comfort. 2. Slowly breathe in as much air as you can and expand your chest and abdomen. ? Think about pushing your belly button away from your spine while you do this. ? It may help to put your hands on your belly so you can feel it expand. 3. Slowly breathe out. Repeat these steps 4-5 times or the number of times that is comfortable for you. Exercise B: Wand exercise  1. Lie down on your back. You may bend your knees for comfort. 2. Position your hands so your thumbs are pointing to each other. With both hands, hold a wand-shaped object, such as a broom handle or a cane. Your hands should be about shoulder width apart on the object. 3. Start with the object resting across your hips. 4. Slowly lift the wand toward the ceiling. Use your unaffected arm to help lift the wand. If it is comfortable for you, lengthen the movement to go from your hips to over your head. Repeat these steps 5-7 times. Exercise C: Elbow winging  1. Lie down on your back. You may bend your knees for comfort. 2. Clasp your hands together behind your head so that your elbows point toward the ceiling. 3. Keep your hands together and move your elbows apart and down  toward the floor as far as you comfortably can. Repeat these steps 5-7 times. Exercise D: Swelling reduction, lying down 1. Lie down on your back. You may bend your knees for comfort. 2. Raise (elevate) your affected arm above the level of your heart and keep it elevated during the exercise. You may rest your arm on top of pillows to make this easier. 3. Open and close your hand 15-25 times in a row. 4. Bend and straighten your elbow 15-25 times in a row. 5. You may keep your arm elevated for up to 45 minutes at a time. This helps to reduce swelling. Do this exercise 2-4 times a day. Exercise E: Swelling reduction, sitting  1. Hold a tennis ball, a rolled-up towel, or a similar object in your hand on your affected side. 2. Slowly squeeze the ball or towel. Try to do this using only your hand muscles. 3. Relax your hand. Repeat these steps 15-25 times. Exercise F: Shoulder blade stretch  1. Sit in a chair, facing a table. Your back should be against the back of the chair. 2. Place your unaffected arm on the table with your palm down and your elbow bent. This arm will support you and will not move during the exercise. 3. Place your affected arm on the table with your palm down and your elbow straight. 4. Slide your affected arm forward, toward the opposite side of the table, but avoid leaning  forward. ? While you do this, you should feel your shoulder blade on your affected side move away from the back of the chair. ? You may put a towel under your hand to help it slide more easily. Repeat these steps 5-7 times. Exercise G: Shoulder blade squeeze  1. Sit in a stable chair with good posture. Avoid letting your back touch the back of the chair. 2. Your arms should be at your sides with your elbows bent. You may rest your forearms on a pillow. 3. Squeeze your shoulder blades together. Think about trying to bring them down and back. ? Keep your shoulders level. ? Do not lift your shoulders up  toward your ears. 4. Relax your muscles completely before you repeat this exercise. Repeat these steps 5-7 times. Exercise H: Side bend  1. Sit in a stable chair with your feet flat on the floor. You may put your feet shoulder-width apart to help you feel more stable. 2. Clasp your hands together in your lap and lift your hands slowly over your head until your arms are straight. You may use your unaffected arm to help lift your other arm. 3. With your arms above your head, bend at your waist to your right so you feel a stretch in your left side. 4. Straighten your abdomen andmove your arms back to the center, above your head. 5. Repeat step 3 on the other side of your body, by bending to the left until you feel a stretch in your right side. Repeat these steps 5-7 times. Exercise I: Chest wall stretch  1. Stand facing a corner. Put one foot near the corner and the other foot about 18 inches (45 cm) behind the forward foot. It does not matter which foot is forward. 2. Bend your elbows and place your forearms on the wall, one on each side of the corner. Your elbows should be as close to shoulder height as possible. 3. Keep your body straight as you move your hips toward the corner. Do not let your shoulders move up toward your ears. Repeat these steps 5-7 times. Exercise J: Wall climb, flexion  1. Stand facing a wall with your toes about 8-10 inches (20-25 cm) from the wall. 2. Place your hands on the wall, about shoulder width apart. 3. Move your hands up the wall and toward the ceiling. ? Do not let your shoulders shrug. ? Do not arch your back. Repeat these steps 5-7 times. Exercise K: Wall climb, abduction 1. Stand about 18 inches (45.7 cm) from a wall, with your affected side facing the wall. 2. Bend the elbow of your arm on your affected side, and place your hand on the wall. 3. Move your hand up the wall and toward the ceiling. Do not let your shoulders shrug. Repeat these steps 5-7  times. Contact a health care provider if:  You feel like you are getting weaker.  You develop: ? New pain or swelling. ? A feeling of heaviness in your arm. ? Headaches that are different than usual. ? Numbness or tingling in your arms or chest.  You become dizzy.  You have blurry vision. This information is not intended to replace advice given to you by your health care provider. Make sure you discuss any questions you have with your health care provider. Document Released: 12/03/2005 Document Revised: 01/10/2016 Document Reviewed: 01/23/2015 Elsevier Interactive Patient Education  2018 [place] No driving while on pain meds Full range of motion Sink  bath

## 2017-02-27 NOTE — H&P (Signed)
Susan Benton is an 46 y.o. female.   Chief Complaint: acquired absence of breasts bilateral HPI: The patient is a 47 yrs old wf here for removal of the right tissue expander but decided to have removal of both.  The right side is red and swollen.  She underwent antibiotic treatment with improvement but has decided to have the expanders removed.  Past Medical History:  Diagnosis Date  . Anxiety   . Arthritis   . Depression 2013  . Fibroids, submucosal 10/04/2016  . Headache   . Hep C w/o coma, chronic ([place]) 06/27/2014   had treatment   . Hx of right and left heart catheterization 2014   had cath in [place], no intervention  . Hypertension 1987  . Invasive ductal carcinoma of breast ([place]) 10/14/2016  . Low iron   . Restless legs syndrome     Past Surgical History:  Procedure Laterality Date  . AXILLARY SENTINEL NODE BIOPSY Right 11/20/2016   Procedure: AXILLARY SENTINEL NODE BIOPSY;  Surgeon: Aviva Signs, MD;  Location: [facility];  Service: General;  Laterality: Right;  Sentinel Node Injection @ 10:30am  . BREAST RECONSTRUCTION WITH PLACEMENT OF TISSUE EXPANDER AND FLEX HD (ACELLULAR HYDRATED DERMIS) Bilateral 12/25/2016   Procedure: BREAST RECONSTRUCTION WITH PLACEMENT OF TISSUE EXPANDER AND FLEX HD (ACELLULAR HYDRATED DERMIS);  Surgeon: Wallace Going, DO;  Location: [facility];  Service: Plastics;  Laterality: Bilateral;  . CARDIAC CATHETERIZATION    . DILITATION & CURRETTAGE/HYSTROSCOPY WITH NOVASURE ABLATION N/A 10/23/2016   Procedure: DILATATION & CURETTAGE/HYSTEROSCOPY WITH NOVASURE ENDOMETRIAL ABLATION;  Surgeon: Florian Buff, MD;  Location: [facility];  Service: Gynecology;  Laterality: N/A;  . MASTECTOMY MODIFIED RADICAL Right 10/23/2016   Procedure: MASTECTOMY MODIFIED RADICAL;  Surgeon: Aviva Signs, MD;  Location: [facility];  Service: General;  Laterality: Right;  . SIMPLE MASTECTOMY WITH AXILLARY SENTINEL NODE BIOPSY Left 10/23/2016   Procedure: SIMPLE  MASTECTOMY;  Surgeon: Aviva Signs, MD;  Location: [facility];  Service: General;  Laterality: Left;  . TONSILECTOMY, ADENOIDECTOMY, BILATERAL MYRINGOTOMY AND TUBES Bilateral 1984  . TUBAL LIGATION  1998   2000    Family History  Problem Relation Age of Onset  . Hypertension Mother   . Cancer Mother   . Diabetes Father   . Hypertension Father   . Kidney disease Father   . Hyperlipidemia Father   . Asthma Father   . COPD Father   . Heart disease Father   . HIV/AIDS Brother   . Cancer Maternal Grandmother        breast  . Breast cancer Maternal Grandmother   . Hypertension Son    Social History:  reports that she has been smoking Cigarettes.  She has a 5.00 pack-year smoking history. She has never used smokeless tobacco. She reports that she does not drink alcohol or use drugs.  Allergies: No Known Allergies  Medications Prior to Admission  Medication Sig Dispense Refill  . ALPRAZolam (XANAX) 0.5 MG tablet Take 2 tablets (1 mg total) by mouth 3 (three) times daily as needed for anxiety. (Patient taking differently: Take 0.5 mg by mouth 2 (two) times daily as needed for anxiety. ) 30 tablet 1  . atenolol (TENORMIN) 100 MG tablet Take 1 tablet (100 mg total) by mouth daily. 30 tablet 1  . ciprofloxacin (CIPRO) 500 MG tablet Take 500 mg by mouth 2 (two) times daily. For 7 days.  Started Monday 10/1 morning.    . cyclobenzaprine (FLEXERIL) 10 MG tablet  Take 10 mg by mouth 3 (three) times daily as needed for muscle spasms.  0  . gabapentin (NEURONTIN) 300 MG capsule TAKE ONE CAPSULE BY MOUTH 3 TIMES A DAY (Patient taking differently: TAKE ONE CAPSULE BY MOUTH AT BEDTIME) 90 capsule 2  . lisinopril (PRINIVIL,ZESTRIL) 10 MG tablet TAKE 1 TABLET(10 MG) BY MOUTH DAILY 30 tablet 11  . meloxicam (MOBIC) 15 MG tablet Take 15 mg by mouth daily as needed.  1  . rOPINIRole (REQUIP) 2 MG tablet 1-2 tabs po qhs (Patient taking differently: Take 2 mg by mouth at bedtime. ) 60 tablet 3  . tamoxifen  (NOLVADEX) 20 MG tablet Take 1 tablet (20 mg total) by mouth daily. (Patient taking differently: Take 20 mg by mouth at bedtime. ) 90 tablet 0  . venlafaxine XR (EFFEXOR-XR) 75 MG 24 hr capsule Take 1 capsule (75 mg total) by mouth daily with breakfast. 30 capsule 2  . diazepam (VALIUM) 2 MG tablet Take 1 tablet (2 mg total) by mouth every 8 (eight) hours as needed for muscle spasms. (Patient not taking: Reported on 02/25/2017) 30 tablet 0  . gabapentin (NEURONTIN) 100 MG capsule Take 200 mg by mouth at bedtime. If needed when the 300 mg isn't enough for restless leg syndrome    . oxyCODONE-acetaminophen (PERCOCET/ROXICET) 5-325 MG tablet Take 1 tablet by mouth every 6 (six) hours as needed for severe pain. (Patient not taking: Reported on 02/25/2017) 30 tablet 0  . oxyCODONE-acetaminophen (ROXICET) 5-325 MG tablet Take 1 tablet by mouth every 6 (six) hours as needed for severe pain. (Patient not taking: Reported on 02/25/2017) 28 tablet 0    Results for orders placed or performed during the hospital encounter of 02/27/17 (from the past 48 hour(s))  Basic metabolic panel     Status: Abnormal   Collection Time: 02/27/17  9:24 AM  Result Value Ref Range   Sodium 134 (L) 135 - 145 mmol/L   Potassium 4.3 3.5 - 5.1 mmol/L   Chloride 103 101 - 111 mmol/L   CO2 20 (L) 22 - 32 mmol/L   Glucose, Bld 91 65 - 99 mg/dL   BUN 15 6 - 20 mg/dL   Creatinine, Ser 0.58 0.44 - 1.00 mg/dL   Calcium 8.6 (L) 8.9 - 10.3 mg/dL   GFR calc non Af Amer >60 >60 mL/min   GFR calc Af Amer >60 >60 mL/min    Comment: (NOTE) The eGFR has been calculated using the CKD EPI equation. This calculation has not been validated in all clinical situations. eGFR's persistently <60 mL/min signify possible Chronic Kidney Disease.    Anion gap 11 5 - 15  CBC     Status: Abnormal   Collection Time: 02/27/17  9:24 AM  Result Value Ref Range   WBC 10.2 4.0 - 10.5 K/uL   RBC 4.81 3.87 - 5.11 MIL/uL   Hemoglobin 12.2 12.0 - 15.0 g/dL    HCT 37.9 36.0 - 46.0 %   MCV 78.8 78.0 - 100.0 fL   MCH 25.4 (L) 26.0 - 34.0 pg   MCHC 32.2 30.0 - 36.0 g/dL   RDW 15.9 (H) 11.5 - 15.5 %   Platelets 326 150 - 400 K/uL  hCG, serum, qualitative     Status: None   Collection Time: 02/27/17  9:24 AM  Result Value Ref Range   Preg, Serum NEGATIVE NEGATIVE    Comment:        THE SENSITIVITY OF THIS METHODOLOGY IS >10 mIU/mL.  No results found.  Review of Systems  Constitutional: Negative.   HENT: Negative.   Eyes: Negative.   Respiratory: Negative.   Cardiovascular: Negative.   Gastrointestinal: Negative.   Genitourinary: Negative.   Musculoskeletal: Negative.   Skin: Negative.   Neurological: Negative.   Psychiatric/Behavioral: Negative.     Blood pressure (!) 148/84, pulse 68, temperature 98.2 F (36.8 C), temperature source Oral, resp. rate 18, weight 116 kg (255 lb 11.7 oz), last menstrual period 01/21/2017, SpO2 100 %. Physical Exam  Constitutional: She is oriented to person, place, and time. She appears well-developed and well-nourished.  HENT:  Head: Normocephalic and atraumatic.  Eyes: Pupils are equal, round, and reactive to light. Conjunctivae and EOM are normal.  Cardiovascular: Normal rate.   Respiratory: Effort normal.  GI: Soft. She exhibits no distension.  Neurological: She is alert and oriented to person, place, and time.  Skin: Skin is warm. No rash noted. There is erythema.  Psychiatric: She has a normal mood and affect. Her behavior is normal. Judgment and thought content normal.     Assessment/Plan Removal of bilateral breast expanders.  Wallace Going, DO 02/27/2017, 11:47 AM

## 2017-02-27 NOTE — Transfer of Care (Signed)
Immediate Anesthesia Transfer of Care Note  Patient: Susan Benton  Procedure(s) Performed: REMOVAL TISSUE EXPANDER (Bilateral )  Patient Location: PACU  Anesthesia Type:General  Level of Consciousness: awake, alert , oriented and patient cooperative  Airway & Oxygen Therapy: Patient Spontanous Breathing and Patient connected to nasal cannula oxygen  Post-op Assessment: Report given to RN, Post -op Vital signs reviewed and stable and Patient moving all extremities X 4  Post vital signs: Reviewed and stable  Last Vitals:  Vitals:   02/27/17 0919  BP: (!) 148/84  Pulse: 68  Resp: 18  Temp: 36.8 C  SpO2: 100%    Last Pain:  Vitals:   02/27/17 0940  TempSrc:   PainSc: 3          Complications: No apparent anesthesia complications

## 2017-02-27 NOTE — Anesthesia Procedure Notes (Signed)
Procedure Name: LMA Insertion Date/Time: 02/27/2017 12:06 PM Performed by: Bryson Corona Pre-anesthesia Checklist: Patient identified, Emergency Drugs available, Suction available and Patient being monitored Patient Re-evaluated:Patient Re-evaluated prior to induction Oxygen Delivery Method: Circle system utilized Preoxygenation: Pre-oxygenation with 100% oxygen Induction Type: IV induction Ventilation: Mask ventilation without difficulty LMA: LMA inserted LMA Size: 4.0 Number of attempts: 1 Airway Equipment and Method: Patient positioned with wedge pillow Tube secured with: Tape Dental Injury: Teeth and Oropharynx as per pre-operative assessment

## 2017-02-27 NOTE — Anesthesia Preprocedure Evaluation (Addendum)
Anesthesia Evaluation  Patient identified by MRN, date of birth, ID band Patient awake    Reviewed: Allergy & Precautions, H&P , NPO status , Patient's Chart, lab work & pertinent test results, reviewed documented beta blocker date and time   Airway Mallampati: III  TM Distance: >3 FB Neck ROM: Full    Dental  (+) Teeth Intact, Dental Advisory Given   Pulmonary Current Smoker,    Pulmonary exam normal breath sounds clear to auscultation       Cardiovascular hypertension, Pt. on medications and On Home Beta Blockers  Rhythm:Regular Rate:Normal  TTE 10/2016 - Mild to moderate LVH. Ejection fraction was   in the range of 60% to 65%.    Neuro/Psych Anxiety Depression Hx RLS    GI/Hepatic negative GI ROS, (+) Hepatitis -, C  Endo/Other  Obesity  Renal/GU negative Renal ROS  negative genitourinary   Musculoskeletal  (+) Arthritis , Osteoarthritis,    Abdominal (+) + obese,   Peds  Hematology negative hematology ROS (+)   Anesthesia Other Findings   Reproductive/Obstetrics negative OB ROS                            Anesthesia Physical  Anesthesia Plan  ASA: III  Anesthesia Plan: General   Post-op Pain Management:    Induction: Intravenous  PONV Risk Score and Plan: 3 and Ondansetron, Dexamethasone, Midazolam and Scopolamine patch - Pre-op  Airway Management Planned: LMA  Additional Equipment: None  Intra-op Plan:   Post-operative Plan: Extubation in OR  Informed Consent: I have reviewed the patients History and Physical, chart, labs and discussed the procedure including the risks, benefits and alternatives for the proposed anesthesia with the patient or authorized representative who has indicated his/her understanding and acceptance.   Dental advisory given  Plan Discussed with: CRNA  Anesthesia Plan Comments:        Anesthesia Quick Evaluation

## 2017-02-27 NOTE — Progress Notes (Signed)
Report given to erika rn as caregiver 

## 2017-02-28 ENCOUNTER — Encounter (HOSPITAL_COMMUNITY): Payer: Self-pay | Admitting: Plastic Surgery

## 2017-03-04 LAB — AEROBIC/ANAEROBIC CULTURE (SURGICAL/DEEP WOUND)

## 2017-03-04 LAB — AEROBIC/ANAEROBIC CULTURE W GRAM STAIN (SURGICAL/DEEP WOUND)

## 2017-03-10 ENCOUNTER — Telehealth: Payer: Self-pay | Admitting: Obstetrics & Gynecology

## 2017-03-10 ENCOUNTER — Encounter: Payer: Self-pay | Admitting: *Deleted

## 2017-03-26 ENCOUNTER — Emergency Department (HOSPITAL_COMMUNITY): Payer: Self-pay

## 2017-03-26 ENCOUNTER — Encounter (HOSPITAL_COMMUNITY): Payer: Self-pay | Admitting: Emergency Medicine

## 2017-03-26 ENCOUNTER — Emergency Department (HOSPITAL_COMMUNITY)
Admission: EM | Admit: 2017-03-26 | Discharge: 2017-03-26 | Disposition: A | Discharge status: Home / Self Care | Payer: Self-pay | Attending: Emergency Medicine | Admitting: Emergency Medicine

## 2017-03-26 DIAGNOSIS — M25561 Pain in right knee: Secondary | ICD-10-CM | POA: Insufficient documentation

## 2017-03-26 DIAGNOSIS — F17210 Nicotine dependence, cigarettes, uncomplicated: Secondary | ICD-10-CM | POA: Insufficient documentation

## 2017-03-26 DIAGNOSIS — Z79899 Other long term (current) drug therapy: Secondary | ICD-10-CM | POA: Diagnosis not present

## 2017-03-26 DIAGNOSIS — I10 Essential (primary) hypertension: Secondary | ICD-10-CM | POA: Diagnosis not present

## 2017-03-26 DIAGNOSIS — M79604 Pain in right leg: Secondary | ICD-10-CM

## 2017-03-26 LAB — D-DIMER, QUANTITATIVE: D-Dimer, Quant: 0.87 ug/mL-FEU — ABNORMAL HIGH (ref 0.00–0.50)

## 2017-03-26 MED ORDER — METHOCARBAMOL 500 MG PO TABS: 500.0000 mg | ORAL_TABLET | Freq: Every evening | ORAL | 0 refills | Status: DC | PRN

## 2017-03-26 MED ORDER — HYDROCODONE-ACETAMINOPHEN 5-325 MG PO TABS: 2.0000 | ORAL_TABLET | Freq: Four times a day (QID) | ORAL | 0 refills | Status: DC | PRN

## 2017-03-26 MED ORDER — KETOROLAC TROMETHAMINE 60 MG/2ML IM SOLN
30.0000 mg | Freq: Once | INTRAMUSCULAR | Status: AC
  Administered 2017-03-26: 30 mg via INTRAMUSCULAR
  Filled 2017-03-26: qty 2

## 2017-03-26 MED ORDER — ENOXAPARIN SODIUM 120 MG/0.8ML ~~LOC~~ SOLN
1.0000 mg/kg | Freq: Once | SUBCUTANEOUS | Status: AC
  Administered 2017-03-26: 115 mg via SUBCUTANEOUS
  Filled 2017-03-26: qty 0.8

## 2017-03-26 MED ORDER — HYDROCODONE-ACETAMINOPHEN 5-325 MG PO TABS
2.0000 | ORAL_TABLET | Freq: Once | ORAL | Status: AC
  Administered 2017-03-26: 2 via ORAL
  Filled 2017-03-26: qty 2

## 2017-03-26 MED ORDER — NAPROXEN 500 MG PO TABS: 500.0000 mg | ORAL_TABLET | Freq: Two times a day (BID) | ORAL | 0 refills | Status: DC

## 2017-03-26 NOTE — Discharge Instructions (Signed)
As discussed, return in the morning for an ultrasound to rule out blood clot in your leg. Wear your knee sleeve and take pain medicine as needed. Do not drive or operate machinery while taking these medications. Follow up with your primary care provider.

## 2017-03-26 NOTE — ED Provider Notes (Signed)
[facility] EMERGENCY DEPARTMENT Provider Note   CSN: [id number] Arrival date & time: 03/26/17  1738     History   Chief Complaint Chief Complaint  Patient presents with  . Knee Pain    HPI Susan Benton is a 46 y.o. female presenting with right knee pain  worsening over the last 2 weeks. Patient reports that 2 months ago she was seen at [facility] and had an x-ray which revealed arthritis and effusion. She took ibuprofen wore a knee sleeve and symptoms resolved completely. She explains that symptoms returned about 2 weeks ago and the pain has been unbearable.  She was recently diagnosed with breast cancer and had a double mastectomy with most recent surgery on October 4th 2018.  she is currently on tamoxifen and has had reduced mobility. Denies history of DVT/PE.  No fever, chills, erythema, warmth to the touch, calf pain or unilateral leg swelling. Pain is at anterior thigh superior to patella aggravated by flexing the knee and walking. Constant burning pain with sharp shooting pains when walking. No numbness or wealkness. Denies any injury or trauma.   HPI  Past Medical History:  Diagnosis Date  . Anxiety   . Arthritis   . Depression 2013  . Fibroids, submucosal 10/04/2016  . Headache   . Hep C w/o coma, chronic ([place]) 06/27/2014   had treatment   . Hx of right and left heart catheterization 2014   had cath in [place], no intervention  . Hypertension 1987  . Invasive ductal carcinoma of breast ([place]) 10/14/2016  . Low iron   . Restless legs syndrome     Patient Active Problem List   Diagnosis Date Noted  . Acquired absence of breast and absent nipple, left 12/25/2016  . S/P right mastectomy 10/23/2016  . Malignant neoplasm of upper-outer quadrant of right female breast ([place])   . Family history of breast cancer   . Fibroids, submucosal 10/04/2016  . Menstrual cramps 09/25/2016  . Menorrhagia with regular cycle 09/25/2016  . Mass of upper outer quadrant of right breast  09/25/2016  . Depression   . Hypertension     Past Surgical History:  Procedure Laterality Date  . AXILLARY SENTINEL NODE BIOPSY Right 11/20/2016   Procedure: AXILLARY SENTINEL NODE BIOPSY;  Surgeon: Aviva Signs, MD;  Location: [facility];  Service: General;  Laterality: Right;  Sentinel Node Injection @ 10:30am  . BREAST RECONSTRUCTION WITH PLACEMENT OF TISSUE EXPANDER AND FLEX HD (ACELLULAR HYDRATED DERMIS) Bilateral 12/25/2016   Procedure: BREAST RECONSTRUCTION WITH PLACEMENT OF TISSUE EXPANDER AND FLEX HD (ACELLULAR HYDRATED DERMIS);  Surgeon: Wallace Going, DO;  Location: [facility];  Service: Plastics;  Laterality: Bilateral;  . CARDIAC CATHETERIZATION    . DILITATION & CURRETTAGE/HYSTROSCOPY WITH NOVASURE ABLATION N/A 10/23/2016   Procedure: DILATATION & CURETTAGE/HYSTEROSCOPY WITH NOVASURE ENDOMETRIAL ABLATION;  Surgeon: [place] Buff, MD;  Location: [facility];  Service: Gynecology;  Laterality: N/A;  . MASTECTOMY MODIFIED RADICAL Right 10/23/2016   Procedure: MASTECTOMY MODIFIED RADICAL;  Surgeon: Aviva Signs, MD;  Location: [facility];  Service: General;  Laterality: Right;  . SIMPLE MASTECTOMY WITH AXILLARY SENTINEL NODE BIOPSY Left 10/23/2016   Procedure: SIMPLE MASTECTOMY;  Surgeon: Aviva Signs, MD;  Location: [facility];  Service: General;  Laterality: Left;  . TISSUE EXPANDER PLACEMENT Bilateral 02/27/2017   Procedure: REMOVAL TISSUE EXPANDER;  Surgeon: Wallace Going, DO;  Location: [facility];  Service: Plastics;  Laterality: Bilateral;  . TONSILECTOMY, ADENOIDECTOMY, BILATERAL MYRINGOTOMY AND TUBES Bilateral 1984  .  TUBAL LIGATION  1998   2000    OB History    Gravida Para Term Preterm AB Living   2 2           SAB TAB Ectopic Multiple Live Births                   Home Medications    Prior to Admission medications   Medication Sig Start Date End Date Taking? Authorizing Provider  ALPRAZolam Duanne Moron) 0.5 MG tablet Take 2 tablets (1 mg total) by mouth 3  (three) times daily as needed for anxiety. Patient taking differently: Take 0.5 mg by mouth 2 (two) times daily as needed for anxiety.  11/26/16   Aviva Signs, MD  atenolol (TENORMIN) 100 MG tablet Take 1 tablet (100 mg total) by mouth daily. 11/05/16   Aviva Signs, MD  ciprofloxacin (CIPRO) 500 MG tablet Take 500 mg by mouth 2 (two) times daily. For 7 days.  Started Monday 10/1 morning.    [provider]  gabapentin (NEURONTIN) 100 MG capsule Take 200 mg by mouth at bedtime. If needed when the 300 mg isn't enough for restless leg syndrome    [provider]  gabapentin (NEURONTIN) 300 MG capsule TAKE ONE CAPSULE BY MOUTH 3 TIMES A DAY Patient taking differently: TAKE ONE CAPSULE BY MOUTH AT BEDTIME 01/06/17   Susy Frizzle, MD  HYDROcodone-acetaminophen (NORCO/VICODIN) 5-325 MG tablet Take 2 tablets by mouth every 6 (six) hours as needed for severe pain. 03/26/17   Avie Echevaria B, PA-C  lisinopril (PRINIVIL,ZESTRIL) 10 MG tablet TAKE 1 TABLET(10 MG) BY MOUTH DAILY 04/30/16   Susy Frizzle, MD  meloxicam (MOBIC) 15 MG tablet Take 15 mg by mouth daily as needed. 11/17/16   [provider]  methocarbamol (ROBAXIN) 500 MG tablet Take 1 tablet (500 mg total) by mouth at bedtime as needed for muscle spasms. 03/26/17   Avie Echevaria B, PA-C  naproxen (NAPROSYN) 500 MG tablet Take 1 tablet (500 mg total) by mouth 2 (two) times daily with a meal. 03/26/17   Avie Echevaria B, PA-C  rOPINIRole (REQUIP) 2 MG tablet 1-2 tabs po qhs Patient taking differently: Take 2 mg by mouth at bedtime.  07/11/16   Susy Frizzle, MD  tamoxifen (NOLVADEX) 20 MG tablet Take 1 tablet (20 mg total) by mouth daily. Patient taking differently: Take 20 mg by mouth at bedtime.  01/28/17   Holley Bouche, NP  venlafaxine XR (EFFEXOR-XR) 75 MG 24 hr capsule Take 1 capsule (75 mg total) by mouth daily with breakfast. 02/01/17   Holley Bouche, NP    Family History Family History    Problem Relation Age of Onset  . Hypertension Mother   . Cancer Mother   . Diabetes Father   . Hypertension Father   . Kidney disease Father   . Hyperlipidemia Father   . Asthma Father   . COPD Father   . Heart disease Father   . HIV/AIDS Brother   . Cancer Maternal Grandmother        breast  . Breast cancer Maternal Grandmother   . Hypertension Son     Social History Social History  Substance Use Topics  . Smoking status: Current Every Day Smoker    Packs/day: 0.25    Years: 20.00    Types: Cigarettes    Last attempt to quit: 07/20/2016  . Smokeless tobacco: Never Used     Comment: 3-4 per day  . Alcohol  use No     Allergies   Patient has no known allergies.   Review of Systems Review of Systems  Constitutional: Negative for chills and fever.  Respiratory: Negative for cough, choking, chest tightness, shortness of breath, wheezing and stridor.   Cardiovascular: Negative for chest pain and leg swelling.  Gastrointestinal: Negative for nausea and vomiting.  Musculoskeletal: Positive for arthralgias, joint swelling and myalgias.  Skin: Negative for color change, pallor, rash and wound.  Neurological: Negative for weakness and numbness.     Physical Exam Updated Vital Signs BP 138/72   Pulse 74   Temp 97.9 F (36.6 C) (Oral)   Resp 18   Ht 5\' 9"  (1.753 m)   Wt 112.5 kg (248 lb)   SpO2 98%   BMI 36.62 kg/m   Physical Exam  Constitutional: She appears well-developed and well-nourished. No distress.  Afebrile, non-toxic appearing, lying comfortably in bed in no acute distress.  HENT:  Head: Normocephalic and atraumatic.  Eyes: Conjunctivae are normal.  Neck: Neck supple.  Cardiovascular: Normal rate, regular rhythm, normal heart sounds and intact distal pulses.   No murmur heard. Pulmonary/Chest: Effort normal and breath sounds normal. No respiratory distress. She has no wheezes. She has no rales. She exhibits no tenderness.  Musculoskeletal: Normal  range of motion. She exhibits tenderness. She exhibits no edema or deformity.  Full rom. Stable knee joint. No erythema, warmth or edema. ttp over the patellar tendon.  Neurological: She is alert. No sensory deficit. She exhibits normal muscle tone.  Skin: Skin is warm and dry. Capillary refill takes less than 2 seconds. No rash noted. She is not diaphoretic. No erythema. No pallor.  Psychiatric: She has a normal mood and affect.  Nursing note and vitals reviewed.    ED Treatments / Results  Labs (all labs ordered are listed, but only abnormal results are displayed) Labs Reviewed  D-DIMER, QUANTITATIVE (NOT AT [facility]) - Abnormal; Notable for the following:       Result Value   D-Dimer, Quant 0.87 (*)    All other components within normal limits    EKG  EKG Interpretation None       Radiology Dg Knee Complete 4 Views Right  Result Date: 03/26/2017 CLINICAL DATA:  Anterior knee pain worsening recently. EXAM: RIGHT KNEE - COMPLETE 4+ VIEW COMPARISON:  None. FINDINGS: Minimal medial compartment narrowing. Otherwise examination is normal. No visible effusion. No focal finding otherwise. IMPRESSION: Normal except for mild medial compartment joint space narrowing consistent with mild cartilage loss. Electronically Signed   By: Nelson Chimes M.D.   On: 03/26/2017 18:50    Procedures Procedures (including critical care time)  Medications Ordered in ED Medications  ketorolac (TORADOL) injection 30 mg (30 mg Intramuscular Given 03/26/17 1823)  HYDROcodone-acetaminophen (NORCO/VICODIN) 5-325 MG per tablet 2 tablet (2 tablets Oral Given 03/26/17 2111)  enoxaparin (LOVENOX) injection 115 mg (115 mg Subcutaneous Given 03/26/17 2112)     Initial Impression / Assessment and Plan / ED Course  I have reviewed the triage vital signs and the nursing notes.  Pertinent labs & imaging results that were available during my care of the patient were reviewed by me and considered in my medical  decision making (see chart for details).     Given patient's risk factors for DVT, ordered dimer. Low suspicion for DVT based on hx and exam findings. No posterior pain, edema, unilateral swelling, calf ttp.  Low suspicion for septic arthritis, no erythema, warmth, fever, edema, patient has  full rom.  Hx and exam findings more consistent with msk, patellar tendon pathology.  Xray negative for effusion or acute findings  [place] protocol indicated and discussed with patient.   Dimer positive and patient has multiple risk factor for DVT. Given one does of lovenox in ED and dc with [place] scheduled for tomorrow morning return. Discharge home with symptomatic relief and follow up with PCP.  Discussed strict return precautions and advised to return to the emergency department if experiencing any new or worsening symptoms. Instructions were understood and patient agreed with discharge plan.  Final Clinical Impressions(s) / ED Diagnoses   Final diagnoses:  Acute pain of right knee    New Prescriptions Discharge Medication List as of 03/26/2017  9:05 PM    START taking these medications   Details  HYDROcodone-acetaminophen (NORCO/VICODIN) 5-325 MG tablet Take 2 tablets by mouth every 6 (six) hours as needed for severe pain., Starting Wed 03/26/2017, Print    methocarbamol (ROBAXIN) 500 MG tablet Take 1 tablet (500 mg total) by mouth at bedtime as needed for muscle spasms., Starting Wed 03/26/2017, Print    naproxen (NAPROSYN) 500 MG tablet Take 1 tablet (500 mg total) by mouth 2 (two) times daily with a meal., Starting Wed 03/26/2017, Print         Emeline General, PA-C 03/27/17 0159    Quintella Reichert, MD 03/29/17 [phone]

## 2017-03-26 NOTE — ED Triage Notes (Addendum)
Pt reports right knee pain for last several weeks. Pt reports seen for same in the past and was diagnosed with arthritis in right knee x2 months ago. Pt denies any known injury. Pt reports right knee "has fluid on it." pt able to ambulate on RLE.

## 2017-03-27 ENCOUNTER — Ambulatory Visit (HOSPITAL_COMMUNITY)
Admission: RE | Admit: 2017-03-27 | Discharge: 2017-03-27 | Disposition: A | Discharge status: Home / Self Care | Payer: BLUE CROSS/BLUE SHIELD | Source: Ambulatory Visit | Attending: Emergency Medicine | Admitting: Emergency Medicine

## 2017-03-27 DIAGNOSIS — M79604 Pain in right leg: Secondary | ICD-10-CM | POA: Diagnosis not present

## 2017-03-28 NOTE — Progress Notes (Signed)
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  ER+ (100%), PR+ (100%), Ki67 10%, HER2 neg (ratio 1.03).       10/23/2016 Surgery    Bilateral mastectomies Arnoldo Morale)      10/23/2016 Pathology Results    RIGHT mastectomy: Multifocal IDC, grade 1, spanning 4.6 cm & 0.6 cm. Low grade DCIS. Negative margins. HER2 repeated and neg (ratio 1.36).  LEFT mastectomy: Fibroadenoma, no malignancy.       11/20/2016 Surgery    (R) axillary sentinel lymph node biopsy Arnoldo Morale)      11/20/2016 Pathology Results    0/1 axillary sentinel LN biopsy for carcinoma.       11/28/2016 Oncotype testing    Recurrence score: 9 (low risk); associated with 6% risk of distant recurrence.       12/03/2016 Imaging    CT chest: IMPRESSION: 1. 7.7 cm lobulated fluid attenuation collection in the right axilla, could represent a postoperative fluid collection, with or without presence of infection. Further management will need to be based on clinical grounds. Could correlate with targeted sonography and aspiration as indicated. 2. Clear lung fields 3. Hepatic steatosis 4. Aortic Atherosclerosis      12/13/2016 Miscellaneous    BCI genomic testing revealed LOW risk of recurrence with 4.3% risk and LOW likelihood of benefit of extension of therapy beyond 5 years.          INTERVAL HISTORY:  Ms. Eldredge 46 y.o. female returns  for follow-up for right breast cancer.   Chart reviewed. Since her last visit to the cancer center, she had surgery to remove both breast tissue expanders with her plastic surgeon, Dr. Marla Roe, on 02/27/17.  She had ED visit on 03/26/17 for (R) knee pain; her D-dimer was positive so a dose of Lovenox was given with [place] scheduled as outpatient. (R) LE doppler ultrasound performed on 03/27/17 negative for DVT.    Here today unaccompanied. Overall, she tells me she has been feeling very tired; she reports having no energy. Appetite 100%.    Her biggest complaint today right chest wall pain s/p recent expander removal surgeries.  She describes the pain as "a thousand bees stinging me in the same spot over and over."  States that her pain was so severe last night, she took 2 capsules gabapentin at bedtime. She woke up a few hours later in excruciating pain & took 2 Percocet tabs which gave her a bit of relief.  Generally, she has been taking gabapentin, previously only once per day. In recent days, she has increased to 1 cap (300 mg) in the AM and 2 caps (600 mg) at bedtime. She shares with me that she saw Dr. Marla Roe yesterday "and she released me. I don't have to go back there."  Dr. Marla Roe is reportedly referring her to a neurosurgeon for a nerve block for her intense chest wall pain.  States that her bowels are moving well daily without any difficulty; denies constipation.  She takes a "keto cleanser" which is helpful in keeping her bowels moving regularly.    Started Tamoxifen about 6 weeks ago. Tolerating well; she has some hot flashes, but they are manageable "now that the weather is getting cooler."  Reports having some N&V initially, which she attributes to the Tamoxifen. Her symptoms are improving; she has required a few doses of Zofran.  "And I always have to take a Zofran with the pain medicines because they make me nauseated."  Reports 1 episode of very light vaginal bleeding since  starting Tamoxifen, "but it was a really small amount."    She tells me "these past few weeks have been rough."  She had breast reconstruction surgery on 12/25/16 with Dr. Marla Roe; she has been struggling with (R) chest wall redness/infection, being managed by Dr. Marla Roe; currently on Bactrim. She has required several different occasions of having fluid removed from her expander on the (R) d/t edema and associated infection.  Denies any fever/chills.  Continues to have intermittent hot flashes; currently on Effexor XR 75 mg daily.  Due to all of this, she states that her energy levels are very low.  Appetite 75%. She just feels very weak and tired.  Otherwise, she is largely without any other physical complaints.   Reports that she is now on disability.  She has applied for Medicaid, but has not received her insurance information/card yet.  She would like some help from our social workers to see if they may be able "to push the process along."  She has food stamps now, which she is happy about.  She has a current open workers comp claim and will be having   She is requesting refills of Percocet and Xanax today. States that previously her PCP refilled these medications, but she is required to see him for an office visit for refills and "it costs me over $100 to see him for a visit since I don't have insurance."  States that she had urine drug screen at her PCP's office before her refill was given. She states that she has not signed any controlled substance contract with her PCP.        REVIEW OF SYSTEMS:  Review of Systems  Constitutional: Positive for fatigue. Negative for chills and fever.  HENT:  Negative.   Eyes: Negative.   Respiratory: Negative.   Cardiovascular: Negative.   Gastrointestinal: Positive for nausea and vomiting. Negative for abdominal pain, blood in stool, constipation and diarrhea.  Endocrine: Positive for hot flashes.  Genitourinary: Positive for vaginal bleeding.  Negative for dysuria and hematuria.   Skin: Negative.   Neurological: Positive for numbness.  Hematological: Negative.   Psychiatric/Behavioral: Positive for depression. The patient is nervous/anxious.      PAST MEDICAL/SURGICAL HISTORY:  Past Medical History:  Diagnosis Date  . Anxiety   . Arthritis   . Depression 2013  . Fibroids, submucosal 10/04/2016  . Headache   . Hep C w/o coma, chronic ([place]) 06/27/2014   had treatment   . Hx of right and left heart catheterization 2014   had cath in [place], no intervention  . Hypertension 1987  . Invasive ductal carcinoma of breast ([place]) 10/14/2016  . Low iron   . Restless legs syndrome    Past Surgical History:  Procedure Laterality Date  . CARDIAC CATHETERIZATION    . TONSILECTOMY, ADENOIDECTOMY, BILATERAL MYRINGOTOMY AND TUBES Bilateral 1984  . TUBAL  LIGATION  1998   2000     SOCIAL HISTORY:  Social History   Socioeconomic History  . Marital status: Married    Spouse name: Not on file  . Number of children: Not on file  . Years of education: Not on file  . Highest education level: Not on file  Social Needs  . Financial resource strain: Not on file  . Food insecurity - worry: Not on file  . Food insecurity - inability: Not on file  . Transportation needs - medical: Not on file  . Transportation needs - non-medical: Not on file  Occupational History  . Not on file  Tobacco Use  . Smoking status: Current Every Day Smoker    Packs/day: 0.25    Years: 20.00    Pack years: 5.00    Types: Cigarettes    Last attempt to quit: 07/20/2016    Years since quitting: 0.6  . Smokeless tobacco: Never Used  . Tobacco comment: 3-4 per day  Substance and Sexual Activity  . Alcohol use: No  . Drug use: No  . Sexual activity: Yes    Birth control/protection: Surgical    Comment: tubal  Other Topics Concern  . Not on file  Social History Narrative  . Not on file    FAMILY HISTORY:  Family History  Problem Relation Age of  Onset  . Hypertension Mother   . Cancer Mother   . Diabetes Father   . Hypertension Father   . Kidney disease Father   . Hyperlipidemia Father   . Asthma Father   . COPD Father   . Heart disease Father   . HIV/AIDS Brother   . Cancer Maternal Grandmother        breast  . Breast cancer Maternal Grandmother   . Hypertension Son     CURRENT MEDICATIONS:  Outpatient Encounter Medications as of 04/01/2017  Medication Sig  . ALPRAZolam (XANAX) 0.5 MG tablet Take 1 tablet (0.5 mg total) 2 (two) times daily as needed by mouth for anxiety.  Marland Kitchen atenolol (TENORMIN) 100 MG tablet Take 1 tablet (100 mg total) by mouth daily.  Marland Kitchen gabapentin (NEURONTIN) 100 MG capsule Take 200 mg by mouth at bedtime. If needed when the 300 mg isn't enough for restless leg syndrome  . gabapentin (NEURONTIN) 300 MG capsule TAKE ONE CAPSULE BY MOUTH 3 TIMES A DAY (Patient taking differently: TAKE ONE CAPSULE BY MOUTH AT BEDTIME)  . lisinopril (PRINIVIL,ZESTRIL) 10 MG tablet TAKE 1 TABLET(10 MG) BY MOUTH DAILY  . meloxicam (MOBIC) 15 MG tablet Take 15 mg by mouth daily as needed.  . methocarbamol (ROBAXIN) 500 MG tablet Take 1 tablet (500 mg total) by mouth at bedtime as needed for muscle spasms.  . naproxen (NAPROSYN) 500 MG tablet Take 1 tablet (500 mg total) by mouth 2 (two) times daily with a meal.  . rOPINIRole (REQUIP) 2 MG tablet 1-2 tabs po qhs (Patient taking differently: Take 2 mg by mouth at bedtime. )  . tamoxifen (NOLVADEX) 20 MG tablet Take 1 tablet (20 mg total) by mouth daily. (Patient taking differently: Take 20 mg by mouth at bedtime. )  . venlafaxine XR (EFFEXOR-XR) 75 MG 24 hr capsule Take 1 capsule (75 mg total) by mouth daily with breakfast.  . [DISCONTINUED] ALPRAZolam (XANAX) 0.5 MG tablet Take 2 tablets (1 mg total) by mouth 3 (three) times daily as needed for anxiety. (Patient taking differently: Take 0.5 mg by mouth 2 (two) times daily as needed for  anxiety. )  . [DISCONTINUED]  HYDROcodone-acetaminophen (NORCO/VICODIN) 5-325 MG tablet Take 2 tablets by mouth every 6 (six) hours as needed for severe pain.  Marland Kitchen oxyCODONE-acetaminophen (PERCOCET/ROXICET) 5-325 MG tablet Take 1 tablet every 6 (six) hours as needed by mouth for severe pain.  . [DISCONTINUED] ciprofloxacin (CIPRO) 500 MG tablet Take 500 mg by mouth 2 (two) times daily. For 7 days.  Started Monday 10/1 morning.   No facility-administered encounter medications on file as of 04/01/2017.     ALLERGIES:  No Known Allergies   PHYSICAL EXAM:  ECOG Performance status: 1 - Symptomatic; remains independent.   Vitals:   04/01/17 0837  BP: (!) 152/98  Pulse: 76  Resp: 16  Temp: 97.8 F (36.6 C)  SpO2: 97%   Filed Weights   04/01/17 0837  Weight: 254 lb (115.2 kg)    Physical Exam  Constitutional: She is oriented to person, place, and time and well-developed, well-nourished, and in no distress.  HENT:  Head: Normocephalic.  Mouth/Throat: Oropharynx is clear and moist. No oropharyngeal exudate.  Eyes: Conjunctivae are normal. Pupils are equal, round, and reactive to light. No scleral icterus.  Neck: Normal range of motion. Neck supple.  Cardiovascular: Normal rate and regular rhythm.  Pulmonary/Chest: Effort normal and breath sounds normal. No respiratory distress.    Abdominal: Soft. Bowel sounds are normal. There is no tenderness.  Musculoskeletal: She exhibits no edema.  Mild decreased (R) shoulder ROM (h/o right torn rotator cuff injury per patient)   Lymphadenopathy:    She has no cervical adenopathy.       Right: No supraclavicular adenopathy present.       Left: No supraclavicular adenopathy present.  Neurological: She is alert and oriented to person, place, and time. No cranial nerve deficit. Gait normal.  Skin: Skin is warm and dry. No rash noted.  Psychiatric: Mood, memory, affect and judgment normal.  Nursing note and vitals reviewed.      LABORATORY DATA:  I have reviewed the  labs as listed.  CBC    Component Value Date/Time   WBC 10.2 02/27/2017 0924   RBC 4.81 02/27/2017 0924   HGB 12.2 02/27/2017 0924   HCT 37.9 02/27/2017 0924   PLT 326 02/27/2017 0924   MCV 78.8 02/27/2017 0924   MCH 25.4 (L) 02/27/2017 0924   MCHC 32.2 02/27/2017 0924   RDW 15.9 (H) 02/27/2017 0924   LYMPHSABS 2.0 12/17/2016 1045   MONOABS 0.6 12/17/2016 1045   EOSABS 0.3 12/17/2016 1045   BASOSABS 0.0 12/17/2016 1045   CMP Latest Ref Rng & Units 02/27/2017 12/17/2016 12/03/2016  Glucose 65 - 99 mg/dL 91 93 101(H)  BUN 6 - 20 mg/dL _0 Creatinine 0.44 - 1.00 mg/dL 0.58 0.55 0.54  Sodium 135 - 145 mmol/L 134(L) 136 138  Potassium 3.5 - 5.1 mmol/L 4.3 4.3 4.2  Chloride 101 - 111 mmol/L 103 103 106  CO2 22 - 32 mmol/L 20(L) 23 22  Calcium 8.9 - 10.3 mg/dL 8.6(L) 9.3 9.3  Total Protein 6.5 - 8.1 g/dL - 7.7 8.3(H)  Total Bilirubin 0.3 - 1.2 mg/dL - 0.6 0.2(L)  Alkaline Phos 38 - 126 U/L - 65 63  AST 15 - 41 U/L - 25 29  ALT 14 - 54 U/L - 28 33    PENDING LABS:    DIAGNOSTIC IMAGING:  *The following radiologic images and reports have been reviewed independently and agree with below findings.  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 Patient elected to have bilateral mastectomies with Dr. Arnoldo Morale on 10/23/16, along with D&C/hysteroscopy with ablation (endometrial path benign); she later had right axillary sentinel lymph node biopsy on 11/20/16 and 0/1 LNs.   Oncotype DX score 9 (low risk), indicating no benefit from chemotherapy.  Referred to Dr. Quitman Livings in [place] at [facility] for radiation consideration and he felt radiation was not warranted.  Underwent bilateral breast reconstruction with Dr.  Marla Roe on 12/25/16. Serum hormone levels in 11/2016 indicated pre-menopausal status.  Started Tamoxifen in mid-01/2017.   -Remains on Tamoxifen with good tolerance. She has hot flashes, which are currently manageable with Effexor XR 75 mg.  Breast Cancer Index (BCI) results revealed LOW risk of recurrence and LOW likelihood of benefit for anti-estrogen therapy beyond 5 years.  Tamoxifen in patient's who have undergone bilateral mastectomies can help reduce risk of distant recurrence.  Therefore, she will take Tamoxifen through 01/2022.  -Clinical breast exam performed today and negative for recurrence.   -No role for mammography given bilateral mastectomies.   -Return to cancer center in 4 months for follow-up.   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   -Currently on gabapentin with only minimal relief. Recommended she take 300 mg gabapentin in the AM and 600 mg at bedtime, as she reports her pain is worse at night.  She could also take a 300 mg dose in early afternoon as needed. Encouraged her to monitor her symptoms and can take gabapentin up to TID.   -Percocet has been helpful. She may have synergistic pain relief from opiates with gabapentin.  She understands the importance of not taking more than prescribed d/t risk for dependence/addiction. However, her pain is substantial and warrants opiate therapy in the short-term.  -[place] Controlled Substance Reporting System reviewed.  Her last opiate refills were given by an ED physician, her PCP, and her plastic surgeon.  Shared with her the concerns from receiving pain medications from different providers. Recommended she only receive her opiates from the cancer center going forward.  She agreed to this plan.  My hope is that her pain will improve with time and continued healing and she will require less opiates to manage her pain.   -Paper prescription for Percocet 5/325 po Q6Hprn, #40, no refills  given to patient today.    Also recommended she continue current bowel regimen to prevent opiate-induced constipation.      Anxiety with depression:  -Continue Effexor XR 75 mg daily; reports that she feels like her mood is overall stable, but she does have episodes of depressive and anxiety symptoms.   -Xanax 0.5 mg helpful for her anxiety. Again, [place] Controlled Substance Reporting System reviewed. She received her last Rx from her PCP, Dr. [place] Crane which was filled on 03/11/17; she was given #60 tabs, which was a 30-day supply for that prescription.  Given that she currently has no insurance and reportedly has to pay >$100 for a visit with her PCP, I will refill her Xanax today.   -Paper prescription for Xanax 0.5 mg po BIDprn, #60, 1 refill.  She hopes to have her insurance issues resolved  within the next month or so.   -Encouraged her to increase her physical activity as well, as this will provide some symptomatic improvement in her mood and her overall health as well.    Insurance concerns:  -Patient is currently uninsured. Her [place] Medicaid is pending.  She is requesting some assistance from our social work team re: her insurance.  I will send a message to our social workers [place] in [place] as they are currently covering our patients.       Dispo:  -Return to cancer center in 4 months for follow-up.    All questions were answered to patient's stated satisfaction. Encouraged patient to call with any new concerns or questions before her next visit to the cancer center and we can certain see her sooner, if needed.    Plan of care discussed with Dr. Talbert Cage, who agrees with the above aforementioned.      Orders placed this encounter:  No orders of the defined types were placed in this encounter.     Mike Craze, NP [place] [phone]

## 2017-04-01 ENCOUNTER — Encounter (HOSPITAL_COMMUNITY): Payer: Self-pay | Admitting: Adult Health

## 2017-04-01 ENCOUNTER — Encounter (HOSPITAL_COMMUNITY): Payer: Self-pay | Attending: Oncology | Admitting: Adult Health

## 2017-04-01 ENCOUNTER — Encounter: Payer: Self-pay | Admitting: *Deleted

## 2017-04-01 VITALS — BP 152/98 | HR 76 | Temp 97.8°F | Resp 16 | Ht 69.0 in | Wt 254.0 lb

## 2017-04-01 DIAGNOSIS — N951 Menopausal and female climacteric states: Secondary | ICD-10-CM

## 2017-04-01 DIAGNOSIS — N644 Mastodynia: Secondary | ICD-10-CM

## 2017-04-01 DIAGNOSIS — F419 Anxiety disorder, unspecified: Secondary | ICD-10-CM

## 2017-04-01 DIAGNOSIS — R110 Nausea: Secondary | ICD-10-CM

## 2017-04-01 DIAGNOSIS — Z170 Estrogen receptor positive status [ER+]: Secondary | ICD-10-CM

## 2017-04-01 DIAGNOSIS — R0789 Other chest pain: Secondary | ICD-10-CM

## 2017-04-01 DIAGNOSIS — C50411 Malignant neoplasm of upper-outer quadrant of right female breast: Secondary | ICD-10-CM | POA: Insufficient documentation

## 2017-04-01 DIAGNOSIS — F418 Other specified anxiety disorders: Secondary | ICD-10-CM

## 2017-04-01 DIAGNOSIS — Z720 Tobacco use: Secondary | ICD-10-CM

## 2017-04-01 MED ORDER — OXYCODONE-ACETAMINOPHEN 5-325 MG PO TABS: 1.0000 | ORAL_TABLET | Freq: Four times a day (QID) | ORAL | 0 refills | Status: DC | PRN

## 2017-04-01 MED ORDER — ALPRAZOLAM 0.5 MG PO TABS: 0.5000 mg | ORAL_TABLET | Freq: Two times a day (BID) | ORAL | 1 refills | Status: DC | PRN

## 2017-04-01 NOTE — Progress Notes (Signed)
[place] Clinical Social Work  Clinical Social Work was referred by Mike Craze, NP, regarding insurance concerns. Clinical Social Worker attempted to contact patient to offer support and assess for needs.  CSW left voicemail for patient to return call.   Maryjean Morn, MSW, LCSW, OSW-C Clinical Social Worker [facility] [phone]

## 2017-04-01 NOTE — Patient Instructions (Signed)
[place] at [facility] Discharge Instructions  RECOMMENDATIONS MADE BY THE CONSULTANT AND ANY TEST RESULTS WILL BE SENT TO YOUR REFERRING PHYSICIAN.  You were seen today by Mike Craze NP. You were given refills on your pain medication and xanax today. Return in 4 months for follow up.   Thank you for choosing [place] at [facility] to provide your oncology and hematology care.  To afford each patient quality time with our provider, please arrive at least 15 minutes before your scheduled appointment time.    If you have a lab appointment with the [facility] please come in thru the  Main Entrance and check in at the main information desk  You need to re-schedule your appointment should you arrive 10 or more minutes late.  We strive to give you quality time with our providers, and arriving late affects you and other patients whose appointments are after yours.  Also, if you no show three or more times for appointments you may be dismissed from the clinic at the providers discretion.     Again, thank you for choosing [facility].  Our hope is that these requests will decrease the amount of time that you wait before being seen by our physicians.       _____________________________________________________________  Should you have questions after your visit to [facility], please contact our office at [phone] between the hours of 8:30 a.m. and 4:30 p.m.  Voicemails left after 4:30 p.m. will not be returned until the following business day.  For prescription refill requests, have your pharmacy contact our office.       Resources For Cancer Patients and their Caregivers ? American Cancer Society: Can assist with transportation, wigs, general needs, runs Look Good Feel Better.        952 157 6100 ? Cancer Care: Provides financial assistance, online support groups, medication/co-pay assistance.   1-800-813-HOPE 650-583-5598) ? [place] Assists [place] cancer patients and their families through emotional , educational and financial support.  (239)288-5541 ? [place] DSS Where to apply for food stamps, Medicaid and utility assistance. 616-651-4638 ? [facility]: Transportation to medical appointments. 579-809-1004 ? Social Security Administration: May apply for disability if have a Stage IV cancer. (586)210-9717 514-827-2018 ? [employer], Disability and Transit Services: Assists with nutrition, care and transit needs. [place] Support Programs: @10RELATIVEDAYS @ > Cancer Support Group  2nd Tuesday of the month 1pm-2pm, Journey Room  > Creative Journey  3rd Tuesday of the month 1130am-1pm, Journey Room  > Look Good Feel Better  1st Wednesday of the month 10am-12 noon, Journey Room (Call [place] to register [phone])

## 2017-04-23 ENCOUNTER — Other Ambulatory Visit (HOSPITAL_COMMUNITY): Payer: Self-pay | Admitting: Adult Health

## 2017-04-23 ENCOUNTER — Encounter (HOSPITAL_COMMUNITY): Payer: Self-pay | Admitting: Adult Health

## 2017-04-23 DIAGNOSIS — Z170 Estrogen receptor positive status [ER+]: Secondary | ICD-10-CM

## 2017-04-23 DIAGNOSIS — C50411 Malignant neoplasm of upper-outer quadrant of right female breast: Secondary | ICD-10-CM

## 2017-04-23 DIAGNOSIS — N644 Mastodynia: Secondary | ICD-10-CM

## 2017-04-23 MED ORDER — OXYCODONE-ACETAMINOPHEN 5-325 MG PO TABS: 1.0000 | ORAL_TABLET | Freq: Four times a day (QID) | ORAL | 0 refills | Status: DC | PRN

## 2017-04-23 NOTE — Progress Notes (Signed)
Patient called cancer center requesting refill of Percocet.   [place] Controlled Substance Reporting System reviewed and refill is appropriate on or after 04/23/17. Paper prescription printed & post-dated; Rx left at cancer center front desk for patient to retrieve after showing photo ID per clinic policy.   NCCSRS reviewed:     Mike Craze, NP [place] [phone]

## 2017-05-21 ENCOUNTER — Other Ambulatory Visit (HOSPITAL_COMMUNITY): Payer: Self-pay | Admitting: Adult Health

## 2017-05-21 ENCOUNTER — Encounter (HOSPITAL_COMMUNITY): Payer: Self-pay | Admitting: Adult Health

## 2017-05-21 DIAGNOSIS — C50411 Malignant neoplasm of upper-outer quadrant of right female breast: Secondary | ICD-10-CM

## 2017-05-21 DIAGNOSIS — Z170 Estrogen receptor positive status [ER+]: Secondary | ICD-10-CM

## 2017-05-21 DIAGNOSIS — N644 Mastodynia: Secondary | ICD-10-CM

## 2017-05-21 MED ORDER — OXYCODONE-ACETAMINOPHEN 5-325 MG PO TABS: 1.0000 | ORAL_TABLET | Freq: Four times a day (QID) | ORAL | 0 refills | Status: DC | PRN

## 2017-05-21 NOTE — Progress Notes (Signed)
Patient called cancer center requesting refill of Percocet.   [place] Controlled Substance Reporting System reviewed and refill is appropriate on or after 05/21/17. However, given that she has required opiate pain medication for 6+ months, I would prefer she have referral back to her surgeon, PCP, and/or pain management specialist for further pain medication prescriptions.  I will provide this prescription for her, but I think she needs additional specialist(s) to help better manage her pain going forward. Paper prescription printed & post-dated; Rx left at cancer center front desk for patient to retrieve after showing photo ID per clinic policy.   NCCSRS reviewed:    Mike Craze, NP [place] [phone]

## 2017-06-06 ENCOUNTER — Other Ambulatory Visit (HOSPITAL_COMMUNITY): Payer: Self-pay | Admitting: Adult Health

## 2017-06-06 DIAGNOSIS — Z170 Estrogen receptor positive status [ER+]: Secondary | ICD-10-CM

## 2017-06-06 DIAGNOSIS — C50411 Malignant neoplasm of upper-outer quadrant of right female breast: Secondary | ICD-10-CM

## 2017-06-06 MED ORDER — TAMOXIFEN CITRATE 20 MG PO TABS: 20.0000 mg | ORAL_TABLET | Freq: Every day | ORAL | 0 refills | Status: DC

## 2017-06-12 ENCOUNTER — Other Ambulatory Visit (HOSPITAL_COMMUNITY): Payer: Self-pay | Admitting: Adult Health

## 2017-06-12 DIAGNOSIS — Z170 Estrogen receptor positive status [ER+]: Secondary | ICD-10-CM

## 2017-06-12 DIAGNOSIS — F419 Anxiety disorder, unspecified: Secondary | ICD-10-CM

## 2017-06-12 DIAGNOSIS — C50411 Malignant neoplasm of upper-outer quadrant of right female breast: Secondary | ICD-10-CM

## 2017-06-13 ENCOUNTER — Encounter (HOSPITAL_COMMUNITY): Payer: Self-pay | Admitting: Adult Health

## 2017-06-13 NOTE — Telephone Encounter (Signed)
Her PCP has been managing her anxiety. She had some insurance challenges that we talked about at her last visit, which is why I gave her refill of Xanax. Please call to see if her insurance issues are resolved.  If they are, then she should get refills for her Xanax from her PCP.   Mike Craze, NP [place] [phone]

## 2017-06-13 NOTE — Telephone Encounter (Signed)
I have e-scribed Xanax #30 pills with no refills to her pharmacy. All future refills should come from her PCP.   Mike Craze, NP [place] [phone]

## 2017-06-13 NOTE — Progress Notes (Signed)
Received refill request from pharmacy for Xanax.   [place] Controlled Substance Reporting System reviewed and refill is appropriate on or after 06/13/17. I asked nursing to follow-up with pt via phone, as her PCP has managed her anxiety in the past. I helped her with Xanax prescription at last visit because she had insurance/financial concerns.  Per nursing documentation, her insurance issues have resolved, but she is not scheduled to see her PCP for 2 weeks.  Therefore, I have refilled her Xanax, but with only #30 pills and no refills. Future Xanax refills should come from her PCP. Medication e-scribed to his pharmacy using [employer]'s 2-step verification process.    NCCSRS reviewed:     Mike Craze, NP [place] [phone]

## 2017-06-13 NOTE — Telephone Encounter (Signed)
Spoke with the patient and she now has insurance but does not see Dr. [place] Crane for 2 weeks.  She was asking if the nurse practitioner could give her enough Xanax to cover her until the PCP appointment.  Instructed the patient the message would be forwarded and we will call her back with an answer.

## 2017-06-26 ENCOUNTER — Ambulatory Visit (HOSPITAL_COMMUNITY): Payer: Medicaid Other | Admitting: Adult Health

## 2017-06-28 DIAGNOSIS — Z853 Personal history of malignant neoplasm of breast: Secondary | ICD-10-CM | POA: Diagnosis not present

## 2017-07-02 ENCOUNTER — Encounter (HOSPITAL_COMMUNITY): Payer: Self-pay | Admitting: Adult Health

## 2017-07-02 ENCOUNTER — Inpatient Hospital Stay (HOSPITAL_COMMUNITY): Payer: BLUE CROSS/BLUE SHIELD | Attending: Adult Health | Admitting: Adult Health

## 2017-07-02 VITALS — BP 137/96 | HR 81 | Temp 97.8°F | Resp 16 | Wt 247.0 lb

## 2017-07-02 DIAGNOSIS — N939 Abnormal uterine and vaginal bleeding, unspecified: Secondary | ICD-10-CM | POA: Diagnosis not present

## 2017-07-02 DIAGNOSIS — M2550 Pain in unspecified joint: Secondary | ICD-10-CM | POA: Insufficient documentation

## 2017-07-02 DIAGNOSIS — G2581 Restless legs syndrome: Secondary | ICD-10-CM | POA: Insufficient documentation

## 2017-07-02 DIAGNOSIS — Z803 Family history of malignant neoplasm of breast: Secondary | ICD-10-CM | POA: Insufficient documentation

## 2017-07-02 DIAGNOSIS — R222 Localized swelling, mass and lump, trunk: Secondary | ICD-10-CM | POA: Insufficient documentation

## 2017-07-02 DIAGNOSIS — G629 Polyneuropathy, unspecified: Secondary | ICD-10-CM | POA: Diagnosis not present

## 2017-07-02 DIAGNOSIS — C50411 Malignant neoplasm of upper-outer quadrant of right female breast: Secondary | ICD-10-CM | POA: Diagnosis not present

## 2017-07-02 DIAGNOSIS — R232 Flushing: Secondary | ICD-10-CM | POA: Diagnosis not present

## 2017-07-02 DIAGNOSIS — F419 Anxiety disorder, unspecified: Secondary | ICD-10-CM | POA: Diagnosis not present

## 2017-07-02 DIAGNOSIS — F17210 Nicotine dependence, cigarettes, uncomplicated: Secondary | ICD-10-CM | POA: Diagnosis not present

## 2017-07-02 DIAGNOSIS — R5383 Other fatigue: Secondary | ICD-10-CM | POA: Diagnosis not present

## 2017-07-02 DIAGNOSIS — I10 Essential (primary) hypertension: Secondary | ICD-10-CM | POA: Insufficient documentation

## 2017-07-02 DIAGNOSIS — K760 Fatty (change of) liver, not elsewhere classified: Secondary | ICD-10-CM | POA: Diagnosis not present

## 2017-07-02 DIAGNOSIS — F329 Major depressive disorder, single episode, unspecified: Secondary | ICD-10-CM | POA: Diagnosis not present

## 2017-07-02 DIAGNOSIS — Z79899 Other long term (current) drug therapy: Secondary | ICD-10-CM | POA: Insufficient documentation

## 2017-07-02 DIAGNOSIS — Z170 Estrogen receptor positive status [ER+]: Secondary | ICD-10-CM | POA: Diagnosis not present

## 2017-07-02 DIAGNOSIS — Z9013 Acquired absence of bilateral breasts and nipples: Secondary | ICD-10-CM | POA: Diagnosis not present

## 2017-07-02 DIAGNOSIS — Z79810 Long term (current) use of selective estrogen receptor modulators (SERMs): Secondary | ICD-10-CM | POA: Diagnosis not present

## 2017-07-02 DIAGNOSIS — I700 Atherosclerosis of aorta: Secondary | ICD-10-CM | POA: Insufficient documentation

## 2017-07-02 NOTE — Patient Instructions (Signed)
[place] at [facility] Discharge Instructions  RECOMMENDATIONS MADE BY THE CONSULTANT AND ANY TEST RESULTS WILL BE SENT TO YOUR REFERRING PHYSICIAN.  Seen by Mike Craze NP Ultrasound ordered Follow up as scheduled with Dr. Walden Field.  Thank you for choosing [place] at [facility] to provide your oncology and hematology care.  To afford each patient quality time with our provider, please arrive at least 15 minutes before your scheduled appointment time.    If you have a lab appointment with the [facility] please come in thru the  Main Entrance and check in at the main information desk  You need to re-schedule your appointment should you arrive 10 or more minutes late.  We strive to give you quality time with our providers, and arriving late affects you and other patients whose appointments are after yours.  Also, if you no show three or more times for appointments you may be dismissed from the clinic at the providers discretion.     Again, thank you for choosing [facility].  Our hope is that these requests will decrease the amount of time that you wait before being seen by our physicians.       _____________________________________________________________  Should you have questions after your visit to [facility], please contact our office at [phone] between the hours of 8:30 a.m. and 4:30 p.m.  Voicemails left after 4:30 p.m. will not be returned until the following business day.  For prescription refill requests, have your pharmacy contact our office.       Resources For Cancer Patients and their Caregivers ? American Cancer Society: Can assist with transportation, wigs, general needs, runs Look Good Feel Better.        (206)264-6513 ? Cancer Care: Provides financial assistance, online support groups, medication/co-pay assistance.  1-800-813-HOPE (734)212-8617) ? [facility] Assists [place] cancer patients and their families through emotional , educational and financial support.  201-783-9547 ? [place] DSS Where to apply for food stamps, Medicaid and utility assistance. 770-701-7613 ? RCATS: Transportation to medical appointments. (825)074-7130 ? Social Security Administration: May apply for disability if have a Stage IV cancer. 785-547-4680 214-028-3792 ? [employer], Disability and Transit Services: Assists with nutrition, care and transit needs. [place] Support Programs: @10RELATIVEDAYS @ > Cancer Support Group  2nd Tuesday of the month 1pm-2pm, Journey Room  > Creative Journey  3rd Tuesday of the month 1130am-1pm, Journey Room  > Look Good Feel Better  1st Wednesday of the month 10am-12 noon, Journey Room (Call [place] to register [phone])

## 2017-07-03 ENCOUNTER — Encounter (HOSPITAL_COMMUNITY): Payer: Self-pay | Admitting: Adult Health

## 2017-07-03 NOTE — Progress Notes (Signed)
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  ER+ (100%), PR+ (100%), Ki67 10%, HER2 neg (ratio 1.03).       10/23/2016 Surgery    Bilateral mastectomies Arnoldo Morale)      10/23/2016 Pathology Results    RIGHT mastectomy: Multifocal IDC, grade 1, spanning 4.6 cm & 0.6 cm. Low grade DCIS. Negative margins. HER2 repeated and neg (ratio 1.36).  LEFT mastectomy: Fibroadenoma, no malignancy.       11/20/2016 Surgery    (R) axillary sentinel lymph node biopsy Arnoldo Morale)      11/20/2016 Pathology Results    0/1 axillary sentinel LN biopsy for carcinoma.       11/28/2016 Oncotype testing    Recurrence score: 9 (low risk); associated with 6% risk of distant recurrence.       12/03/2016 Imaging    CT chest: IMPRESSION: 1. 7.7 cm lobulated fluid attenuation collection in the right axilla, could represent a postoperative fluid collection, with or without presence of infection. Further management will need to be based on clinical grounds. Could correlate with targeted sonography and aspiration as indicated. 2. Clear lung fields 3. Hepatic steatosis 4. Aortic Atherosclerosis      12/13/2016 Miscellaneous    BCI genomic testing revealed LOW risk of recurrence with 4.3% risk and LOW likelihood of benefit of extension of therapy beyond 5 years.          INTERVAL HISTORY:  Ms. Matlack 47 y.o. female returns  for work-in visit for new left chest wall lump.   She tells me that she noticed a lump on her (L) chest wall that started about 2 weeks ago; she tells me it is tender to touch.    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 She has been referred by her surgeon for nerve block, but has not been to that consultation yet.  Remains on gabapentin & Percocet, which are only minimally effective for her pain. She tells me that her (R) chest wall wound has finally healed.   Remains on Tamoxifen; she has hot flashes and arthalgias. Her nausea has improved. Continues to have intermittent mild vaginal bleeding, which has been chronic for her.  Her bleeding has improved since having an ablation in the past, "but I still bleed some."      REVIEW OF SYSTEMS:  Review of Systems  Constitutional: Positive for fatigue. Negative for chills and fever.  HENT:  Negative.   Eyes: Negative.   Respiratory: Negative.   Cardiovascular: Negative.   Gastrointestinal: Negative.   Endocrine: Positive for hot flashes.  Genitourinary: Positive for vaginal bleeding.   Musculoskeletal: Positive for arthralgias.  Skin: Negative.   Neurological: Negative.   Hematological: Negative.   Psychiatric/Behavioral: The patient is nervous/anxious.      PAST MEDICAL/SURGICAL HISTORY:  Past Medical History:  Diagnosis Date  . Anxiety   . Arthritis   . Depression  2013  . Fibroids, submucosal 10/04/2016  . Headache   . Hep C w/o coma, chronic ([place]) 06/27/2014   had treatment   . Hx of right and left heart catheterization 2014   had cath in [place], no intervention  . Hypertension 1987  . Invasive ductal carcinoma of breast ([place]) 10/14/2016  . Low iron   . Restless legs syndrome    Past Surgical History:  Procedure Laterality Date  . AXILLARY SENTINEL NODE BIOPSY Right 11/20/2016   Procedure: AXILLARY SENTINEL NODE BIOPSY;  Surgeon: Aviva Signs, MD;  Location: [facility];  Service: General;  Laterality:  Right;  Sentinel Node Injection @ 10:30am  . BREAST RECONSTRUCTION WITH PLACEMENT OF TISSUE EXPANDER AND FLEX HD (ACELLULAR HYDRATED DERMIS) Bilateral 12/25/2016   Procedure: BREAST RECONSTRUCTION WITH PLACEMENT OF TISSUE EXPANDER AND FLEX HD (ACELLULAR HYDRATED DERMIS);  Surgeon: Wallace Going, DO;  Location: [facility];  Service: Plastics;  Laterality: Bilateral;  . CARDIAC CATHETERIZATION    . DILITATION & CURRETTAGE/HYSTROSCOPY WITH NOVASURE ABLATION N/A 10/23/2016   Procedure: DILATATION & CURETTAGE/HYSTEROSCOPY WITH NOVASURE ENDOMETRIAL ABLATION;  Surgeon: Florian Buff, MD;  Location: [facility];  Service: Gynecology;  Laterality: N/A;  . MASTECTOMY MODIFIED RADICAL Right 10/23/2016   Procedure: MASTECTOMY MODIFIED RADICAL;  Surgeon: Aviva Signs, MD;  Location: [facility];  Service: General;  Laterality: Right;  . SIMPLE MASTECTOMY WITH AXILLARY SENTINEL NODE BIOPSY Left 10/23/2016   Procedure: SIMPLE MASTECTOMY;  Surgeon: Aviva Signs, MD;  Location: [facility];  Service: General;  Laterality: Left;  . TISSUE EXPANDER PLACEMENT Bilateral 02/27/2017   Procedure: REMOVAL TISSUE EXPANDER;  Surgeon: Wallace Going, DO;  Location: [facility];  Service: Plastics;  Laterality: Bilateral;  . TONSILECTOMY, ADENOIDECTOMY, BILATERAL MYRINGOTOMY AND TUBES Bilateral 1984  . TUBAL LIGATION  1998   2000     SOCIAL HISTORY:  Social History   Socioeconomic History  . Marital status: Married    Spouse name: Not on file  . Number of children: Not on file  . Years of education: Not on file  . Highest education level: Not on file  Social Needs  . Financial resource strain: Not on file  . Food insecurity - worry: Not on file  . Food insecurity - inability: Not on file  . Transportation needs - medical: Not on file  . Transportation needs - non-medical: Not on file  Occupational History  . Not on file  Tobacco Use  . Smoking status: Current Every Day Smoker    Packs/day: 0.25     Years: 20.00    Pack years: 5.00    Types: Cigarettes    Last attempt to quit: 07/20/2016    Years since quitting: 0.9  . Smokeless tobacco: Never Used  . Tobacco comment: 3-4 per day  Substance and Sexual Activity  . Alcohol use: No  . Drug use: No  . Sexual activity: Yes    Birth control/protection: Surgical    Comment: tubal  Other Topics Concern  . Not on file  Social History Narrative  . Not on file    FAMILY HISTORY:  Family History  Problem Relation Age of Onset  . Hypertension Mother   . Cancer Mother   . Diabetes Father   . Hypertension Father   . Kidney disease Father   . Hyperlipidemia Father   . Asthma Father   . COPD Father   . Heart disease Father   . HIV/AIDS Brother   . Cancer Maternal Grandmother  breast  . Breast cancer Maternal Grandmother   . Hypertension Son     CURRENT MEDICATIONS:  Outpatient Encounter Medications as of 07/02/2017  Medication Sig  . ALPRAZolam (XANAX) 0.5 MG tablet TAKE 1 TABLET BY MOUTH TWICE A DAY AS NEEDED FOR ANXIETY  . atenolol (TENORMIN) 100 MG tablet Take 1 tablet (100 mg total) by mouth daily.  Marland Kitchen gabapentin (NEURONTIN) 100 MG capsule Take 200 mg by mouth at bedtime. If needed when the 300 mg isn't enough for restless leg syndrome  . gabapentin (NEURONTIN) 300 MG capsule TAKE ONE CAPSULE BY MOUTH 3 TIMES A DAY (Patient taking differently: TAKE ONE CAPSULE BY MOUTH AT BEDTIME)  . lisinopril (PRINIVIL,ZESTRIL) 10 MG tablet TAKE 1 TABLET(10 MG) BY MOUTH DAILY  . meloxicam (MOBIC) 15 MG tablet Take 15 mg by mouth daily as needed.  . methocarbamol (ROBAXIN) 500 MG tablet Take 1 tablet (500 mg total) by mouth at bedtime as needed for muscle spasms.  . naproxen (NAPROSYN) 500 MG tablet Take 1 tablet (500 mg total) by mouth 2 (two) times daily with a meal.  . oxyCODONE-acetaminophen (PERCOCET/ROXICET) 5-325 MG tablet Take 1 tablet by mouth every 6 (six) hours as needed for severe pain.  Marland Kitchen rOPINIRole (REQUIP) 2 MG tablet 1-2  tabs po qhs (Patient taking differently: Take 2 mg by mouth at bedtime. )  . tamoxifen (NOLVADEX) 20 MG tablet Take 1 tablet (20 mg total) by mouth at bedtime.  Marland Kitchen venlafaxine XR (EFFEXOR-XR) 75 MG 24 hr capsule Take 1 capsule (75 mg total) by mouth daily with breakfast.   No facility-administered encounter medications on file as of 07/02/2017.     ALLERGIES:  No Known Allergies   PHYSICAL EXAM:  ECOG Performance status: 1 - Symptomatic; remains independent.   Vitals:   07/02/17 1508  BP: (!) 137/96  Pulse: 81  Resp: 16  Temp: 97.8 F (36.6 C)  SpO2: 99%   Filed Weights   07/02/17 1508  Weight: 247 lb (112 kg)    Physical Exam  Constitutional: She is oriented to person, place, and time and well-developed, well-nourished, and in no distress.  HENT:  Head: Normocephalic.  Eyes: Conjunctivae are normal. No scleral icterus.  Neck: Normal range of motion.  Pulmonary/Chest: Effort normal.    Musculoskeletal: Normal range of motion.  Neurological: She is alert and oriented to person, place, and time.  Skin: Skin is warm and dry. No rash noted.  Psychiatric: Memory and judgment normal.  Mildly anxious affect        LABORATORY DATA:  I have reviewed the labs as listed.  CBC    Component Value Date/Time   WBC 10.2 02/27/2017 0924   RBC 4.81 02/27/2017 0924   HGB 12.2 02/27/2017 0924   HCT 37.9 02/27/2017 0924   PLT 326 02/27/2017 0924   MCV 78.8 02/27/2017 0924   MCH 25.4 (L) 02/27/2017 0924   MCHC 32.2 02/27/2017 0924   RDW 15.9 (H) 02/27/2017 0924   LYMPHSABS 2.0 12/17/2016 1045   MONOABS 0.6 12/17/2016 1045   EOSABS 0.3 12/17/2016 1045   BASOSABS 0.0 12/17/2016 1045   CMP Latest Ref Rng & Units 02/27/2017 12/17/2016 12/03/2016  Glucose 65 - 99 mg/dL 91 93 101(H)  BUN 6 - 20 mg/dL '15 12 13  '$ Creatinine 0.44 - 1.00 mg/dL 0.58 0.55 0.54  Sodium 135 - 145 mmol/L 134(L) 136 138  Potassium 3.5 - 5.1 mmol/L 4.3 4.3 4.2  Chloride 101 - 111 mmol/L 103 103 106  CO2  22  - 32 mmol/L 20(L) 23 22  Calcium 8.9 - 10.3 mg/dL 8.6(L) 9.3 9.3  Total Protein 6.5 - 8.1 g/dL - 7.7 8.3(H)  Total Bilirubin 0.3 - 1.2 mg/dL - 0.6 0.2(L)  Alkaline Phos 38 - 126 U/L - 65 63  AST 15 - 41 U/L - 25 29  ALT 14 - 54 U/L - 28 33    PENDING LABS:    DIAGNOSTIC IMAGING:  *The following radiologic images and reports have been reviewed independently and agree with below findings.  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 Patient elected to have bilateral mastectomies with Dr. Arnoldo Morale on 10/23/16, along with D&C/hysteroscopy with ablation (endometrial path benign); she later had right axillary sentinel lymph node biopsy on 11/20/16 and 0/1 LNs.   Oncotype DX score 9 (low risk), indicating no benefit from chemotherapy.  Referred to Dr. Quitman Livings in [place] at [facility] for radiation consideration and he felt radiation was not warranted.  Underwent bilateral breast reconstruction with Dr. Marla Roe on 12/25/16. Serum hormone levels in 11/2016 indicated pre-menopausal status.  Started Tamoxifen in mid-01/2017.   -Remains on Tamoxifen with good tolerance. She has hot flashes, which are currently manageable with Effexor XR 75 mg.  Breast Cancer Index (BCI) results revealed LOW risk of recurrence and LOW likelihood of benefit for anti-estrogen therapy beyond 5 years.  Tamoxifen in patient's who have undergone bilateral mastectomies can help reduce risk of distant recurrence.  Therefore, she will take Tamoxifen through 01/2022.  -Return to cancer center as scheduled in 07/2017.   (L) chest wall nodule:  -On physical exam, the area of patient concern is without discrete mass. There is what feels like fatty tissue vs fat necrosis  at the medial aspect of the mastectomy scar.  Patient is very anxious about this and would like imaging. Will obtain (L) chest wall ultrasound to rule out malignancy. I shared with her that I have low clinical suspicion that the ultrasound will show any concerning findings, but am happy to obtain imaging to help with her peace of mind. She agrees with this plan.   -Orders placed for (L) chest wall/axilla ultrasound, to be done as soon as able. We will call her with the results when they are available.                Dispo:  -(L) chest wall/axilla ultrasound as soon as able; orders placed today.  -Return to cancer center as scheduled in 07/2017 for routine follow-up.    All questions were answered to patient's stated satisfaction. Encouraged patient to call with any new concerns or questions before her next visit to the cancer center and we can certain see her sooner, if needed.         Orders placed this encounter:  Orders Placed This Encounter  Procedures  . US BREAST LTD UNI LEFT INC AXILLA      Ayelen Sciortino, NP [place] [phone]

## 2017-07-07 DIAGNOSIS — Z853 Personal history of malignant neoplasm of breast: Secondary | ICD-10-CM | POA: Diagnosis not present

## 2017-07-07 DIAGNOSIS — F419 Anxiety disorder, unspecified: Secondary | ICD-10-CM | POA: Diagnosis not present

## 2017-07-07 DIAGNOSIS — I10 Essential (primary) hypertension: Secondary | ICD-10-CM | POA: Diagnosis not present

## 2017-07-08 ENCOUNTER — Ambulatory Visit (HOSPITAL_COMMUNITY)
Admission: RE | Admit: 2017-07-08 | Discharge: 2017-07-08 | Disposition: A | Discharge status: Home / Self Care | Payer: BLUE CROSS/BLUE SHIELD | Source: Ambulatory Visit | Attending: Adult Health | Admitting: Adult Health

## 2017-07-08 DIAGNOSIS — N6489 Other specified disorders of breast: Secondary | ICD-10-CM | POA: Diagnosis not present

## 2017-07-08 DIAGNOSIS — Z9012 Acquired absence of left breast and nipple: Secondary | ICD-10-CM | POA: Diagnosis not present

## 2017-07-08 DIAGNOSIS — R222 Localized swelling, mass and lump, trunk: Secondary | ICD-10-CM

## 2017-07-14 ENCOUNTER — Other Ambulatory Visit (HOSPITAL_COMMUNITY): Payer: Self-pay | Admitting: Adult Health

## 2017-07-14 DIAGNOSIS — Z170 Estrogen receptor positive status [ER+]: Secondary | ICD-10-CM

## 2017-07-14 DIAGNOSIS — R232 Flushing: Secondary | ICD-10-CM

## 2017-07-14 DIAGNOSIS — C50411 Malignant neoplasm of upper-outer quadrant of right female breast: Secondary | ICD-10-CM

## 2017-08-05 ENCOUNTER — Other Ambulatory Visit (HOSPITAL_COMMUNITY): Payer: Self-pay

## 2017-08-05 DIAGNOSIS — C50411 Malignant neoplasm of upper-outer quadrant of right female breast: Secondary | ICD-10-CM

## 2017-08-06 ENCOUNTER — Other Ambulatory Visit (HOSPITAL_COMMUNITY): Payer: Medicaid Other

## 2017-08-06 ENCOUNTER — Ambulatory Visit (HOSPITAL_COMMUNITY): Payer: Medicaid Other | Admitting: Internal Medicine

## 2017-08-12 IMAGING — NM NM SENTINAL NODE INJECTION (BREAST) - NRPT [facility]
1 series · 1 of 1 positions shown · non-contrast
Comparison: none

CLINICAL DATA: RIGHT breast cancer post BILATERAL mastectomy, no
lymph nodes at specimen, for preoperative sentinel node injection
pre axillary lymph node dissection

EXAM:
NUCLEAR MEDICINE BREAST LYMPHOSCINTIGRAPHY RIGHT
TECHNIQUE: Patient has had a preceding RIGHT mastectomy 3 weeks ago.

[Series 1: bone statics · 2.07mm/px · 1 of 1 slices shown]
[im 1/1]
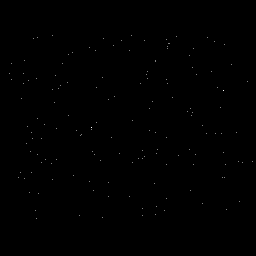

[1 of 1 positions shown; findings below may reference images not displayed]

The skin along the cranial margin of the transverse mastectomy scar
was prepped in the usual sterile fashion.

Topical anesthesia ethyl chloride spray.

0.25 millicuries of tracer were injected at 4 sites along the
cranial margin of the mastectomy scar.

Procedure tolerated well by patient.

No immediate complication.

RADIOPHARMACEUTICALS:  Total of 1 mCi Millipore-filtered
9echnetium-22m sulfur colloid, injected in four aliquots of 0.25 mCi
each.
IMPRESSION: Uncomplicated intradermal injection of a total of 1 mCi
9echnetium-22m sulfur colloid for purposes of RIGHT axillary
sentinel node identification.

## 2017-10-15 ENCOUNTER — Ambulatory Visit (HOSPITAL_COMMUNITY): Payer: BLUE CROSS/BLUE SHIELD | Admitting: Internal Medicine

## 2017-11-11 ENCOUNTER — Other Ambulatory Visit (HOSPITAL_COMMUNITY): Payer: Self-pay | Admitting: Adult Health

## 2017-11-11 DIAGNOSIS — C50411 Malignant neoplasm of upper-outer quadrant of right female breast: Secondary | ICD-10-CM

## 2017-11-11 DIAGNOSIS — Z170 Estrogen receptor positive status [ER+]: Secondary | ICD-10-CM

## 2017-12-09 ENCOUNTER — Encounter (HOSPITAL_COMMUNITY): Payer: Self-pay | Admitting: Internal Medicine

## 2017-12-09 ENCOUNTER — Inpatient Hospital Stay (HOSPITAL_COMMUNITY): Payer: 59 | Attending: Internal Medicine | Admitting: Internal Medicine

## 2017-12-09 VITALS — BP 118/64 | HR 73 | Temp 98.0°F | Resp 16 | Wt 226.9 lb

## 2017-12-09 DIAGNOSIS — Z809 Family history of malignant neoplasm, unspecified: Secondary | ICD-10-CM | POA: Diagnosis not present

## 2017-12-09 DIAGNOSIS — F418 Other specified anxiety disorders: Secondary | ICD-10-CM | POA: Diagnosis not present

## 2017-12-09 DIAGNOSIS — I10 Essential (primary) hypertension: Secondary | ICD-10-CM | POA: Diagnosis not present

## 2017-12-09 DIAGNOSIS — M25559 Pain in unspecified hip: Secondary | ICD-10-CM

## 2017-12-09 DIAGNOSIS — Z9013 Acquired absence of bilateral breasts and nipples: Secondary | ICD-10-CM | POA: Insufficient documentation

## 2017-12-09 DIAGNOSIS — R634 Abnormal weight loss: Secondary | ICD-10-CM

## 2017-12-09 DIAGNOSIS — C50411 Malignant neoplasm of upper-outer quadrant of right female breast: Secondary | ICD-10-CM | POA: Insufficient documentation

## 2017-12-09 DIAGNOSIS — Z79899 Other long term (current) drug therapy: Secondary | ICD-10-CM

## 2017-12-09 DIAGNOSIS — R079 Chest pain, unspecified: Secondary | ICD-10-CM | POA: Insufficient documentation

## 2017-12-09 DIAGNOSIS — R197 Diarrhea, unspecified: Secondary | ICD-10-CM | POA: Insufficient documentation

## 2017-12-09 DIAGNOSIS — Z170 Estrogen receptor positive status [ER+]: Secondary | ICD-10-CM | POA: Insufficient documentation

## 2017-12-09 DIAGNOSIS — K760 Fatty (change of) liver, not elsewhere classified: Secondary | ICD-10-CM | POA: Diagnosis not present

## 2017-12-09 DIAGNOSIS — F17210 Nicotine dependence, cigarettes, uncomplicated: Secondary | ICD-10-CM

## 2017-12-09 DIAGNOSIS — R0789 Other chest pain: Secondary | ICD-10-CM

## 2017-12-09 DIAGNOSIS — Z803 Family history of malignant neoplasm of breast: Secondary | ICD-10-CM | POA: Diagnosis not present

## 2017-12-09 DIAGNOSIS — R110 Nausea: Secondary | ICD-10-CM | POA: Insufficient documentation

## 2017-12-09 DIAGNOSIS — M2550 Pain in unspecified joint: Secondary | ICD-10-CM | POA: Diagnosis not present

## 2017-12-09 NOTE — Patient Instructions (Signed)
[facility] at [facility] Discharge Instructions  You saw Dr. Higgs today.   Thank you for choosing [facility] at [facility] to provide your oncology and hematology care.  To afford each patient quality time with our provider, please arrive at least 15 minutes before your scheduled appointment time.   If you have a lab appointment with the Cancer Center please come in thru the  Main Entrance and check in at the main information desk  You need to re-schedule your appointment should you arrive 10 or more minutes late.  We strive to give you quality time with our providers, and arriving late affects you and other patients whose appointments are after yours.  Also, if you no show three or more times for appointments you may be dismissed from the clinic at the providers discretion.     Again, thank you for choosing [facility].  Our hope is that these requests will decrease the amount of time that you wait before being seen by our physicians.       _____________________________________________________________  Should you have questions after your visit to [facility], please contact our office at [phone] between the hours of 8:30 a.m. and 4:30 p.m.  Voicemails left after 4:30 p.m. will not be returned until the following business day.  For prescription refill requests, have your pharmacy contact our office.       Resources For Cancer Patients and their Caregivers ? American Cancer Society: Can assist with transportation, wigs, general needs, runs Look Good Feel Better.        1-888-227-6333 ? Cancer Care: Provides financial assistance, online support groups, medication/co-pay assistance.  1-800-813-HOPE (4673) ? [facility] Assists [place] cancer patients and their families through emotional , educational and financial support.  336-427-4357 ? [place] DSS Where to apply for food  stamps, Medicaid and utility assistance. 336-342-1394 ? RCATS: Transportation to medical appointments. 336-347-2287 ? Social Security Administration: May apply for disability if have a Stage IV cancer. 336-342-7796 1-800-772-1213 ? [place] Aging, Disability and Transit Services: Assists with nutrition, care and transit needs. 336-349-2343  Cancer Center Support Programs:   > Cancer Support Group  2nd Tuesday of the month 1pm-2pm, Journey Room   > Creative Journey  3rd Tuesday of the month 1130am-1pm, Journey Room     

## 2017-12-09 NOTE — Progress Notes (Signed)
Diagnosis Malignant neoplasm of upper-outer quadrant of right female breast, unspecified estrogen receptor status ([place]) - Plan: CT CHEST W CONTRAST, CT Abdomen Pelvis W Contrast, NM Bone Scan Whole Body  Pain in joint involving pelvic region and thigh, unspecified laterality - Plan: CT CHEST W CONTRAST, CT Abdomen Pelvis W Contrast, NM Bone Scan Whole Body  Chest wall pain - Plan: CT CHEST W CONTRAST, CT Abdomen Pelvis W Contrast, NM Bone Scan Whole Body  Staging Cancer Staging Malignant neoplasm of upper-outer quadrant of right female breast [facility]) Staging form: Breast, AJCC 8th Edition - Pathologic stage from 11/20/2016: Stage IA (pT2(m), pN0, cM0, G1, ER: Positive, PR: Positive, HER2: Negative, Oncotype DX score: 9) - Signed by Holley Bouche, NP on 12/17/2016   Assessment and Plan: 1.  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  Pt is complaining of joint pain and right chest wall pain.  She also has concerns if there is a right chest wall lesion.    Records reviewed and shows she had CT chest without contrast on 12/03/2016 that showed  IMPRESSION: 1. 7.7 cm lobulated fluid attenuation collection in the right axilla, could represent a postoperative fluid collection, with or without presence of infection. Further management will need to be based on clinical grounds. Could correlate with targeted sonography and aspiration as indicated. 2. Clear lung fields 3. Hepatic steatosis  Pt had left breast USN done 07/15/2017 that  was negative.    Due to her ongoing complaints of chest wall concerns and diffuse joint aches, she will be set up for CT CAP as well as a bone scan for further evaluation.  She will RTC in 2 weeks to go over results.    2.  Respiratory issues.  She reports she is scheduled to see PCP today for management.  She continues to smoke and is set up for CT of chest.  Pulse ox is 100% on room air.    3.  Joint pain.  She is  set up for bone scan.  She will follow-up to go over results.    4  Diarrhea and nausea.  She has not undergone colonoscopy.  She is set up for CT of abdomen and pelvis for further evaluation.  She will RTC to go over results.  Pt will likely be referred to GI for evaluation once scans reviewed. Labs from 08/2017 reviewed and showed normal LFTs.    5.  Request for pain medications.  Pt reports she has been receiving pain meds from Dr. Nevada Crane.  She reports she is scheduled to be seen at pain clinic.  I have discussed with her she will need to continue getting pain meds from Prescribing physicians or pain clinic.  She is recommended for workup with CT and bone scans based on symptoms for further evaluation.    6  HTN.  BP is 118/64.  Follow-up with PCP.    7.  Smoking.  Cessation is recommended.  Pt is set up for CT chest due to respiratory complaints and chest wall pain.    8.  Weight loss.  Pt reports 20 pound weight loss since February that was unintentional.  She is set up for CT CAP and pending results, pt will also be referred to GI for evaluation.     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  Pt was previously followed by Dr. Talbert Cage and NP Renato Battles.  She was diagnosed in 09/2016. Patient elected to have bilateral mastectomies with Dr. Arnoldo Morale on 10/23/16, along with D&C/hysteroscopy with ablation (endometrial path benign); she later had right axillary sentinel lymph node biopsy on 11/20/16 and 0/1 LNs.   Oncotype DX score 9 (low risk), indicating no benefit from chemotherapy.  Referred to Dr. Quitman Livings in [place] at [facility] for radiation consideration and he felt radiation was not warranted.  Underwent bilateral breast reconstruction with Dr. Marla Roe on 12/25/16. Serum hormone levels in 11/2016 indicated pre-menopausal status.  Started Tamoxifen in mid-01/2017 and remains on therapy.  Pt had Breast Cancer Index (BCI) testing and  results returned  LOW risk of recurrence and LOW likelihood of benefit for anti-estrogen therapy beyond 5 years. Tamoxifen in patient's who have undergone bilateral mastectomies was recommended to reduce risk of distant recurrence.  She has been recommended to takeTamoxifen through 01/2022.   Current Status:  Pt is seen today for follow-up.  She reports she is having extensive chest wall pain especially on the right.  She is concerned if a lesion is present.  She is complaining of nausea, vomiting, weight loss.  She reports her pain is managed by Dr. Nevada Crane.  She is requesting pain medication.      Malignant neoplasm of upper-outer quadrant of right female breast [facility])    Initial Diagnosis    Malignant neoplasm of upper-outer quadrant of right female breast ([place])      10/08/2016 Initial Biopsy    (R) breast needle biopsy (10:00): Invasive ductal carcinoma, grade 1-2, with DCIS.  ER+ (100%), PR+ (100%), Ki67 10%, HER2 neg (ratio 1.03).       10/23/2016 Surgery    Bilateral mastectomies Arnoldo Morale)      10/23/2016 Pathology Results    RIGHT mastectomy: Multifocal IDC, grade 1, spanning 4.6 cm & 0.6 cm. Low grade DCIS. Negative margins. HER2 repeated and neg (ratio 1.36).  LEFT mastectomy: Fibroadenoma, no malignancy.       11/20/2016 Surgery    (R) axillary sentinel lymph node biopsy Arnoldo Morale)      11/20/2016 Pathology Results    0/1 axillary sentinel LN biopsy for carcinoma.       11/28/2016 Oncotype testing    Recurrence score: 9 (low risk); associated with 6% risk of distant recurrence.       12/03/2016 Imaging    CT chest: IMPRESSION: 1. 7.7 cm lobulated fluid attenuation collection in the right axilla, could represent a postoperative fluid collection, with or without presence of infection. Further management will need to be based on clinical grounds. Could correlate with targeted sonography and aspiration as indicated. 2. Clear lung fields 3. Hepatic steatosis 4. Aortic  Atherosclerosis      12/13/2016 Miscellaneous    BCI genomic testing revealed LOW risk of recurrence with 4.3% risk and LOW likelihood of benefit of extension of therapy beyond 5 years.          Problem List Patient Active Problem List   Diagnosis Date Noted  . Acquired absence of breast and absent nipple, left [Z90.12] 12/25/2016  . S/P right mastectomy [Z90.11] 10/23/2016  . Malignant neoplasm of upper-outer quadrant of right female breast ([employer]) [C50.411]   . Family history of breast cancer [Z80.3]   . Fibroids, submucosal [D25.0] 10/04/2016  . Menstrual cramps [N94.6] 09/25/2016  . Menorrhagia with regular cycle [N92.0] 09/25/2016  . Mass of upper outer quadrant of right breast [N63.11] 09/25/2016  . Depression [F32.9]   . Hypertension [I10]     Past Medical History  Past Medical History:  Diagnosis Date  . Anxiety   . Arthritis   . Depression 2013  . Fibroids, submucosal 10/04/2016  . Headache   . Hep C w/o coma, chronic ([place]) 06/27/2014   had treatment   . Hx of right and left heart catheterization 2014   had cath in [place], no intervention  . Hypertension 1987  . Invasive ductal carcinoma of breast ([place]) 10/14/2016  . Low iron   . Restless legs syndrome     Past Surgical History Past Surgical History:  Procedure Laterality Date  . AXILLARY SENTINEL NODE BIOPSY Right 11/20/2016   Procedure: AXILLARY SENTINEL NODE BIOPSY;  Surgeon: Aviva Signs, MD;  Location: [facility];  Service: General;  Laterality: Right;  Sentinel Node Injection @ 10:30am  . BREAST RECONSTRUCTION WITH PLACEMENT OF TISSUE EXPANDER AND FLEX HD (ACELLULAR HYDRATED DERMIS) Bilateral 12/25/2016   Procedure: BREAST RECONSTRUCTION WITH PLACEMENT OF TISSUE EXPANDER AND FLEX HD (ACELLULAR HYDRATED DERMIS);  Surgeon: Wallace Going, DO;  Location: [place];  Service: Plastics;  Laterality: Bilateral;  . CARDIAC CATHETERIZATION    . DILITATION & CURRETTAGE/HYSTROSCOPY WITH NOVASURE  ABLATION N/A 10/23/2016   Procedure: DILATATION & CURETTAGE/HYSTEROSCOPY WITH NOVASURE ENDOMETRIAL ABLATION;  Surgeon: Florian Buff, MD;  Location: [facility];  Service: Gynecology;  Laterality: N/A;  . MASTECTOMY MODIFIED RADICAL Right 10/23/2016   Procedure: MASTECTOMY MODIFIED RADICAL;  Surgeon: Aviva Signs, MD;  Location: [facility];  Service: General;  Laterality: Right;  . SIMPLE MASTECTOMY WITH AXILLARY SENTINEL NODE BIOPSY Left 10/23/2016   Procedure: SIMPLE MASTECTOMY;  Surgeon: Aviva Signs, MD;  Location: [facility];  Service: General;  Laterality: Left;  . TISSUE EXPANDER PLACEMENT Bilateral 02/27/2017   Procedure: REMOVAL TISSUE EXPANDER;  Surgeon: Wallace Going, DO;  Location: [place];  Service: Plastics;  Laterality: Bilateral;  . TONSILECTOMY, ADENOIDECTOMY, BILATERAL MYRINGOTOMY AND TUBES Bilateral 1984  . TUBAL LIGATION  1998   2000    Family History Family History  Problem Relation Age of Onset  . Hypertension Mother   . Cancer Mother   . Diabetes Father   . Hypertension Father   . Kidney disease Father   . Hyperlipidemia Father   . Asthma Father   . COPD Father   . Heart disease Father   . HIV/AIDS Brother   . Cancer Maternal Grandmother        breast  . Breast cancer Maternal Grandmother   . Hypertension Son      Social History  reports that she has been smoking cigarettes.  She has a 5.00 pack-year smoking history. She has never used smokeless tobacco. She reports that she does not drink alcohol or use drugs.  Medications  Current Outpatient Medications:  .  ALPRAZolam (XANAX) 0.5 MG tablet, TAKE 1 TABLET BY MOUTH TWICE A DAY AS NEEDED FOR ANXIETY, Disp: 30 tablet, Rfl: 0 .  atenolol (TENORMIN) 100 MG tablet, Take 1 tablet (100 mg total) by mouth daily., Disp: 30 tablet, Rfl: 1 .  gabapentin (NEURONTIN) 100 MG capsule, Take 200 mg by mouth at bedtime. If needed when the 300 mg isn't enough for restless leg syndrome, Disp: , Rfl:  .  gabapentin (NEURONTIN) 300  MG capsule, TAKE ONE CAPSULE BY MOUTH 3 TIMES A DAY (Patient taking differently: TAKE ONE CAPSULE BY MOUTH AT BEDTIME), Disp: 90 capsule, Rfl: 2 .  lisinopril (PRINIVIL,ZESTRIL) 10 MG tablet, TAKE 1 TABLET(10 MG) BY MOUTH DAILY, Disp: 30 tablet, Rfl: 11 .  meloxicam (MOBIC) 15  MG tablet, Take 15 mg by mouth daily as needed., Disp: , Rfl: 1 .  methocarbamol (ROBAXIN) 500 MG tablet, Take 1 tablet (500 mg total) by mouth at bedtime as needed for muscle spasms., Disp: 10 tablet, Rfl: 0 .  naproxen (NAPROSYN) 500 MG tablet, Take 1 tablet (500 mg total) by mouth 2 (two) times daily with a meal., Disp: 30 tablet, Rfl: 0 .  oxyCODONE (ROXICODONE) 5 MG immediate release tablet, Take 10 mg by mouth every 4 (four) hours as needed for severe pain., Disp: , Rfl:  .  rOPINIRole (REQUIP) 2 MG tablet, 1-2 tabs po qhs (Patient taking differently: Take 2 mg by mouth at bedtime. ), Disp: 60 tablet, Rfl: 3 .  tamoxifen (NOLVADEX) 20 MG tablet, TAKE 1 TABLET BY MOUTH EVERYDAY AT BEDTIME, Disp: 90 tablet, Rfl: 0 .  venlafaxine XR (EFFEXOR-XR) 75 MG 24 hr capsule, TAKE 1 CAPSULE (75 MG TOTAL) BY MOUTH DAILY WITH BREAKFAST., Disp: 30 capsule, Rfl: 2  Allergies Tramadol  Review of Systems Review of Systems - Oncology ROS negative other than respiratory issues, chest wall pain, diffuse joint pain, weight loss, nausea and diarrhea.     Physical Exam  Vitals Wt Readings from Last 3 Encounters:  12/09/17 226 lb 14.4 oz (102.9 kg)  07/02/17 247 lb (112 kg)  04/01/17 254 lb (115.2 kg)   Temp Readings from Last 3 Encounters:  12/09/17 98 F (36.7 C) (Oral)  07/02/17 97.8 F (36.6 C) (Oral)  04/01/17 97.8 F (36.6 C) (Oral)   BP Readings from Last 3 Encounters:  12/09/17 118/64  07/02/17 (!) 137/96  04/01/17 (!) 152/98   Pulse Readings from Last 3 Encounters:  12/09/17 73  07/02/17 81  04/01/17 76   Constitutional: Well-developed, well-nourished, and in no distress.   HENT: Head: Normocephalic and  atraumatic.  Mouth/Throat: No oropharyngeal exudate. Mucosa moist. Eyes: Pupils are equal, round, and reactive to light. Conjunctivae are normal. No scleral icterus.  Neck: Normal range of motion. Neck supple. No JVD present.  Cardiovascular: Normal rate, regular rhythm and normal heart sounds.  Exam reveals no gallop and no friction rub.   No murmur heard. Pulmonary/Chest: Effort normal and breath sounds normal. No respiratory distress.  Few scattered wheezes.    Abdominal: Soft. Bowel sounds are normal. No distension. There is no tenderness. There is no guarding.  Musculoskeletal: Pt reports diffuse joint pain.  Right chest wall tenderness.   Lymphadenopathy: No cervical, axillary or supraclavicular adenopathy.  Neurological: Alert and oriented to person, place, and time. No cranial nerve deficit.  Skin: Skin is warm and dry. No rash noted. No erythema. No pallor.  Psychiatric: Affect and judgment normal.  Bilateral mastectomy:  Chaperone present.  Chest wall bilaterally has no evidence of palpable nodules.  She reports some tenderness on right chest wall.    Labs No visits with results within 3 Day(s) from this visit.  Latest known visit with results is:  Admission on 03/26/2017, Discharged on 03/26/2017  Component Date Value Ref Range Status  . D-Dimer, Quant 03/26/2017 0.87* 0.00 - 0.50 ug/mL-FEU Final   Comment: (NOTE) At the manufacturer cut-off of 0.50 ug/mL FEU, this assay has been documented to exclude PE with a sensitivity and negative predictive value of 97 to 99%.  At this time, this assay has not been approved by the FDA to exclude DVT/VTE. Results should be correlated with clinical presentation.      Pathology Orders Placed This Encounter  Procedures  . CT CHEST W  CONTRAST    Standing Status:   Future    Standing Expiration Date:   12/09/2018    Order Specific Question:   If indicated for the ordered procedure, I authorize the administration of contrast media per  Radiology protocol    Answer:   Yes    Order Specific Question:   Preferred imaging location?    Answer:   [facility]    Order Specific Question:   Radiology Contrast Protocol - do NOT remove file path    Answer:   [email or web address]  . CT Abdomen Pelvis W Contrast    Standing Status:   Future    Standing Expiration Date:   12/09/2018    Order Specific Question:   If indicated for the ordered procedure, I authorize the administration of contrast media per Radiology protocol    Answer:   Yes    Order Specific Question:   Is patient pregnant?    Answer:   No    Order Specific Question:   Preferred imaging location?    Answer:   [facility]    Order Specific Question:   Is Oral Contrast requested for this exam?    Answer:   Yes, Per Radiology protocol    Order Specific Question:   Radiology Contrast Protocol - do NOT remove file path    Answer:   \\[email or web address]  . NM Bone Scan Whole Body    Standing Status:   Future    Standing Expiration Date:   12/09/2018    Order Specific Question:   If indicated for the ordered procedure, I authorize the administration of a radiopharmaceutical per Radiology protocol    Answer:   Yes    Order Specific Question:   Is the patient pregnant?    Answer:   No    Order Specific Question:   Preferred imaging location?    Answer:   [facility]    Order Specific Question:   Radiology Contrast Protocol - do NOT remove file path    Answer:   [email or web address]       Zoila Shutter MD

## 2017-12-22 ENCOUNTER — Other Ambulatory Visit (HOSPITAL_COMMUNITY): Payer: Self-pay | Admitting: *Deleted

## 2017-12-22 DIAGNOSIS — R232 Flushing: Secondary | ICD-10-CM

## 2017-12-22 DIAGNOSIS — Z170 Estrogen receptor positive status [ER+]: Secondary | ICD-10-CM

## 2017-12-22 DIAGNOSIS — C50411 Malignant neoplasm of upper-outer quadrant of right female breast: Secondary | ICD-10-CM

## 2017-12-22 MED ORDER — VENLAFAXINE HCL ER 75 MG PO CP24: 75.0000 mg | ORAL_CAPSULE | Freq: Every day | ORAL | 2 refills | Status: DC

## 2017-12-24 ENCOUNTER — Encounter (HOSPITAL_COMMUNITY)
Admission: RE | Admit: 2017-12-24 | Discharge: 2017-12-24 | Disposition: A | Discharge status: Home / Self Care | Payer: 59 | Source: Ambulatory Visit | Attending: Internal Medicine | Admitting: Internal Medicine

## 2017-12-24 ENCOUNTER — Ambulatory Visit (HOSPITAL_COMMUNITY)
Admission: RE | Admit: 2017-12-24 | Discharge: 2017-12-24 | Disposition: A | Discharge status: Home / Self Care | Payer: 59 | Source: Ambulatory Visit | Attending: Internal Medicine | Admitting: Internal Medicine

## 2017-12-24 ENCOUNTER — Encounter (HOSPITAL_COMMUNITY): Payer: Self-pay

## 2017-12-24 DIAGNOSIS — M25559 Pain in unspecified hip: Secondary | ICD-10-CM

## 2017-12-24 DIAGNOSIS — C50411 Malignant neoplasm of upper-outer quadrant of right female breast: Secondary | ICD-10-CM

## 2017-12-24 DIAGNOSIS — R0789 Other chest pain: Secondary | ICD-10-CM | POA: Diagnosis not present

## 2017-12-24 DIAGNOSIS — I700 Atherosclerosis of aorta: Secondary | ICD-10-CM | POA: Diagnosis not present

## 2017-12-24 DIAGNOSIS — I2510 Atherosclerotic heart disease of native coronary artery without angina pectoris: Secondary | ICD-10-CM | POA: Insufficient documentation

## 2017-12-24 MED ORDER — IOHEXOL 300 MG/ML  SOLN
75.0000 mL | Freq: Once | INTRAMUSCULAR | Status: AC | PRN
  Administered 2017-12-24: 75 mL via INTRAVENOUS

## 2017-12-24 MED ORDER — TECHNETIUM TC 99M MEDRONATE IV KIT
20.0000 | PACK | Freq: Once | INTRAVENOUS | Status: AC | PRN
  Administered 2017-12-24: 21 via INTRAVENOUS

## 2017-12-26 ENCOUNTER — Other Ambulatory Visit: Payer: Self-pay

## 2017-12-26 ENCOUNTER — Inpatient Hospital Stay (HOSPITAL_COMMUNITY): Payer: 59 | Attending: Internal Medicine | Admitting: Internal Medicine

## 2017-12-26 ENCOUNTER — Encounter (HOSPITAL_COMMUNITY): Payer: Self-pay | Admitting: Internal Medicine

## 2017-12-26 DIAGNOSIS — F419 Anxiety disorder, unspecified: Secondary | ICD-10-CM

## 2017-12-26 DIAGNOSIS — F329 Major depressive disorder, single episode, unspecified: Secondary | ICD-10-CM

## 2017-12-26 DIAGNOSIS — I700 Atherosclerosis of aorta: Secondary | ICD-10-CM | POA: Diagnosis not present

## 2017-12-26 DIAGNOSIS — C50411 Malignant neoplasm of upper-outer quadrant of right female breast: Secondary | ICD-10-CM | POA: Diagnosis present

## 2017-12-26 DIAGNOSIS — Z79810 Long term (current) use of selective estrogen receptor modulators (SERMs): Secondary | ICD-10-CM | POA: Diagnosis not present

## 2017-12-26 DIAGNOSIS — G2581 Restless legs syndrome: Secondary | ICD-10-CM | POA: Diagnosis not present

## 2017-12-26 DIAGNOSIS — M899 Disorder of bone, unspecified: Secondary | ICD-10-CM | POA: Diagnosis not present

## 2017-12-26 DIAGNOSIS — Z79899 Other long term (current) drug therapy: Secondary | ICD-10-CM | POA: Insufficient documentation

## 2017-12-26 DIAGNOSIS — Z9013 Acquired absence of bilateral breasts and nipples: Secondary | ICD-10-CM | POA: Diagnosis not present

## 2017-12-26 DIAGNOSIS — I745 Embolism and thrombosis of iliac artery: Secondary | ICD-10-CM | POA: Insufficient documentation

## 2017-12-26 DIAGNOSIS — I10 Essential (primary) hypertension: Secondary | ICD-10-CM | POA: Insufficient documentation

## 2017-12-26 DIAGNOSIS — K760 Fatty (change of) liver, not elsewhere classified: Secondary | ICD-10-CM | POA: Diagnosis not present

## 2017-12-26 DIAGNOSIS — Z803 Family history of malignant neoplasm of breast: Secondary | ICD-10-CM

## 2017-12-26 DIAGNOSIS — F17210 Nicotine dependence, cigarettes, uncomplicated: Secondary | ICD-10-CM

## 2017-12-26 DIAGNOSIS — Z809 Family history of malignant neoplasm, unspecified: Secondary | ICD-10-CM | POA: Diagnosis not present

## 2017-12-26 DIAGNOSIS — R109 Unspecified abdominal pain: Secondary | ICD-10-CM | POA: Diagnosis not present

## 2017-12-26 DIAGNOSIS — Z7901 Long term (current) use of anticoagulants: Secondary | ICD-10-CM | POA: Diagnosis not present

## 2017-12-26 DIAGNOSIS — R079 Chest pain, unspecified: Secondary | ICD-10-CM | POA: Diagnosis not present

## 2017-12-26 DIAGNOSIS — Z170 Estrogen receptor positive status [ER+]: Secondary | ICD-10-CM | POA: Diagnosis not present

## 2017-12-26 DIAGNOSIS — R937 Abnormal findings on diagnostic imaging of other parts of musculoskeletal system: Secondary | ICD-10-CM

## 2017-12-26 DIAGNOSIS — M2550 Pain in unspecified joint: Secondary | ICD-10-CM | POA: Insufficient documentation

## 2017-12-26 DIAGNOSIS — N920 Excessive and frequent menstruation with regular cycle: Secondary | ICD-10-CM | POA: Diagnosis not present

## 2017-12-26 NOTE — Progress Notes (Signed)
Diagnosis Abnormal findings on diagnostic imaging of other parts of musculoskeletal system - Plan: MR Thoracic Spine W Wo Contrast, MR Lumbar Spine W Wo Contrast, CBC with Differential/Platelet, Comprehensive metabolic panel, Lactate dehydrogenase  Staging Cancer Staging Malignant neoplasm of upper-outer quadrant of right female breast [facility]) Staging form: Breast, AJCC 8th Edition - Pathologic stage from 11/20/2016: Stage IA (pT2(m), pN0, cM0, G1, ER: Positive, PR: Positive, HER2: Negative, Oncotype DX score: 9) - Signed by Holley Bouche, NP on 12/17/2016   Assessment and Plan:  24.  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  Pt was complaining of joint pain and right chest wall pain.  She also has concerns if there is a right chest wall lesion.    Records reviewed and shows she had CT chest without contrast on 12/03/2016 that showed  IMPRESSION: 1. 7.7 cm lobulated fluid attenuation collection in the right axilla, could represent a postoperative fluid collection, with or without presence of infection. Further management will need to be based on clinical grounds. Could correlate with targeted sonography and aspiration as indicated. 2. Clear lung fields 3. Hepatic steatosis  Pt had left breast USN done 07/15/2017 that  was negative.    Patient was set up for a CT chest abdomen pelvis and bone scan for further evaluation.  She reports her pain worsened after her visit,  so she presented to the emergency room at [facility] and underwent a CT of the abdomen and pelvis on 12/17/2017 with findings of 1.  Mild narrowing of SMA secondary to soft plaque, with associated nonocclusive atheromatous filling defect in its proximal portion. 2.  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 3.  No current evidence of acute visceral abnormality relating to vascular compromise.   She is now on Eliquis.    Patient is here to go over  imaging.  CT of the chest done 12/24/2017 showed  IMPRESSION: 1. No findings to suggest recurrent tumor or metastatic disease within the chest. 2. Aortic atherosclerosis and 3 vessel coronary artery atherosclerotic calcifications. Aortic Atherosclerosis (ICD10-I70.0).  Bone scan that was done 12/24/2017 reviewed with patient and showed IMPRESSION: Minimal nonspecific increased tracer localization in lumbar spine at approximately L2-L3 without associated abnormal CT finding; this could be degenerative in origin but metastatic disease is not excluded with this appearance.  Consider MR imaging of the lumbar spine for further assessment.  Due to her ongoing complaints of diffuse joint aches and the findings on bone scan, she will be set up for an MRI thoracic and lumbar spine for further evaluation.  She will be notified of results and additional work-up recommended once scans have been reviewed with radiology.  She is given a January 2020 follow-up pending scan results.  2.  Chest wall fluid collection.  This was noted on scan that was done July 2018.  CT of the chest done July 2019 shows no evidence of fluid.  I discussed with her this was likely a postoperative fluid collection such as a seroma that appears to now have resolved.  3.  L2-3 bone lesion noted on bone scan.  The patient is complaining of joint pain.  I discussed with her that was a nonspecific finding noted in the lumbar spine on recent bone scan.  She will be set up for an MRI of the thoracic and lumbar spine and will be notified of the results and if additional evaluation recommended.  4.  Abdominal pain.  The patient reports this is improved since her evaluation at [facility] when she was found to have evidence of vascular abnormalities.  She is now on Eliquis.  She should continue to follow-up with vascular as recommended.  5  HTN.  BP is 118/73.  Follow-up with PCP.    6.  Smoking.  Cessation is recommended.  CT of the  chest on 12/24/2017 shows known lung abnormalities.  Interval History: Historical data obtained from the note dated 12/09/2017.  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  Pt was previously followed by Dr. Talbert Cage and NP Renato Battles.  She was diagnosed in 09/2016. Patient elected to have bilateral mastectomies with Dr. Arnoldo Morale on 10/23/16, along with D&C/hysteroscopy with ablation (endometrial path benign); she later had right axillary sentinel lymph node biopsy on 11/20/16 and 0/1 LNs.   Oncotype DX score 9 (low risk), indicating no benefit from chemotherapy.  Referred to Dr. Quitman Livings in [place] at [facility] for radiation consideration and he felt radiation was not warranted.  Underwent bilateral breast reconstruction with Dr. Marla Roe on 12/25/16. Serum hormone levels in 11/2016 indicated pre-menopausal status.  Started Tamoxifen in mid-01/2017 and remains on therapy.  Pt had Breast Cancer Index (BCI) testing and results returned  LOW risk of recurrence and LOW likelihood of benefit for anti-estrogen therapy beyond 5 years. Tamoxifen in patient's who have undergone bilateral mastectomies was recommended to reduce risk of distant recurrence.  She has been recommended to takeTamoxifen through 01/2022.   Current Status:  Pt is seen today for follow-up.  She reports abdominal pain has improved since her evaluation at [facility].  She is here to go over CT of the chest and bone scan.      Malignant neoplasm of upper-outer quadrant of right female breast [facility])    Initial Diagnosis    Malignant neoplasm of upper-outer quadrant of right female breast ([place])      10/08/2016 Initial Biopsy    (R) breast needle biopsy (10:00): Invasive ductal carcinoma, grade 1-2, with DCIS.  ER+ (100%), PR+ (100%), Ki67 10%, HER2 neg (ratio 1.03).       10/23/2016 Surgery    Bilateral mastectomies Arnoldo Morale)      10/23/2016 Pathology Results    RIGHT mastectomy: Multifocal IDC, grade 1,  spanning 4.6 cm & 0.6 cm. Low grade DCIS. Negative margins. HER2 repeated and neg (ratio 1.36).  LEFT mastectomy: Fibroadenoma, no malignancy.       11/20/2016 Surgery    (R) axillary sentinel lymph node biopsy Arnoldo Morale)      11/20/2016 Pathology Results    0/1 axillary sentinel LN biopsy for carcinoma.       11/28/2016 Oncotype testing    Recurrence score: 9 (low risk); associated with 6% risk of distant recurrence.       12/03/2016 Imaging    CT chest: IMPRESSION: 1. 7.7 cm lobulated fluid attenuation collection in the right axilla, could represent a postoperative fluid collection, with or without presence of infection. Further management will need to be based on clinical grounds. Could correlate with targeted sonography and aspiration as indicated. 2. Clear lung fields 3. Hepatic steatosis 4. Aortic Atherosclerosis      12/13/2016 Miscellaneous    BCI genomic testing revealed LOW risk of recurrence with 4.3% risk and LOW likelihood of benefit of extension of therapy beyond 5 years.          Problem List Patient Active Problem List   Diagnosis Date Noted  .  Acquired absence of breast and absent nipple, left [Z90.12] 12/25/2016  . S/P right mastectomy [Z90.11] 10/23/2016  . Malignant neoplasm of upper-outer quadrant of right female breast ([place]) [C50.411]   . Family history of breast cancer [Z80.3]   . Fibroids, submucosal [D25.0] 10/04/2016  . Menstrual cramps [N94.6] 09/25/2016  . Menorrhagia with regular cycle [N92.0] 09/25/2016  . Mass of upper outer quadrant of right breast [N63.11] 09/25/2016  . Depression [F32.9]   . Hypertension [I10]     Past Medical History Past Medical History:  Diagnosis Date  . Anxiety   . Arthritis   . Depression 2013  . Fibroids, submucosal 10/04/2016  . Headache   . Hep C w/o coma, chronic ([place]) 06/27/2014   had treatment   . Hx of right and left heart catheterization 2014   had cath in [place], no intervention  . Hypertension  1987  . Invasive ductal carcinoma of breast ([place]) 10/14/2016  . Low iron   . Restless legs syndrome     Past Surgical History Past Surgical History:  Procedure Laterality Date  . AXILLARY SENTINEL NODE BIOPSY Right 11/20/2016   Procedure: AXILLARY SENTINEL NODE BIOPSY;  Surgeon: Aviva Signs, MD;  Location: [facility];  Service: General;  Laterality: Right;  Sentinel Node Injection @ 10:30am  . BREAST RECONSTRUCTION WITH PLACEMENT OF TISSUE EXPANDER AND FLEX HD (ACELLULAR HYDRATED DERMIS) Bilateral 12/25/2016   Procedure: BREAST RECONSTRUCTION WITH PLACEMENT OF TISSUE EXPANDER AND FLEX HD (ACELLULAR HYDRATED DERMIS);  Surgeon: Wallace Going, DO;  Location: [place];  Service: Plastics;  Laterality: Bilateral;  . CARDIAC CATHETERIZATION    . DILITATION & CURRETTAGE/HYSTROSCOPY WITH NOVASURE ABLATION N/A 10/23/2016   Procedure: DILATATION & CURETTAGE/HYSTEROSCOPY WITH NOVASURE ENDOMETRIAL ABLATION;  Surgeon: Florian Buff, MD;  Location: [facility];  Service: Gynecology;  Laterality: N/A;  . MASTECTOMY MODIFIED RADICAL Right 10/23/2016   Procedure: MASTECTOMY MODIFIED RADICAL;  Surgeon: Aviva Signs, MD;  Location: [facility];  Service: General;  Laterality: Right;  . SIMPLE MASTECTOMY WITH AXILLARY SENTINEL NODE BIOPSY Left 10/23/2016   Procedure: SIMPLE MASTECTOMY;  Surgeon: Aviva Signs, MD;  Location: [facility];  Service: General;  Laterality: Left;  . TISSUE EXPANDER PLACEMENT Bilateral 02/27/2017   Procedure: REMOVAL TISSUE EXPANDER;  Surgeon: Wallace Going, DO;  Location: [place];  Service: Plastics;  Laterality: Bilateral;  . TONSILECTOMY, ADENOIDECTOMY, BILATERAL MYRINGOTOMY AND TUBES Bilateral 1984  . TUBAL LIGATION  1998   2000    Family History Family History  Problem Relation Age of Onset  . Hypertension Mother   . Cancer Mother   . Diabetes Father   . Hypertension Father   . Kidney disease Father   . Hyperlipidemia Father   . Asthma Father   . COPD Father    . Heart disease Father   . HIV/AIDS Brother   . Cancer Maternal Grandmother        breast  . Breast cancer Maternal Grandmother   . Hypertension Son      Social History  reports that she has been smoking cigarettes.  She has a 5.00 pack-year smoking history. She has never used smokeless tobacco. She reports that she does not drink alcohol or use drugs.  Medications  Current Outpatient Medications:  .  ALPRAZolam (XANAX) 0.5 MG tablet, TAKE 1 TABLET BY MOUTH TWICE A DAY AS NEEDED FOR ANXIETY, Disp: 30 tablet, Rfl: 0 .  atenolol (TENORMIN) 100 MG tablet, Take 1 tablet (100 mg total) by mouth daily., Disp: 30  tablet, Rfl: 1 .  gabapentin (NEURONTIN) 300 MG capsule, TAKE ONE CAPSULE BY MOUTH 3 TIMES A DAY (Patient taking differently: TAKE ONE CAPSULE BY MOUTH AT BEDTIME), Disp: 90 capsule, Rfl: 2 .  lisinopril (PRINIVIL,ZESTRIL) 10 MG tablet, TAKE 1 TABLET(10 MG) BY MOUTH DAILY, Disp: 30 tablet, Rfl: 11 .  meloxicam (MOBIC) 15 MG tablet, Take 15 mg by mouth daily as needed., Disp: , Rfl: 1 .  methocarbamol (ROBAXIN) 500 MG tablet, Take 1 tablet (500 mg total) by mouth at bedtime as needed for muscle spasms., Disp: 10 tablet, Rfl: 0 .  naproxen (NAPROSYN) 500 MG tablet, Take 1 tablet (500 mg total) by mouth 2 (two) times daily with a meal., Disp: 30 tablet, Rfl: 0 .  oxyCODONE (ROXICODONE) 5 MG immediate release tablet, Take 10 mg by mouth every 4 (four) hours as needed for severe pain., Disp: , Rfl:  .  rOPINIRole (REQUIP) 2 MG tablet, 1-2 tabs po qhs (Patient taking differently: Take 2 mg by mouth at bedtime. ), Disp: 60 tablet, Rfl: 3 .  tamoxifen (NOLVADEX) 20 MG tablet, TAKE 1 TABLET BY MOUTH EVERYDAY AT BEDTIME, Disp: 90 tablet, Rfl: 0 .  venlafaxine XR (EFFEXOR-XR) 75 MG 24 hr capsule, Take 1 capsule (75 mg total) by mouth daily with breakfast., Disp: 30 capsule, Rfl: 2 .  ELIQUIS 5 MG TABS tablet, TAKE 2 TABLETS BY MOUTH TWICE DAILY. START THE EVENING OF 7/24 AND COMPLETE THE MORNING OF  7/30, Disp: , Rfl: 0 .  gabapentin (NEURONTIN) 100 MG capsule, Take 200 mg by mouth at bedtime. If needed when the 300 mg isn't enough for restless leg syndrome, Disp: , Rfl:   Allergies Tramadol  Review of Systems Review of Systems - Oncology ROS negative   Physical Exam  Vitals Wt Readings from Last 3 Encounters:  12/26/17 226 lb (102.5 kg)  12/09/17 226 lb 14.4 oz (102.9 kg)  07/02/17 247 lb (112 kg)   Temp Readings from Last 3 Encounters:  12/26/17 98.6 F (37 C) (Oral)  12/09/17 98 F (36.7 C) (Oral)  07/02/17 97.8 F (36.6 C) (Oral)   BP Readings from Last 3 Encounters:  12/26/17 118/73  12/09/17 118/64  07/02/17 (!) 137/96   Pulse Readings from Last 3 Encounters:  12/26/17 81  12/09/17 73  07/02/17 81   Constitutional: Well-developed, well-nourished, and in no distress.   HENT: Head: Normocephalic and atraumatic.  Mouth/Throat: No oropharyngeal exudate. Mucosa moist. Eyes: Pupils are equal, round, and reactive to light. Conjunctivae are normal. No scleral icterus.  Neck: Normal range of motion. Neck supple. No JVD present.  Cardiovascular: Normal rate, regular rhythm and normal heart sounds.  Exam reveals no gallop and no friction rub.   No murmur heard. Pulmonary/Chest: Effort normal and breath sounds normal. No respiratory distress. No wheezes.No rales.  Abdominal: Soft. Bowel sounds are normal. No distension. There is no tenderness. There is no guarding.  Musculoskeletal: No edema or tenderness.  Lymphadenopathy: No cervical, axillary or supraclavicular adenopathy.  Neurological: Alert and oriented to person, place, and time. No cranial nerve deficit.  Skin: Skin is warm and dry. No rash noted. No erythema. No pallor.  Psychiatric: Affect and judgment normal.  Bilateral breast exam: Chaperone present.  She has evidence of bilateral mastectomy.  No palpable evidence of chest wall recurrence.  No skin nodules noted  Labs No visits with results within 3  Day(s) from this visit.  Latest known visit with results is:  Admission on 03/26/2017, Discharged on 03/26/2017  Component Date Value Ref Range Status  . D-Dimer, Quant 03/26/2017 0.87* 0.00 - 0.50 ug/mL-FEU Final   Comment: (NOTE) At the manufacturer cut-off of 0.50 ug/mL FEU, this assay has been documented to exclude PE with a sensitivity and negative predictive value of 97 to 99%.  At this time, this assay has not been approved by the FDA to exclude DVT/VTE. Results should be correlated with clinical presentation.      Pathology Orders Placed This Encounter  Procedures  . MR Thoracic Spine W Wo Contrast    Standing Status:   Future    Standing Expiration Date:   12/26/2018    Order Specific Question:   GRA to provide read?    Answer:   Yes    Order Specific Question:   If indicated for the ordered procedure, I authorize the administration of contrast media per Radiology protocol    Answer:   Yes    Order Specific Question:   What is the patient's sedation requirement?    Answer:   No Sedation    Order Specific Question:   Use SRS Protocol?    Answer:   No    Order Specific Question:   Does the patient have a pacemaker or implanted devices?    Answer:   No    Order Specific Question:   Preferred imaging location?    Answer:   [facility] (table limit-350lbs)    Order Specific Question:   Radiology Contrast Protocol - do NOT remove file path    Answer:   \\[email or web address]  . MR Lumbar Spine W Wo Contrast    Standing Status:   Future    Standing Expiration Date:   12/26/2018    Order Specific Question:   If indicated for the ordered procedure, I authorize the administration of contrast media per Radiology protocol    Answer:   Yes    Order Specific Question:   What is the patient's sedation requirement?    Answer:   No Sedation    Order Specific Question:   Does the patient have a pacemaker or implanted devices?    Answer:   No    Order  Specific Question:   Use SRS Protocol?    Answer:   No    Order Specific Question:   Radiology Contrast Protocol - do NOT remove file path    Answer:   \\[email or web address]    Order Specific Question:   Preferred imaging location?    Answer:   [facility] (table limit-350lbs)  . CBC with Differential/Platelet    Standing Status:   Future    Standing Expiration Date:   12/27/2019  . Comprehensive metabolic panel    Standing Status:   Future    Standing Expiration Date:   12/27/2019  . Lactate dehydrogenase    Standing Status:   Future    Standing Expiration Date:   12/27/2019       Zoila Shutter MD

## 2017-12-26 NOTE — Patient Instructions (Signed)
[place] at [facility] Discharge Instructions  Seen by Dr. Walden Field today.  MRI of the thoracic and lumbar regions.  Follow up as scheduled.   Thank you for choosing [facility] at [facility] to provide your oncology and hematology care.  To afford each patient quality time with our provider, please arrive at least 15 minutes before your scheduled appointment time.   If you have a lab appointment with the [facility] please come in thru the  Main Entrance and check in at the main information desk  You need to re-schedule your appointment should you arrive 10 or more minutes late.  We strive to give you quality time with our providers, and arriving late affects you and other patients whose appointments are after yours.  Also, if you no show three or more times for appointments you may be dismissed from the clinic at the providers discretion.     Again, thank you for choosing [facility].  Our hope is that these requests will decrease the amount of time that you wait before being seen by our physicians.       _____________________________________________________________  Should you have questions after your visit to [facility], please contact our office at [phone] between the hours of 8:00 a.m. and 4:30 p.m.  Voicemails left after 4:00 p.m. will not be returned until the following business day.  For prescription refill requests, have your pharmacy contact our office and allow 72 hours.    Cancer Center Support Programs:   > Cancer Support Group  2nd Tuesday of the month 1pm-2pm, Journey Room

## 2018-01-02 ENCOUNTER — Other Ambulatory Visit (HOSPITAL_COMMUNITY): Payer: Self-pay | Admitting: Internal Medicine

## 2018-01-05 ENCOUNTER — Ambulatory Visit (HOSPITAL_COMMUNITY)
Admission: RE | Admit: 2018-01-05 | Discharge: 2018-01-05 | Disposition: A | Discharge status: Home / Self Care | Payer: 59 | Source: Ambulatory Visit | Attending: Internal Medicine | Admitting: Internal Medicine

## 2018-01-05 DIAGNOSIS — R937 Abnormal findings on diagnostic imaging of other parts of musculoskeletal system: Secondary | ICD-10-CM | POA: Diagnosis present

## 2018-01-05 DIAGNOSIS — M5126 Other intervertebral disc displacement, lumbar region: Secondary | ICD-10-CM | POA: Diagnosis not present

## 2018-01-05 DIAGNOSIS — M1288 Other specific arthropathies, not elsewhere classified, other specified site: Secondary | ICD-10-CM | POA: Insufficient documentation

## 2018-01-05 DIAGNOSIS — Q057 Lumbar spina bifida without hydrocephalus: Secondary | ICD-10-CM | POA: Insufficient documentation

## 2018-01-05 LAB — POCT I-STAT CREATININE: CREATININE: 0.8 mg/dL (ref 0.44–1.00)

## 2018-01-05 MED ORDER — GADOBENATE DIMEGLUMINE 529 MG/ML IV SOLN
20.0000 mL | Freq: Once | INTRAVENOUS | Status: AC | PRN
  Administered 2018-01-05: 20 mL via INTRAVENOUS

## 2018-01-06 ENCOUNTER — Telehealth (HOSPITAL_COMMUNITY): Payer: Self-pay

## 2018-01-06 NOTE — Telephone Encounter (Signed)
Patient called requesting results from her recent MRI. Reviewed with Dr. Walden Field. Notified patient her MRI was negative for bone lesions but she does have a bulging disc. Instructed patient to follow up with her PCP or orthopedic MD. Patient verbalized understanding.

## 2018-01-08 ENCOUNTER — Other Ambulatory Visit (HOSPITAL_COMMUNITY): Payer: Self-pay | Admitting: Hematology

## 2018-01-08 DIAGNOSIS — Z170 Estrogen receptor positive status [ER+]: Secondary | ICD-10-CM

## 2018-01-08 DIAGNOSIS — C50411 Malignant neoplasm of upper-outer quadrant of right female breast: Secondary | ICD-10-CM

## 2018-02-17 ENCOUNTER — Encounter (HOSPITAL_COMMUNITY): Payer: Self-pay | Admitting: *Deleted

## 2018-02-17 NOTE — Progress Notes (Signed)
Patient called clinic with concerns of continuing tamoxifen.  She states that she has had "just about every single side effect".  She states that she isn't sure she wants to continue with tamoxifen. She wants to come in to discuss continuing the medication versus stopping the medication and the chances of her cancer reoccurring.    Appointments have been made for 10/14.

## 2018-02-18 ENCOUNTER — Ambulatory Visit (HOSPITAL_COMMUNITY): Payer: 59 | Admitting: Internal Medicine

## 2018-02-19 ENCOUNTER — Other Ambulatory Visit (HOSPITAL_COMMUNITY): Payer: Self-pay | Admitting: *Deleted

## 2018-02-19 DIAGNOSIS — Z170 Estrogen receptor positive status [ER+]: Secondary | ICD-10-CM

## 2018-02-19 DIAGNOSIS — C50411 Malignant neoplasm of upper-outer quadrant of right female breast: Secondary | ICD-10-CM

## 2018-02-19 MED ORDER — TAMOXIFEN CITRATE 20 MG PO TABS: ORAL_TABLET | ORAL | 0 refills | Status: DC

## 2018-03-09 ENCOUNTER — Inpatient Hospital Stay (HOSPITAL_COMMUNITY): Payer: 59 | Attending: Internal Medicine | Admitting: Internal Medicine

## 2018-04-02 ENCOUNTER — Other Ambulatory Visit: Payer: BLUE CROSS/BLUE SHIELD | Admitting: Adult Health

## 2018-10-23 ENCOUNTER — Other Ambulatory Visit (HOSPITAL_COMMUNITY): Payer: Self-pay | Admitting: *Deleted

## 2018-10-23 DIAGNOSIS — C50411 Malignant neoplasm of upper-outer quadrant of right female breast: Secondary | ICD-10-CM

## 2018-10-23 DIAGNOSIS — C50911 Malignant neoplasm of unspecified site of right female breast: Secondary | ICD-10-CM

## 2018-10-26 ENCOUNTER — Other Ambulatory Visit (HOSPITAL_COMMUNITY): Payer: 59

## 2018-11-02 ENCOUNTER — Ambulatory Visit (HOSPITAL_COMMUNITY): Payer: 59 | Admitting: Hematology

## 2018-11-11 ENCOUNTER — Other Ambulatory Visit (HOSPITAL_COMMUNITY): Payer: Self-pay | Admitting: *Deleted

## 2018-11-11 ENCOUNTER — Other Ambulatory Visit: Payer: Self-pay

## 2018-11-11 ENCOUNTER — Inpatient Hospital Stay (HOSPITAL_COMMUNITY): Payer: Managed Care, Other (non HMO) | Attending: Hematology

## 2018-11-11 DIAGNOSIS — C50411 Malignant neoplasm of upper-outer quadrant of right female breast: Secondary | ICD-10-CM

## 2018-11-11 DIAGNOSIS — R630 Anorexia: Secondary | ICD-10-CM | POA: Insufficient documentation

## 2018-11-11 DIAGNOSIS — I745 Embolism and thrombosis of iliac artery: Secondary | ICD-10-CM | POA: Diagnosis not present

## 2018-11-11 DIAGNOSIS — K760 Fatty (change of) liver, not elsewhere classified: Secondary | ICD-10-CM | POA: Insufficient documentation

## 2018-11-11 DIAGNOSIS — R109 Unspecified abdominal pain: Secondary | ICD-10-CM | POA: Insufficient documentation

## 2018-11-11 DIAGNOSIS — I700 Atherosclerosis of aorta: Secondary | ICD-10-CM | POA: Diagnosis not present

## 2018-11-11 DIAGNOSIS — M129 Arthropathy, unspecified: Secondary | ICD-10-CM | POA: Diagnosis not present

## 2018-11-11 DIAGNOSIS — F17210 Nicotine dependence, cigarettes, uncomplicated: Secondary | ICD-10-CM | POA: Diagnosis not present

## 2018-11-11 DIAGNOSIS — G2581 Restless legs syndrome: Secondary | ICD-10-CM | POA: Diagnosis not present

## 2018-11-11 DIAGNOSIS — Z803 Family history of malignant neoplasm of breast: Secondary | ICD-10-CM | POA: Insufficient documentation

## 2018-11-11 DIAGNOSIS — Z809 Family history of malignant neoplasm, unspecified: Secondary | ICD-10-CM | POA: Insufficient documentation

## 2018-11-11 DIAGNOSIS — Z79810 Long term (current) use of selective estrogen receptor modulators (SERMs): Secondary | ICD-10-CM | POA: Diagnosis not present

## 2018-11-11 DIAGNOSIS — Z79899 Other long term (current) drug therapy: Secondary | ICD-10-CM | POA: Diagnosis not present

## 2018-11-11 DIAGNOSIS — Z170 Estrogen receptor positive status [ER+]: Secondary | ICD-10-CM | POA: Insufficient documentation

## 2018-11-11 DIAGNOSIS — R232 Flushing: Secondary | ICD-10-CM | POA: Diagnosis not present

## 2018-11-11 DIAGNOSIS — I10 Essential (primary) hypertension: Secondary | ICD-10-CM | POA: Diagnosis not present

## 2018-11-11 DIAGNOSIS — B182 Chronic viral hepatitis C: Secondary | ICD-10-CM | POA: Diagnosis not present

## 2018-11-11 DIAGNOSIS — Z9013 Acquired absence of bilateral breasts and nipples: Secondary | ICD-10-CM | POA: Insufficient documentation

## 2018-11-11 DIAGNOSIS — R634 Abnormal weight loss: Secondary | ICD-10-CM | POA: Insufficient documentation

## 2018-11-11 DIAGNOSIS — F329 Major depressive disorder, single episode, unspecified: Secondary | ICD-10-CM | POA: Insufficient documentation

## 2018-11-11 LAB — COMPREHENSIVE METABOLIC PANEL
ALT: 22 U/L (ref 0–44)
AST: 22 U/L (ref 15–41)
Albumin: 3.9 g/dL (ref 3.5–5.0)
Alkaline Phosphatase: 61 U/L (ref 38–126)
Anion gap: 11 (ref 5–15)
BUN: 14 mg/dL (ref 6–20)
CO2: 26 mmol/L (ref 22–32)
Calcium: 9.3 mg/dL (ref 8.9–10.3)
Chloride: 102 mmol/L (ref 98–111)
Creatinine, Ser: 0.52 mg/dL (ref 0.44–1.00)
GFR calc Af Amer: >60 mL/min (ref >60)
GFR calc non Af Amer: >60 mL/min (ref >60)
Glucose, Bld: 102 mg/dL — ABNORMAL HIGH (ref 70–99)
Potassium: 4.9 mmol/L (ref 3.5–5.1)
Sodium: 139 mmol/L (ref 135–145)
Total Bilirubin: 0.3 mg/dL (ref 0.3–1.2)
Total Protein: 7.5 g/dL (ref 6.5–8.1)

## 2018-11-11 LAB — CBC WITH DIFFERENTIAL/PLATELET
Abs Immature Granulocytes: 0.02 10*3/uL (ref 0.00–0.07)
Basophils Absolute: 0.1 10*3/uL (ref 0.0–0.1)
Basophils Relative: 1 %
Eosinophils Absolute: 0.5 10*3/uL (ref 0.0–0.5)
Eosinophils Relative: 5 %
HCT: 44.1 % (ref 36.0–46.0)
Hemoglobin: 14.4 g/dL (ref 12.0–15.0)
Immature Granulocytes: 0 %
Lymphocytes Relative: 25 %
Lymphs Abs: 2.3 10*3/uL (ref 0.7–4.0)
MCH: 27.9 pg (ref 26.0–34.0)
MCHC: 32.7 g/dL (ref 30.0–36.0)
MCV: 85.3 fL (ref 80.0–100.0)
Monocytes Absolute: 0.6 10*3/uL (ref 0.1–1.0)
Monocytes Relative: 7 %
Neutro Abs: 5.7 10*3/uL (ref 1.7–7.7)
Neutrophils Relative %: 62 %
Platelets: 279 10*3/uL (ref 150–400)
RBC: 5.17 MIL/uL — ABNORMAL HIGH (ref 3.87–5.11)
RDW: 13 % (ref 11.5–15.5)
WBC: 9.1 10*3/uL (ref 4.0–10.5)
nRBC: 0 % (ref 0.0–0.2)

## 2018-11-11 LAB — LACTATE DEHYDROGENASE: LDH: 141 U/L (ref 98–192)

## 2018-11-12 ENCOUNTER — Encounter (HOSPITAL_COMMUNITY): Payer: Self-pay | Admitting: Hematology

## 2018-11-12 ENCOUNTER — Inpatient Hospital Stay (HOSPITAL_BASED_OUTPATIENT_CLINIC_OR_DEPARTMENT_OTHER): Payer: Managed Care, Other (non HMO) | Admitting: Hematology

## 2018-11-12 VITALS — BP 148/72 | HR 67 | Temp 98.2°F | Resp 18 | Wt 216.4 lb

## 2018-11-12 DIAGNOSIS — F329 Major depressive disorder, single episode, unspecified: Secondary | ICD-10-CM

## 2018-11-12 DIAGNOSIS — I82A21 Chronic embolism and thrombosis of right axillary vein: Secondary | ICD-10-CM

## 2018-11-12 DIAGNOSIS — Z9013 Acquired absence of bilateral breasts and nipples: Secondary | ICD-10-CM

## 2018-11-12 DIAGNOSIS — C50411 Malignant neoplasm of upper-outer quadrant of right female breast: Secondary | ICD-10-CM | POA: Diagnosis not present

## 2018-11-12 DIAGNOSIS — I10 Essential (primary) hypertension: Secondary | ICD-10-CM

## 2018-11-12 DIAGNOSIS — R232 Flushing: Secondary | ICD-10-CM | POA: Diagnosis not present

## 2018-11-12 DIAGNOSIS — Z79810 Long term (current) use of selective estrogen receptor modulators (SERMs): Secondary | ICD-10-CM

## 2018-11-12 DIAGNOSIS — F17210 Nicotine dependence, cigarettes, uncomplicated: Secondary | ICD-10-CM

## 2018-11-12 DIAGNOSIS — R630 Anorexia: Secondary | ICD-10-CM

## 2018-11-12 DIAGNOSIS — Z809 Family history of malignant neoplasm, unspecified: Secondary | ICD-10-CM

## 2018-11-12 DIAGNOSIS — B182 Chronic viral hepatitis C: Secondary | ICD-10-CM

## 2018-11-12 DIAGNOSIS — Z170 Estrogen receptor positive status [ER+]: Secondary | ICD-10-CM | POA: Diagnosis not present

## 2018-11-12 DIAGNOSIS — Z803 Family history of malignant neoplasm of breast: Secondary | ICD-10-CM

## 2018-11-12 DIAGNOSIS — Z79899 Other long term (current) drug therapy: Secondary | ICD-10-CM

## 2018-11-12 DIAGNOSIS — G2581 Restless legs syndrome: Secondary | ICD-10-CM

## 2018-11-12 DIAGNOSIS — I745 Embolism and thrombosis of iliac artery: Secondary | ICD-10-CM

## 2018-11-12 DIAGNOSIS — I700 Atherosclerosis of aorta: Secondary | ICD-10-CM

## 2018-11-12 DIAGNOSIS — R109 Unspecified abdominal pain: Secondary | ICD-10-CM

## 2018-11-12 DIAGNOSIS — K760 Fatty (change of) liver, not elsewhere classified: Secondary | ICD-10-CM

## 2018-11-12 DIAGNOSIS — M129 Arthropathy, unspecified: Secondary | ICD-10-CM

## 2018-11-12 MED ORDER — VENLAFAXINE HCL ER 150 MG PO CP24: 75.0000 mg | ORAL_CAPSULE | Freq: Every day | ORAL | 6 refills | Status: DC

## 2018-11-12 MED ORDER — ELIQUIS 5 MG PO TABS
5.0000 mg | ORAL_TABLET | Freq: Two times a day (BID) | ORAL | 3 refills | Status: DC
Start: 2018-11-12 — End: 2021-04-23

## 2018-11-12 NOTE — Patient Instructions (Signed)
[facility] at [facility] Discharge Instructions  You were seen today by Dr. Katragadda. He went over your recent lab results. He will see you back in 6 months for labs and follow up.   Thank you for choosing  [facility] at [facility] to provide your oncology and hematology care.  To afford each patient quality time with our provider, please arrive at least 15 minutes before your scheduled appointment time.   If you have a lab appointment with the Cancer Center please come in thru the  Main Entrance and check in at the main information desk  You need to re-schedule your appointment should you arrive 10 or more minutes late.  We strive to give you quality time with our providers, and arriving late affects you and other patients whose appointments are after yours.  Also, if you no show three or more times for appointments you may be dismissed from the clinic at the providers discretion.     Again, thank you for choosing [facility].  Our hope is that these requests will decrease the amount of time that you wait before being seen by our physicians.       _____________________________________________________________  Should you have questions after your visit to [facility], please contact our office at [phone] between the hours of 8:00 a.m. and 4:30 p.m.  Voicemails left after 4:00 p.m. will not be returned until the following business day.  For prescription refill requests, have your pharmacy contact our office and allow 72 hours.    Cancer Center Support Programs:   > Cancer Support Group  2nd Tuesday of the month 1pm-2pm, Journey Room    

## 2018-11-12 NOTE — Assessment & Plan Note (Addendum)
1.  Stage Ia right breast IDC, ER/PR positive, HER-2 negative: - Right mastectomy and left simple mastectomy on 10/08/2016, PT2NX, Ki-67 10%, multifocal, 4.6 cm, 0.6 cm, grade 1, margins negative. - Oncotype DX score was 9. - She was started on tamoxifen.  She reports body pains and hot flashes. - Physical examination today did not reveal any palpable masses at the mastectomy sites. - She would like to have reconstruction done.  We will make a referral to Dr. Crissie Reese, plastic surgeon at [facility].  She had tried immediate reconstruction with Dr. Marla Roe, unfortunately had infected expanders. - We will see her back in 6 months as well.  2.  Hot flashes: -She is having a lot of hot flashes day and night. -She is currently on venlafaxine 75 mg daily.  She is also on gabapentin 300 mg at bedtime. - Hot flashes are clearly not well controlled.  She requests to increase the dose of venlafaxine.  I have told her that she might not have any additional benefit from increasing to higher dose.  However we will increase it to 150 mg and see if it helps.  We talked about side effects in detail.  3.  Postprandial abdominal pain: -She had spontaneous SMA dissection and thrombus.  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 -She is on Plavix.  She also remains on Eliquis.  It is unclear if tamoxifen has any contribution to it. - She reports recent loss of about 20 pounds.  She has abdominal pain after eating.  I have told her to contact her vascular surgeon.

## 2018-11-12 NOTE — Progress Notes (Signed)
Susan Benton, [place] [address]   CLINIC:  Medical Oncology/Hematology  PCP:  Celene Squibb, MD [place] [address] [phone]   REASON FOR VISIT:  Follow-up for right breast cancer.   BRIEF ONCOLOGIC HISTORY:  Oncology History  Malignant neoplasm of upper-outer quadrant of right female breast [facility])   Initial Diagnosis   Malignant neoplasm of upper-outer quadrant of right female breast ([place])   10/08/2016 Initial Biopsy   (R) breast needle biopsy (10:00): Invasive ductal carcinoma, grade 1-2, with DCIS.  ER+ (100%), PR+ (100%), Ki67 10%, HER2 neg (ratio 1.03).    10/23/2016 Surgery   Bilateral mastectomies [facility] Morale)   10/23/2016 Pathology Results   RIGHT mastectomy: Multifocal IDC, grade 1, spanning 4.6 cm & 0.6 cm. Low grade DCIS. Negative margins. HER2 repeated and neg (ratio 1.36).  LEFT mastectomy: Fibroadenoma, no malignancy.    11/20/2016 Surgery   (R) axillary sentinel lymph node biopsy [facility] Morale)   11/20/2016 Pathology Results   0/1 axillary sentinel LN biopsy for carcinoma.    11/28/2016 Oncotype testing   Recurrence score: 9 (low risk); associated with 6% risk of distant recurrence.    12/03/2016 Imaging   CT chest: IMPRESSION: 1. 7.7 cm lobulated fluid attenuation collection in the right axilla, could represent a postoperative fluid collection, with or without presence of infection. Further management will need to be based on clinical grounds. Could correlate with targeted sonography and aspiration as indicated. 2. Clear lung fields 3. Hepatic steatosis 4. Aortic Atherosclerosis   12/13/2016 Miscellaneous   BCI genomic testing revealed LOW risk of recurrence with 4.3% risk and LOW likelihood of benefit of extension of therapy beyond 5 years.        CANCER STAGING: Cancer Staging Malignant neoplasm of upper-outer quadrant of right female breast [facility]) Staging form: Breast, AJCC 8th Edition - Pathologic  stage from 11/20/2016: Stage IA (pT2(m), pN0, cM0, G1, ER: Positive, PR: Positive, HER2: Negative, Oncotype DX score: 9) - Signed by Holley Bouche, NP on 12/17/2016    INTERVAL HISTORY:  Susan Benton 48 y.o. female seen for follow-up of right breast cancer.  She is taking tamoxifen.  However she is having a lot of hot flashes.  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  Hot flashes are more during daytime.  She also has hot flashes at nighttime.  She is currently on venlafaxine 75 mg and gabapentin 300 mg at bedtime.  She also reports abdominal pain after eating.  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  She is already on Eliquis.  She is also on Plavix.  Denies any bleeding issues.  She would like to seek consultation with a plastic surgeon.  She reportedly used to work 80 hours but is only working 40 to 50 hours at this time.  She reports decreasing energy.  Appetite is 75%.    REVIEW OF SYSTEMS:  Review of Systems  All other systems reviewed and are negative.    PAST MEDICAL/SURGICAL HISTORY:  Past Medical History:  Diagnosis Date  . Anxiety   . Arthritis   . Depression 2013  . Fibroids, submucosal 10/04/2016  . Headache   . Hep C w/o coma, chronic ([place]) 06/27/2014   had treatment   . Hx of right and left heart catheterization 2014   had cath in [place], no intervention  . Hypertension 1987  . Invasive ductal carcinoma  of breast ([place]) 10/14/2016  . Low iron   . Restless legs syndrome    Past Surgical History:  Procedure Laterality Date  . AXILLARY SENTINEL NODE BIOPSY Right 11/20/2016   Procedure: AXILLARY SENTINEL NODE BIOPSY;  Surgeon: Aviva Signs, MD;  Location: [facility];  Service: General;  Laterality: Right;  Sentinel Node Injection @ 10:30am  . BREAST RECONSTRUCTION WITH PLACEMENT OF TISSUE EXPANDER AND FLEX HD (ACELLULAR HYDRATED DERMIS) Bilateral 12/25/2016   Procedure: BREAST RECONSTRUCTION WITH PLACEMENT OF  TISSUE EXPANDER AND FLEX HD (ACELLULAR HYDRATED DERMIS);  Surgeon: Wallace Going, DO;  Location: [place];  Service: Plastics;  Laterality: Bilateral;  . CARDIAC CATHETERIZATION    . DILITATION & CURRETTAGE/HYSTROSCOPY WITH NOVASURE ABLATION N/A 10/23/2016   Procedure: DILATATION & CURETTAGE/HYSTEROSCOPY WITH NOVASURE ENDOMETRIAL ABLATION;  Surgeon: Florian Buff, MD;  Location: [facility];  Service: Gynecology;  Laterality: N/A;  . MASTECTOMY MODIFIED RADICAL Right 10/23/2016   Procedure: MASTECTOMY MODIFIED RADICAL;  Surgeon: Aviva Signs, MD;  Location: [facility];  Service: General;  Laterality: Right;  . SIMPLE MASTECTOMY WITH AXILLARY SENTINEL NODE BIOPSY Left 10/23/2016   Procedure: SIMPLE MASTECTOMY;  Surgeon: Aviva Signs, MD;  Location: [facility];  Service: General;  Laterality: Left;  . TISSUE EXPANDER PLACEMENT Bilateral 02/27/2017   Procedure: REMOVAL TISSUE EXPANDER;  Surgeon: Wallace Going, DO;  Location: [place];  Service: Plastics;  Laterality: Bilateral;  . TONSILECTOMY, ADENOIDECTOMY, BILATERAL MYRINGOTOMY AND TUBES Bilateral 1984  . TUBAL LIGATION  1998   2000     SOCIAL HISTORY:  Social History   Socioeconomic History  . Marital status: Married    Spouse name: Not on file  . Number of children: Not on file  . Years of education: Not on file  . Highest education level: Not on file  Occupational History  . Not on file  Social Needs  . Financial resource strain: Not on file  . Food insecurity    Worry: Not on file    Inability: Not on file  . Transportation needs    Medical: Not on file    Non-medical: Not on file  Tobacco Use  . Smoking status: Current Every Day Smoker    Packs/day: 0.25    Years: 20.00    Pack years: 5.00    Types: Cigarettes    Last attempt to quit: 07/20/2016    Years since quitting: 2.3  . Smokeless tobacco: Never Used  . Tobacco comment: 3-4 per day  Substance and Sexual Activity  . Alcohol use: No  . Drug use: No   . Sexual activity: Yes    Birth control/protection: Surgical    Comment: tubal  Lifestyle  . Physical activity    Days per week: Not on file    Minutes per session: Not on file  . Stress: Not on file  Relationships  . Social Herbalist on phone: Not on file    Gets together: Not on file    Attends religious service: Not on file    Active member of club or organization: Not on file    Attends meetings of clubs or organizations: Not on file    Relationship status: Not on file  . Intimate partner violence    Fear of current or ex partner: Not on file    Emotionally abused: Not on file    Physically abused: Not on file    Forced sexual activity: Not on file  Other Topics Concern  .  Not on file  Social History Narrative  . Not on file    FAMILY HISTORY:  Family History  Problem Relation Age of Onset  . Hypertension Mother   . Cancer Mother   . Diabetes Father   . Hypertension Father   . Kidney disease Father   . Hyperlipidemia Father   . Asthma Father   . COPD Father   . Heart disease Father   . HIV/AIDS Brother   . Cancer Maternal Grandmother        breast  . Breast cancer Maternal Grandmother   . Hypertension Son     CURRENT MEDICATIONS:  Outpatient Encounter Medications as of 11/12/2018  Medication Sig  . ALPRAZolam (XANAX) 0.5 MG tablet TAKE 1 TABLET BY MOUTH TWICE A DAY AS NEEDED FOR ANXIETY  . atenolol (TENORMIN) 100 MG tablet Take 1 tablet (100 mg total) by mouth daily.  Marland Kitchen ELIQUIS 5 MG TABS tablet Take 1 tablet (5 mg total) by mouth 2 (two) times daily.  Marland Kitchen gabapentin (NEURONTIN) 300 MG capsule TAKE ONE CAPSULE BY MOUTH 3 TIMES A DAY (Patient taking differently: TAKE ONE CAPSULE BY MOUTH AT BEDTIME)  . lisinopril (PRINIVIL,ZESTRIL) 10 MG tablet TAKE 1 TABLET(10 MG) BY MOUTH DAILY  . oxyCODONE (ROXICODONE) 5 MG immediate release tablet Take 10 mg by mouth every 4 (four) hours as needed for severe pain.  . tamoxifen (NOLVADEX) 20 MG tablet TAKE 1  TABLET BY MOUTH EVERYDAY AT BEDTIME  . venlafaxine XR (EFFEXOR-XR) 150 MG 24 hr capsule Take 1 capsule (150 mg total) by mouth daily with breakfast.  . [DISCONTINUED] ELIQUIS 5 MG TABS tablet TAKE 2 TABLETS BY MOUTH TWICE DAILY. START THE EVENING OF 7/24 AND COMPLETE THE MORNING OF 7/30  . [DISCONTINUED] venlafaxine XR (EFFEXOR-XR) 75 MG 24 hr capsule Take 1 capsule (75 mg total) by mouth daily with breakfast.  . meloxicam (MOBIC) 15 MG tablet Take 15 mg by mouth daily as needed.  . methocarbamol (ROBAXIN) 500 MG tablet Take 1 tablet (500 mg total) by mouth at bedtime as needed for muscle spasms. (Patient not taking: Reported on 11/12/2018)  . naproxen (NAPROSYN) 500 MG tablet Take 1 tablet (500 mg total) by mouth 2 (two) times daily with a meal. (Patient not taking: Reported on 11/12/2018)  . rOPINIRole (REQUIP) 2 MG tablet 1-2 tabs po qhs (Patient not taking: Reported on 11/12/2018)  . [DISCONTINUED] gabapentin (NEURONTIN) 100 MG capsule Take 200 mg by mouth at bedtime. If needed when the 300 mg isn't enough for restless leg syndrome  . [DISCONTINUED] tamoxifen (NOLVADEX) 10 MG tablet Take 10 mg by mouth daily.   No facility-administered encounter medications on file as of 11/12/2018.     ALLERGIES:  Allergies  Allergen Reactions  . Tramadol Other (See Comments)    Migraine headaches      PHYSICAL EXAM:  ECOG Performance status: 0  Vitals:   11/12/18 0959  BP: (!) 148/72  Pulse: 67  Resp: 18  Temp: 98.2 F (36.8 C)   Filed Weights   11/12/18 0959  Weight: 216 lb 6.4 oz (98.2 kg)    Physical Exam Vitals signs reviewed.  Constitutional:      Appearance: Normal appearance.  Cardiovascular:     Rate and Rhythm: Normal rate and regular rhythm.     Heart sounds: Normal heart sounds.  Pulmonary:     Effort: Pulmonary effort is normal.     Breath sounds: Normal breath sounds.  Abdominal:     General: There  is no distension.     Palpations: Abdomen is soft. There is no mass.   Skin:    General: Skin is warm.  Neurological:     General: No focal deficit present.     Mental Status: She is alert and oriented to person, place, and time.  Psychiatric:        Mood and Affect: Mood normal.        Behavior: Behavior normal.    Bilateral mastectomy sites are within normal limits with no suspicious nodules.  No suspicious adenopathy.  LABORATORY DATA:  I have reviewed the labs as listed.  CBC    Component Value Date/Time   WBC 9.1 11/11/2018 1053   RBC 5.17 (H) 11/11/2018 1053   HGB 14.4 11/11/2018 1053   HCT 44.1 11/11/2018 1053   PLT 279 11/11/2018 1053   MCV 85.3 11/11/2018 1053   MCH 27.9 11/11/2018 1053   MCHC 32.7 11/11/2018 1053   RDW 13.0 11/11/2018 1053   LYMPHSABS 2.3 11/11/2018 1053   MONOABS 0.6 11/11/2018 1053   EOSABS 0.5 11/11/2018 1053   BASOSABS 0.1 11/11/2018 1053   CMP Latest Ref Rng & Units 11/11/2018 01/05/2018 02/27/2017  Glucose 70 - 99 mg/dL 102(H) - 91  BUN 6 - 20 mg/dL 14 - 15  Creatinine 0.44 - 1.00 mg/dL 0.52 0.80 0.58  Sodium 135 - 145 mmol/L 139 - 134(L)  Potassium 3.5 - 5.1 mmol/L 4.9 - 4.3  Chloride 98 - 111 mmol/L 102 - 103  CO2 22 - 32 mmol/L 26 - 20(L)  Calcium 8.9 - 10.3 mg/dL 9.3 - 8.6(L)  Total Protein 6.5 - 8.1 g/dL 7.5 - -  Total Bilirubin 0.3 - 1.2 mg/dL 0.3 - -  Alkaline Phos 38 - 126 U/L 61 - -  AST 15 - 41 U/L 22 - -  ALT 0 - 44 U/L 22 - -       DIAGNOSTIC IMAGING:  I have independently reviewed the scans and discussed with the patient.   I have reviewed Venita Lick LPN's note and agree with the documentation.  I personally performed a face-to-face visit, made revisions and my assessment and plan is as follows.    ASSESSMENT & PLAN:   Malignant neoplasm of upper-outer quadrant of right female breast ([place]) 1.  Stage Ia right breast IDC, ER/PR positive, HER-2 negative: - Right mastectomy and left simple mastectomy on 10/08/2016, PT2NX, Ki-67 10%, multifocal, 4.6 cm, 0.6 cm, grade 1, margins  negative. - Oncotype DX score was 9. - She was started on tamoxifen.  She reports body pains and hot flashes. - Physical examination today did not reveal any palpable masses at the mastectomy sites. - She would like to have reconstruction done.  We will make a referral to Dr. Crissie Reese, plastic surgeon at [facility].  She had tried immediate reconstruction with Dr. Marla Roe, unfortunately had infected expanders. - We will see her back in 6 months as well.  2.  Hot flashes: -She is having a lot of hot flashes day and night. -She is currently on venlafaxine 75 mg daily.  She is also on gabapentin 300 mg at bedtime. - Hot flashes are clearly not well controlled.  She requests to increase the dose of venlafaxine.  I have told her that she might not have any additional benefit from increasing to higher dose.  However we will increase it to 150 mg and see if it helps.  We talked about side effects in detail.  3.  Postprandial abdominal pain: -She had spontaneous SMA dissection and thrombus.  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 -She is on Plavix.  She also remains on Eliquis.  It is unclear if tamoxifen has any contribution to it. - She reports recent loss of about 20 pounds.  She has abdominal pain after eating.  I have told her to contact her vascular surgeon.  Total time spent is 25 minutes more than 50% of the time spent face-to-face discussing treatment plan and coordination of care.    Orders placed this encounter:  No orders of the defined types were placed in this encounter.     Derek Jack, MD [place] [phone]

## 2018-11-13 ENCOUNTER — Encounter (HOSPITAL_COMMUNITY): Payer: Self-pay | Admitting: Lab

## 2018-11-13 NOTE — Progress Notes (Unsigned)
Referral sent to Dr Crissie Reese [facility]. Records faxed to [phone]. They will contact pt with appt

## 2018-12-14 ENCOUNTER — Other Ambulatory Visit (HOSPITAL_COMMUNITY): Payer: Self-pay | Admitting: Hematology

## 2018-12-14 DIAGNOSIS — R232 Flushing: Secondary | ICD-10-CM

## 2018-12-14 DIAGNOSIS — Z170 Estrogen receptor positive status [ER+]: Secondary | ICD-10-CM

## 2018-12-14 DIAGNOSIS — C50411 Malignant neoplasm of upper-outer quadrant of right female breast: Secondary | ICD-10-CM

## 2019-01-01 ENCOUNTER — Other Ambulatory Visit (HOSPITAL_COMMUNITY): Payer: 59

## 2019-01-08 ENCOUNTER — Ambulatory Visit (HOSPITAL_COMMUNITY): Payer: 59 | Admitting: Hematology

## 2019-08-24 ENCOUNTER — Other Ambulatory Visit: Payer: Self-pay

## 2019-08-24 ENCOUNTER — Ambulatory Visit: Payer: Managed Care, Other (non HMO) | Attending: Internal Medicine

## 2019-08-24 DIAGNOSIS — Z20822 Contact with and (suspected) exposure to covid-19: Secondary | ICD-10-CM

## 2019-08-25 ENCOUNTER — Telehealth: Payer: Self-pay | Admitting: Internal Medicine

## 2019-08-25 LAB — NOVEL CORONAVIRUS, NAA: SARS-CoV-2, NAA: NOT DETECTED

## 2019-08-25 LAB — SARS-COV-2, NAA 2 DAY TAT

## 2019-08-25 NOTE — Telephone Encounter (Signed)
Negative COVID results given. Patient results "NOT Detected." Caller expressed understanding. ° °

## 2019-09-02 ENCOUNTER — Other Ambulatory Visit: Payer: Managed Care, Other (non HMO) | Admitting: Adult Health

## 2019-09-07 ENCOUNTER — Ambulatory Visit: Payer: Managed Care, Other (non HMO) | Admitting: Adult Health

## 2019-10-11 ENCOUNTER — Other Ambulatory Visit: Payer: Managed Care, Other (non HMO) | Admitting: Adult Health

## 2020-02-10 ENCOUNTER — Other Ambulatory Visit (HOSPITAL_COMMUNITY): Payer: Self-pay | Admitting: Surgery

## 2020-02-10 ENCOUNTER — Telehealth (HOSPITAL_COMMUNITY): Payer: Self-pay | Admitting: Surgery

## 2020-02-10 DIAGNOSIS — Z170 Estrogen receptor positive status [ER+]: Secondary | ICD-10-CM

## 2020-02-10 NOTE — Telephone Encounter (Signed)
Pt left a voicemail asking if our office could fax the labs that are needed for her next appointment with [place] to Dr. Josue Hector office.  Per Dr. Delton Coombes, the pt will need a CBC, CMP, and Vit D drawn.    The orders for these labs were faxed to Dr. Juel Burrow office and I called the pt to let her know.  She verbalized understanding and stated that once she regains her insurance she would make an appointment with our office.

## 2020-05-10 ENCOUNTER — Emergency Department (HOSPITAL_COMMUNITY): Payer: Self-pay

## 2020-05-10 ENCOUNTER — Emergency Department (HOSPITAL_BASED_OUTPATIENT_CLINIC_OR_DEPARTMENT_OTHER): Payer: Self-pay

## 2020-05-10 ENCOUNTER — Other Ambulatory Visit: Payer: Self-pay

## 2020-05-10 ENCOUNTER — Emergency Department (HOSPITAL_COMMUNITY)
Admission: EM | Admit: 2020-05-10 | Discharge: 2020-05-10 | Disposition: A | Discharge status: Home / Self Care | Payer: Self-pay | Attending: Emergency Medicine | Admitting: Emergency Medicine

## 2020-05-10 ENCOUNTER — Encounter (HOSPITAL_COMMUNITY): Payer: Self-pay

## 2020-05-10 DIAGNOSIS — I889 Nonspecific lymphadenitis, unspecified: Secondary | ICD-10-CM | POA: Insufficient documentation

## 2020-05-10 DIAGNOSIS — R52 Pain, unspecified: Secondary | ICD-10-CM

## 2020-05-10 DIAGNOSIS — R102 Pelvic and perineal pain: Secondary | ICD-10-CM | POA: Insufficient documentation

## 2020-05-10 DIAGNOSIS — R519 Headache, unspecified: Secondary | ICD-10-CM

## 2020-05-10 DIAGNOSIS — F17210 Nicotine dependence, cigarettes, uncomplicated: Secondary | ICD-10-CM | POA: Insufficient documentation

## 2020-05-10 DIAGNOSIS — R0602 Shortness of breath: Secondary | ICD-10-CM | POA: Insufficient documentation

## 2020-05-10 DIAGNOSIS — Z79899 Other long term (current) drug therapy: Secondary | ICD-10-CM | POA: Insufficient documentation

## 2020-05-10 DIAGNOSIS — I10 Essential (primary) hypertension: Secondary | ICD-10-CM | POA: Insufficient documentation

## 2020-05-10 DIAGNOSIS — G44031 Episodic paroxysmal hemicrania, intractable: Secondary | ICD-10-CM | POA: Insufficient documentation

## 2020-05-10 DIAGNOSIS — Z7982 Long term (current) use of aspirin: Secondary | ICD-10-CM | POA: Insufficient documentation

## 2020-05-10 LAB — CBC
HCT: 43.8 % (ref 36.0–46.0)
Hemoglobin: 13.9 g/dL (ref 12.0–15.0)
MCH: 27.9 pg (ref 26.0–34.0)
MCHC: 31.7 g/dL (ref 30.0–36.0)
MCV: 87.8 fL (ref 80.0–100.0)
Platelets: 303 10*3/uL (ref 150–400)
RBC: 4.99 MIL/uL (ref 3.87–5.11)
RDW: 13.2 % (ref 11.5–15.5)
WBC: 9.8 10*3/uL (ref 4.0–10.5)
nRBC: 0 % (ref 0.0–0.2)

## 2020-05-10 LAB — BASIC METABOLIC PANEL
Anion gap: 12 (ref 5–15)
BUN: 18 mg/dL (ref 6–20)
CO2: 21 mmol/L — ABNORMAL LOW (ref 22–32)
Calcium: 9.3 mg/dL (ref 8.9–10.3)
Chloride: 104 mmol/L (ref 98–111)
Creatinine, Ser: 0.89 mg/dL (ref 0.44–1.00)
GFR, Estimated: >60 mL/min (ref >60)
Glucose, Bld: 157 mg/dL — ABNORMAL HIGH (ref 70–99)
Potassium: 4.4 mmol/L (ref 3.5–5.1)
Sodium: 137 mmol/L (ref 135–145)

## 2020-05-10 LAB — BRAIN NATRIURETIC PEPTIDE: B Natriuretic Peptide: 18.1 pg/mL (ref 0.0–100.0)

## 2020-05-10 LAB — TROPONIN I (HIGH SENSITIVITY)
Troponin I (High Sensitivity): 6 ng/L (ref <18)
Troponin I (High Sensitivity): 6 ng/L (ref <18)

## 2020-05-10 LAB — I-STAT BETA HCG BLOOD, ED (MC, WL, AP ONLY): I-stat hCG, quantitative: <5 m[IU]/mL (ref <5)

## 2020-05-10 LAB — D-DIMER, QUANTITATIVE: D-Dimer, Quant: 0.42 ug/mL-FEU (ref 0.00–0.50)

## 2020-05-10 MED ORDER — METOCLOPRAMIDE HCL 5 MG/ML IJ SOLN
10.0000 mg | Freq: Once | INTRAMUSCULAR | Status: AC
  Administered 2020-05-10: 10 mg via INTRAVENOUS
  Filled 2020-05-10: qty 2

## 2020-05-10 MED ORDER — KETOROLAC TROMETHAMINE 15 MG/ML IJ SOLN
15.0000 mg | Freq: Once | INTRAMUSCULAR | Status: AC
  Administered 2020-05-10: 15 mg via INTRAVENOUS
  Filled 2020-05-10: qty 1

## 2020-05-10 MED ORDER — BUPIVACAINE HCL (PF) 0.5 % IJ SOLN
5.0000 mL | Freq: Once | INTRAMUSCULAR | Status: DC
  Filled 2020-05-10: qty 10

## 2020-05-10 MED ORDER — CEPHALEXIN 500 MG PO CAPS: 500.0000 mg | ORAL_CAPSULE | Freq: Four times a day (QID) | ORAL | 0 refills | Status: DC

## 2020-05-10 MED ORDER — BUPIVACAINE HCL 0.5 % IJ SOLN
5.0000 mL | Freq: Once | INTRAMUSCULAR | Status: DC
  Filled 2020-05-10: qty 5

## 2020-05-10 MED ORDER — DEXAMETHASONE SODIUM PHOSPHATE 10 MG/ML IJ SOLN
10.0000 mg | Freq: Once | INTRAMUSCULAR | Status: AC
  Administered 2020-05-10: 10 mg via INTRAVENOUS
  Filled 2020-05-10: qty 1

## 2020-05-10 NOTE — ED Provider Notes (Signed)
[facility] EMERGENCY DEPARTMENT Provider Note   CSN: [id number] Arrival date & time: 05/10/20  2703     History Chief Complaint  Patient presents with  . Migraine  . Shortness of Breath  . Groin Pain    Susan Benton is a 49 y.o. female.  HPI     49 year old female comes in a chief complaint of shortness of breath, groin pain and headaches.  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  She reports that her current headache started 2 weeks ago.  The headache is unilateral, mainly on the right side, behind her eye and described as sharp and throbbing pain.  She has associated photophobia, phonophobia and her symptoms are worse when she leans forward.  She thinks she has had some blurry vision associated with it although it is not present right now.  Patient denies any other focal new numbness, weakness, dizziness.  Positive nausea at some point without emesis.  No associated neck pain.  Patient denies any trauma.  Pain not worse with laying flat or at nighttime.  Patient is having pain in her right groin.  She reports that the pain was uncovered within the last few days.  When she had DVTs, she was symptom-free.  She has stopped taking Eliquis for the last 6 months because she cannot afford it due to lack of insurance.  Patient is still taking aspirin and Plavix.  She was advised to take aspirin, Plavix and Eliquis while she is on tamoxifen for 5 years.   Patient is also complaining of shortness of breath.  She has exertional shortness of breath at baseline, but now she is feeling little short of breath even at rest.  There is no associated chest pain, dizziness, near fainting, cough.  Past Medical History:  Diagnosis Date  . Anxiety   . Arthritis   . Depression 2013  . Fibroids, submucosal 10/04/2016  . Headache   .  Hep C w/o coma, chronic ([place]) 06/27/2014   had treatment   . Hx of right and left heart catheterization 2014   had cath in [place], no intervention  . Hypertension 1987  . 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 Low iron   . Restless legs syndrome     Patient Active Problem List   Diagnosis Date Noted  . Acquired absence of breast and absent nipple, left 12/25/2016  . S/P right mastectomy 10/23/2016  . Malignant neoplasm of upper-outer quadrant of right female breast ([place])   . Family history of breast cancer   . Fibroids, submucosal 10/04/2016  . Menstrual cramps 09/25/2016  . Menorrhagia with regular cycle 09/25/2016  . Mass of upper outer quadrant of right breast 09/25/2016  . Depression   . Hypertension     Past Surgical History:  Procedure Laterality Date  . AXILLARY SENTINEL NODE BIOPSY Right 11/20/2016   Procedure: AXILLARY SENTINEL NODE BIOPSY;  Surgeon: Aviva Signs, MD;  Location: [facility];  Service: General;  Laterality: Right;  Sentinel Node Injection @ 10:30am  . BREAST RECONSTRUCTION WITH PLACEMENT OF TISSUE EXPANDER AND FLEX HD (ACELLULAR HYDRATED DERMIS) Bilateral 12/25/2016   Procedure: BREAST RECONSTRUCTION WITH PLACEMENT OF TISSUE EXPANDER AND FLEX HD (ACELLULAR HYDRATED DERMIS);  Surgeon: Wallace Going, DO;  Location: [facility];  Service: Plastics;  Laterality: Bilateral;  . CARDIAC CATHETERIZATION    .  DILITATION & CURRETTAGE/HYSTROSCOPY WITH NOVASURE ABLATION N/A 10/23/2016   Procedure: DILATATION & CURETTAGE/HYSTEROSCOPY WITH NOVASURE ENDOMETRIAL ABLATION;  Surgeon: Florian Buff, MD;  Location: [facility];  Service: Gynecology;  Laterality: N/A;  . MASTECTOMY MODIFIED RADICAL Right 10/23/2016   Procedure: MASTECTOMY MODIFIED RADICAL;  Surgeon: Aviva Signs, MD;  Location: [facility];  Service: General;  Laterality: Right;  . SIMPLE MASTECTOMY WITH AXILLARY SENTINEL NODE BIOPSY Left 10/23/2016   Procedure: SIMPLE MASTECTOMY;  Surgeon:  Aviva Signs, MD;  Location: [facility];  Service: General;  Laterality: Left;  . TISSUE EXPANDER PLACEMENT Bilateral 02/27/2017   Procedure: REMOVAL TISSUE EXPANDER;  Surgeon: Wallace Going, DO;  Location: [facility];  Service: Plastics;  Laterality: Bilateral;  . TONSILECTOMY, ADENOIDECTOMY, BILATERAL MYRINGOTOMY AND TUBES Bilateral 1984  . TUBAL LIGATION  1998   2000     OB History    Gravida  2   Para  2   Term      Preterm      AB      Living        SAB      IAB      Ectopic      Multiple      Live Births              Family History  Problem Relation Age of Onset  . Hypertension Mother   . Cancer Mother   . Diabetes Father   . Hypertension Father   . Kidney disease Father   . Hyperlipidemia Father   . Asthma Father   . COPD Father   . Heart disease Father   . HIV/AIDS Brother   . Cancer Maternal Grandmother        breast  . Breast cancer Maternal Grandmother   . Hypertension Son     Social History   Tobacco Use  . Smoking status: Current Every Day Smoker    Packs/day: 0.25    Years: 20.00    Pack years: 5.00    Types: Cigarettes    Last attempt to quit: 07/20/2016    Years since quitting: 3.8  . Smokeless tobacco: Never Used  . Tobacco comment: 3-4 per day  Vaping Use  . Vaping Use: Former  Substance Use Topics  . Alcohol use: No  . Drug use: No    Home Medications Prior to Admission medications   Medication Sig Start Date End Date Taking? Authorizing Provider  ALPRAZolam (XANAX) 0.5 MG tablet TAKE 1 TABLET BY MOUTH TWICE A DAY AS NEEDED FOR ANXIETY Patient taking differently: Take 0.5 mg by mouth 2 (two) times daily as needed for anxiety. 06/13/17  Yes Holley Bouche, NP  aspirin 81 MG EC tablet Take 81 mg by mouth daily. 12/17/17  Yes [provider]  Butalbital-APAP-Caffeine 50-325-40 MG capsule Take 1 capsule by mouth every 6 (six) hours as needed for headache.   Yes [provider]  clopidogrel (PLAVIX) 75 MG  tablet Take 75 mg by mouth daily. 12/28/18  Yes [provider]  gabapentin (NEURONTIN) 300 MG capsule TAKE ONE CAPSULE BY MOUTH 3 TIMES A DAY Patient taking differently: Take 300 mg by mouth 3 (three) times daily. 01/06/17  Yes Susy Frizzle, MD  lisinopril (PRINIVIL,ZESTRIL) 10 MG tablet TAKE 1 TABLET(10 MG) BY MOUTH DAILY Patient taking differently: Take 10 mg by mouth daily. 04/30/16  Yes Susy Frizzle, MD  olmesartan (BENICAR) 40 MG tablet Take 40 mg by mouth daily. 04/26/20  Yes [provider]  oxyCODONE (ROXICODONE) 15 MG immediate release tablet Take 15 mg by mouth in the morning, at noon, and at bedtime.   Yes [provider]  rOPINIRole (REQUIP) 2 MG tablet 1-2 tabs po qhs Patient taking differently: Take 2-4 mg by mouth at bedtime. 07/11/16  Yes Susy Frizzle, MD  tamoxifen (NOLVADEX) 20 MG tablet TAKE 1 TABLET BY MOUTH EVERYDAY AT BEDTIME Patient taking differently: Take 20 mg by mouth at bedtime. 02/19/18  Yes Derek Jack, MD  atenolol (TENORMIN) 100 MG tablet Take 1 tablet (100 mg total) by mouth daily. Patient not taking: Reported on 05/10/2020 11/05/16   Aviva Signs, MD  cephALEXin (KEFLEX) 500 MG capsule Take 1 capsule (500 mg total) by mouth 4 (four) times daily. 05/10/20   Varney Biles, MD  ELIQUIS 5 MG TABS tablet Take 1 tablet (5 mg total) by mouth 2 (two) times daily. Patient not taking: No sig reported 11/12/18   Derek Jack, MD  methocarbamol (ROBAXIN) 500 MG tablet Take 1 tablet (500 mg total) by mouth at bedtime as needed for muscle spasms. Patient not taking: No sig reported 03/26/17   Avie Echevaria B, PA-C  naproxen (NAPROSYN) 500 MG tablet Take 1 tablet (500 mg total) by mouth 2 (two) times daily with a meal. Patient not taking: No sig reported 03/26/17   Avie Echevaria B, PA-C  venlafaxine XR (EFFEXOR-XR) 150 MG 24 hr capsule TAKE 1 CAPSULE (150 MG TOTAL) BY MOUTH DAILY WITH BREAKFAST. Patient not taking:  No sig reported 12/14/18   Derek Jack, MD    Allergies    Tramadol  Review of Systems   Review of Systems  Constitutional: Positive for activity change.  Eyes: Positive for photophobia and visual disturbance.  Respiratory: Positive for shortness of breath. Negative for cough.   Cardiovascular: Negative for chest pain.  Gastrointestinal: Positive for nausea. Negative for vomiting.  Skin: Negative for rash.  Neurological: Positive for headaches. Negative for dizziness, syncope, speech difficulty, weakness, light-headedness and numbness.  Hematological: Does not bruise/bleed easily.  All other systems reviewed and are negative.   Physical Exam Updated Vital Signs BP (!) 151/83   Pulse 95   Temp 98.3 F (36.8 C) (Oral)   Resp 13   SpO2 93%   Physical Exam Vitals and nursing note reviewed.  Constitutional:      Appearance: She is well-developed.  HENT:     Head: Normocephalic and atraumatic.  Eyes:     Extraocular Movements: EOM normal.  Cardiovascular:     Rate and Rhythm: Normal rate.  Pulmonary:     Effort: Pulmonary effort is normal.     Breath sounds: No wheezing, rhonchi or rales.  Abdominal:     General: Bowel sounds are normal.  Musculoskeletal:     Cervical back: Normal range of motion and neck supple.     Right lower leg: No tenderness. No edema.     Left lower leg: No tenderness. No edema.     Comments: Patient has tenderness over the right groin.  There is palpable nodule without any fluctuance, erythema  Skin:    General: Skin is warm and dry.  Neurological:     Mental Status: She is alert and oriented to person, place, and time.     Comments: Cerebellar exam is normal (finger to nose) Sensory exam normal for bilateral upper and lower extremities - and patient is able to discriminate between sharp and dull. Motor exam is 4+/5  ED Results / Procedures / Treatments   Labs (all labs ordered are listed, but only abnormal results are  displayed) Labs Reviewed  BASIC METABOLIC PANEL - Abnormal; Notable for the following components:      Result Value   CO2 21 (*)    Glucose, Bld 157 (*)    All other components within normal limits  CBC  D-DIMER, QUANTITATIVE (NOT AT [facility])  BRAIN NATRIURETIC PEPTIDE  I-STAT BETA HCG BLOOD, ED ([facility], [facility], [facility] ONLY)  TROPONIN I (HIGH SENSITIVITY)  TROPONIN I (HIGH SENSITIVITY)    EKG EKG Interpretation  Date/Time:  Wednesday May 10 2020 07:38:16 EST Ventricular Rate:  114 PR Interval:  132 QRS Duration: 84 QT Interval:  336 QTC Calculation: 463 R Axis:   55 Text Interpretation: Sinus tachycardia Otherwise normal ECG No acute changes No significant change since last tracing Confirmed by Varney Biles ([id number]) on 05/10/2020 8:37:10 AM   Radiology DG Chest 2 View  Result Date: 05/10/2020 CLINICAL DATA:  Shortness of breath EXAM: CHEST - 2 VIEW COMPARISON:  September 05, 2019 FINDINGS: Lungs are clear. Heart size and pulmonary vascularity are normal. No adenopathy. No pneumothorax. No bone lesions. IMPRESSION: Lungs clear.  Cardiac silhouette normal. Electronically Signed   By: Lowella Grip III M.D.   On: 05/10/2020 08:17   CT HEAD WO CONTRAST  Result Date: 05/10/2020 CLINICAL DATA:  Headache. EXAM: CT HEAD WITHOUT CONTRAST TECHNIQUE: Contiguous axial images were obtained from the base of the skull through the vertex without intravenous contrast. COMPARISON:  09/05/2019 FINDINGS: Brain: There is no evidence for acute hemorrhage, hydrocephalus, mass lesion, or abnormal extra-axial fluid collection. No definite CT evidence for acute infarction. Diffuse loss of parenchymal volume is consistent with atrophy. Vascular: No hyperdense vessel or unexpected calcification. Skull: No evidence for fracture. No worrisome lytic or sclerotic lesion. Sinuses/Orbits: The visualized paranasal sinuses and mastoid air cells are clear. Visualized portions of the globes and intraorbital fat are  unremarkable. Other: None. IMPRESSION: 1. No acute intracranial abnormality. 2. Age advanced atrophy. Electronically Signed   By: Misty Stanley M.D.   On: 05/10/2020 08:05   VAS [place] LOWER EXTREMITY VENOUS (DVT) (ONLY [facility] & [facility] 7a-7p)  Result Date: 05/10/2020  Lower Venous DVT Study Indications: Pain.  Comparison Study: no prior Performing Technologist: Abram Sander RVS  Examination Guidelines: A complete evaluation includes B-mode imaging, spectral Doppler, color Doppler, and power Doppler as needed of all accessible portions of each vessel. Bilateral testing is considered an integral part of a complete examination. Limited examinations for reoccurring indications may be performed as noted. The reflux portion of the exam is performed with the patient in reverse Trendelenburg.  +---------+---------------+---------+-----------+----------+--------------+ RIGHT    CompressibilityPhasicitySpontaneityPropertiesThrombus Aging +---------+---------------+---------+-----------+----------+--------------+ CFV      Full           Yes      Yes                                 +---------+---------------+---------+-----------+----------+--------------+ SFJ      Full                                                        +---------+---------------+---------+-----------+----------+--------------+ FV Prox  Full                                                        +---------+---------------+---------+-----------+----------+--------------+  FV Mid   Full                                                        +---------+---------------+---------+-----------+----------+--------------+ FV DistalFull                                                        +---------+---------------+---------+-----------+----------+--------------+ PFV      Full                                                        +---------+---------------+---------+-----------+----------+--------------+ POP      Full            Yes      Yes                                 +---------+---------------+---------+-----------+----------+--------------+ PTV      Full                                                        +---------+---------------+---------+-----------+----------+--------------+ PERO     Full                                                        +---------+---------------+---------+-----------+----------+--------------+   +---------+---------------+---------+-----------+----------+--------------+ LEFT     CompressibilityPhasicitySpontaneityPropertiesThrombus Aging +---------+---------------+---------+-----------+----------+--------------+ CFV      Full           Yes      Yes                                 +---------+---------------+---------+-----------+----------+--------------+ SFJ      Full                                                        +---------+---------------+---------+-----------+----------+--------------+ FV Prox  Full                                                        +---------+---------------+---------+-----------+----------+--------------+ FV Mid   Full                                                        +---------+---------------+---------+-----------+----------+--------------+  FV DistalFull                                                        +---------+---------------+---------+-----------+----------+--------------+ PFV      Full                                                        +---------+---------------+---------+-----------+----------+--------------+ POP      Full           Yes      Yes                                 +---------+---------------+---------+-----------+----------+--------------+ PTV      Full                                                        +---------+---------------+---------+-----------+----------+--------------+ PERO     Full                                                         +---------+---------------+---------+-----------+----------+--------------+     Summary: BILATERAL: - No evidence of deep vein thrombosis seen in the lower extremities, bilaterally. - No evidence of superficial venous thrombosis in the lower extremities, bilaterally. -No evidence of popliteal cyst, bilaterally.   *See table(s) above for measurements and observations.    Preliminary     Procedures Procedures (including critical care time)  Medications Ordered in ED Medications  bupivacaine (MARCAINE) 0.5 % injection 5 mL (has no administration in time range)  metoCLOPramide (REGLAN) injection 10 mg (10 mg Intravenous Given 05/10/20 0909)  dexamethasone (DECADRON) injection 10 mg (10 mg Intravenous Given 05/10/20 0910)  ketorolac (TORADOL) 15 MG/ML injection 15 mg (15 mg Intravenous Given 05/10/20 2440)    ED Course  I have reviewed the triage vital signs and the nursing notes.  Pertinent labs & imaging results that were available during my care of the patient were reviewed by me and considered in my medical decision making (see chart for details).    MDM Rules/Calculators/A&P                          49 year old comes in a chief complaint of headache, shortness of breath and groin pain.  She has known history of DVT.  Patient has history of breast cancer, in remission but taking tamoxifen.  She is supposed to be taking aspirin, Plavix and Eliquis while she is on this medication, however has not been able to afford the latter.  Given this history, D-dimer was ordered for moderate risk for PE on Wells criteria and it is negative.  Ultrasound DVT was ordered given high risk for DVT on Wells criteria and it does not show any DVT.  Results of  the ER work-up discussed with the patient.  She does not want [place] to give her prescription for DOAC as she will not be able to afford it.  He does not want 1 month free coupon.  She would rather just wait for 3 months and get her insurance squared  up and then start back on it.  Patient made aware that she can develop a blood clot in her legs or lungs in the future given the rate she carries for clotting.  Patient comfortable with the risk for now.  Additionally I think she is likely having lymphadenitis in the groin.  We will start her on antibiotics.  I do not think there is any abscess.  Patient is also complaining of left axilla lymphadenopathy on reassessment.  I advised her to make sure she follows up with oncology service given that she has history of breast cancer.  Last she was seen by them was in 2020.   Finally, patient is also complaining of headache.  She has history of headaches, but these appear to be little different.  They are behind her right eye primarily but sometimes migrate to the left side.  There is photophobia, phonophobia.  Pain started 2 weeks ago and have gradually worsened.  No neuro deficits at the time.  Hard to call that this is migraine.  Clinically does not meet criteria for tension headache.  Cluster headache also considered.  For now we will treat her with NSAIDs.  CT head was done in the triage and it does not reveal any signs of tumor.  She does not have risk factors or history findings for subarachnoid hemorrhage.  It does not appear that she is having idiopathic intracranial hypertension.  Patient advised to keep taking ibuprofen.  We have recommended that she follow-up with neurology and to return to the ER if she starts developing neurologic symptoms.  Cerebral venous thrombosis work-up could be initiated if the headaches persist or continue -but the suspicion of that is lower given that she has been taking aspirin and Plavix religiously for the last several months.  I do not think patient is having glaucoma, there is no visual disturbance and the pain is intermittent/waxing and waning.  Patient did not respond to the initial headache cocktail.  We decided to get sphenopalatine ganglion nerve block, but by the  time the bupivacaine arrived patient wanted to leave as her son was already in the triage and had to go to work.  Strict ER return precautions have been discussed, and patient is agreeing with the plan and is comfortable with the workup done and the recommendations from the ER.    Final Clinical Impression(s) / ED Diagnoses Final diagnoses:  Episodic paroxysmal hemicrania, intractable  Shortness of breath  Bad headache  Lymphadenitis    Rx / DC Orders ED Discharge Orders         Ordered    cephALEXin (KEFLEX) 500 MG capsule  4 times daily        05/10/20 1244           Varney Biles, MD 05/10/20 1254

## 2020-05-10 NOTE — Progress Notes (Signed)
Lower extremity venous has been completed.   Preliminary results in CV Proc.   Abram Sander 05/10/2020 10:35 AM

## 2020-05-10 NOTE — ED Notes (Signed)
Pt reports headache is not improving at this time

## 2020-05-10 NOTE — ED Triage Notes (Signed)
Pt has multiple complaints: Pt reports intermittent migraines for the past 2 weeks with associated blurred vision. Describes as pressure behind her eyes. Pt also reports sob on exertion that started this morning and right sided groin pain. 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 Pt a.o, resp e.u

## 2020-05-22 NOTE — Progress Notes (Signed)
[facility] [address] [place] [address]   Patient Care Team: Benita Stabile, MD as PCP - General (Internal Medicine) Doreatha Massed, MD as Consulting Physician (Oncology)  SUMMARY OF ONCOLOGIC HISTORY: Oncology History  Malignant neoplasm of upper-outer quadrant of right female breast [facility])   Initial Diagnosis   Malignant neoplasm of upper-outer quadrant of right female breast (HCC)   10/08/2016 Initial Biopsy   (R) breast needle biopsy (10:00): Invasive ductal carcinoma, grade 1-2, with DCIS.  ER+ (100%), PR+ (100%), Ki67 10%, HER2 neg (ratio 1.03).    10/23/2016 Surgery   Bilateral mastectomies [facility])   10/23/2016 Pathology Results   RIGHT mastectomy: Multifocal IDC, grade 1, spanning 4.6 cm & 0.6 cm. Low grade DCIS. Negative margins. HER2 repeated and neg (ratio 1.36).  LEFT mastectomy: Fibroadenoma, no malignancy.    11/20/2016 Surgery   (R) axillary sentinel lymph node biopsy [facility])   11/20/2016 Pathology Results   0/1 axillary sentinel LN biopsy for carcinoma.    11/28/2016 Oncotype testing   Recurrence score: 9 (low risk); associated with 6% risk of distant recurrence.    12/03/2016 Imaging   CT chest: IMPRESSION: 1. 7.7 cm lobulated fluid attenuation collection in the right axilla, could represent a postoperative fluid collection, with or without presence of infection. Further management will need to be based on clinical grounds. Could correlate with targeted sonography and aspiration as indicated. 2. Clear lung fields 3. Hepatic steatosis 4. Aortic Atherosclerosis   12/13/2016 Miscellaneous   BCI genomic testing revealed LOW risk of recurrence with 4.3% risk and LOW likelihood of benefit of extension of therapy beyond 5 years.       CHIEF COMPLIANT: Hospital follow-up; follow-up for right breast cancer   INTERVAL HISTORY: [name] is a 49 y.o. female here today for follow up of her hospital follow-up and follow-up for  her right breast cancer. Her last visit was on 11/12/2018. She went to [facility] on 05/10/2020 complaining of SOB, groin pain and headaches.  Today she reports that she had hematuria for couple of days which resolved.  She denies any dysuria.  No fevers or chills reported.  She took 10 days of antibiotics which were given in the ER for right groin lymphadenitis.  She reported pain in the left lower quadrant and pain in the left axillary region which is new.  She also has headaches behind both eyes.   REVIEW OF SYSTEMS:   Review of Systems  Constitutional: Positive for fatigue.  Neurological: Positive for headaches.  All other systems reviewed and are negative.   I have reviewed the past medical history, past surgical history, social history and family history with the patient and they are unchanged from previous note.   ALLERGIES:   is allergic to tramadol.   MEDICATIONS:  Current Outpatient Medications  Medication Sig Dispense Refill  . ALPRAZolam (XANAX) 0.5 MG tablet TAKE 1 TABLET BY MOUTH TWICE A DAY AS NEEDED FOR ANXIETY (Patient taking differently: Take 0.5 mg by mouth 2 (two) times daily as needed for anxiety.) 30 tablet 0  . aspirin 81 MG EC tablet Take 81 mg by mouth daily.    Marland Kitchen atenolol (TENORMIN) 100 MG tablet Take 1 tablet (100 mg total) by mouth daily. (Patient not taking: Reported on 05/10/2020) 30 tablet 1  . Butalbital-APAP-Caffeine 50-325-40 MG capsule Take 1 capsule by mouth every 6 (six) hours as needed for headache.    . cephALEXin (KEFLEX) 500 MG capsule Take 1 capsule (  500 mg total) by mouth 4 (four) times daily. 28 capsule 0  . clopidogrel (PLAVIX) 75 MG tablet Take 75 mg by mouth daily.    Marland Kitchen ELIQUIS 5 MG TABS tablet Take 1 tablet (5 mg total) by mouth 2 (two) times daily. (Patient not taking: No sig reported) 60 tablet 3  . gabapentin (NEURONTIN) 300 MG capsule TAKE ONE CAPSULE BY MOUTH 3 TIMES A DAY (Patient taking differently: Take 300 mg by mouth 3 (three) times  daily.) 90 capsule 2  . lisinopril (PRINIVIL,ZESTRIL) 10 MG tablet TAKE 1 TABLET(10 MG) BY MOUTH DAILY (Patient taking differently: Take 10 mg by mouth daily.) 30 tablet 11  . methocarbamol (ROBAXIN) 500 MG tablet Take 1 tablet (500 mg total) by mouth at bedtime as needed for muscle spasms. (Patient not taking: No sig reported) 10 tablet 0  . naproxen (NAPROSYN) 500 MG tablet Take 1 tablet (500 mg total) by mouth 2 (two) times daily with a meal. (Patient not taking: No sig reported) 30 tablet 0  . olmesartan (BENICAR) 40 MG tablet Take 40 mg by mouth daily.    Marland Kitchen oxyCODONE (ROXICODONE) 15 MG immediate release tablet Take 15 mg by mouth in the morning, at noon, and at bedtime.    Marland Kitchen rOPINIRole (REQUIP) 2 MG tablet 1-2 tabs po qhs (Patient taking differently: Take 2-4 mg by mouth at bedtime.) 60 tablet 3  . tamoxifen (NOLVADEX) 20 MG tablet TAKE 1 TABLET BY MOUTH EVERYDAY AT BEDTIME (Patient taking differently: Take 20 mg by mouth at bedtime.) 90 tablet 0  . venlafaxine XR (EFFEXOR-XR) 150 MG 24 hr capsule TAKE 1 CAPSULE (150 MG TOTAL) BY MOUTH DAILY WITH BREAKFAST. (Patient not taking: No sig reported) 90 capsule 3   No current facility-administered medications for this visit.     PHYSICAL EXAMINATION: Performance status (ECOG): 0 - Asymptomatic  There were no vitals filed for this visit. Wt Readings from Last 3 Encounters:  11/12/18 216 lb 6.4 oz (98.2 kg)  12/26/17 226 lb (102.5 kg)  12/09/17 226 lb 14.4 oz (102.9 kg)   Physical Exam Vitals reviewed.  Constitutional:      Appearance: Normal appearance.  Cardiovascular:     Heart sounds: Normal heart sounds.  Abdominal:     General: There is no distension.     Palpations: Abdomen is soft. There is no mass.     Tenderness: There is no abdominal tenderness.  Musculoskeletal:        General: No swelling.  Skin:    General: Skin is warm.  Neurological:     General: No focal deficit present.     Mental Status: She is alert and  oriented to person, place, and time.  Psychiatric:        Mood and Affect: Mood normal.        Behavior: Behavior normal.   Bilateral mastectomy sites within normal limits with no suspicious masses.  No axillary lymphadenopathy.  Breast Exam Chaperone: Satira Anis, LPN   LABORATORY DATA:  I have reviewed the data as listed CMP Latest Ref Rng & Units 05/10/2020 11/11/2018 01/05/2018  Glucose 70 - 99 mg/dL 157(H) 102(H) -  BUN 6 - 20 mg/dL 18 14 -  Creatinine 0.44 - 1.00 mg/dL 0.89 0.52 0.80  Sodium 135 - 145 mmol/L 137 139 -  Potassium 3.5 - 5.1 mmol/L 4.4 4.9 -  Chloride 98 - 111 mmol/L 104 102 -  CO2 22 - 32 mmol/L 21(L) 26 -  Calcium 8.9 - 10.3 mg/dL  9.3 9.3 -  Total Protein 6.5 - 8.1 g/dL - 7.5 -  Total Bilirubin 0.3 - 1.2 mg/dL - 0.3 -  Alkaline Phos 38 - 126 U/L - 61 -  AST 15 - 41 U/L - 22 -  ALT 0 - 44 U/L - 22 -   No results found for: PYY511 Lab Results  Component Value Date   WBC 9.8 05/10/2020   HGB 13.9 05/10/2020   HCT 43.8 05/10/2020   MCV 87.8 05/10/2020   PLT 303 05/10/2020   NEUTROABS 5.7 11/11/2018    ASSESSMENT:  1.  Stage Ia right breast IDC, ER/PR positive, HER-2 negative: - Right mastectomy and left simple mastectomy on 10/08/2016, PT2NX, Ki-67 10%, multifocal, 4.6 cm, 0.6 cm, grade 1, margins negative. - Oncotype DX score was 9. -Tamoxifen started in October 2018.   2.  Hot flashes: -She has hot flashes during day and night.  3.  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 - It is unclear if tamoxifen has any contribution to it.  She was started on Plavix and Eliquis.    PLAN:  1.  Stage Ia right breast IDC, ER/PR positive, HER-2 negative: -She reports that she has been taking tamoxifen without fail. -Reviewed recent labs from ER visit on 05/10/2020.  BMP and CBC were normal.  D-dimer was normal. -She reports new onset  pains in the left lateral chest wall as well as left lower quadrant.  She had couple of days of hematuria. -Will check LFTs, CA 15-3, urinalysis. -We will also check CT CAP given history of breast cancer. -We will do virtual follow-up visit after the imaging and labs.  2.  Hot flashes: -She is taking gabapentin 300 mg at bedtime.  She stopped taking venlafaxine as it did not help.  3.  Spontaneous SMA dissection and thrombus: -She is taking Plavix and aspirin. -She stopped taking Eliquis as it was expensive.   No orders of the defined types were placed in this encounter.  The patient has a good understanding of the overall plan. she agrees with it. she will call with any problems that may develop before the next visit here.    Derek Jack, MD [place] [phone]   I, Milinda Antis, am acting as a scribe for Dr. Sanda Linger.  I, Derek Jack MD, have reviewed the above documentation for accuracy and completeness, and I agree with the above.

## 2020-05-23 ENCOUNTER — Other Ambulatory Visit: Payer: Self-pay

## 2020-05-23 ENCOUNTER — Inpatient Hospital Stay (HOSPITAL_COMMUNITY): Payer: Self-pay | Attending: Hematology | Admitting: Hematology

## 2020-05-23 VITALS — BP 141/82 | HR 80 | Temp 98.4°F | Resp 18 | Wt 250.0 lb

## 2020-05-23 DIAGNOSIS — Z170 Estrogen receptor positive status [ER+]: Secondary | ICD-10-CM | POA: Insufficient documentation

## 2020-05-23 DIAGNOSIS — R531 Weakness: Secondary | ICD-10-CM | POA: Insufficient documentation

## 2020-05-23 DIAGNOSIS — K760 Fatty (change of) liver, not elsewhere classified: Secondary | ICD-10-CM | POA: Insufficient documentation

## 2020-05-23 DIAGNOSIS — Z09 Encounter for follow-up examination after completed treatment for conditions other than malignant neoplasm: Secondary | ICD-10-CM

## 2020-05-23 DIAGNOSIS — R232 Flushing: Secondary | ICD-10-CM | POA: Insufficient documentation

## 2020-05-23 DIAGNOSIS — C50411 Malignant neoplasm of upper-outer quadrant of right female breast: Secondary | ICD-10-CM | POA: Insufficient documentation

## 2020-05-23 DIAGNOSIS — I700 Atherosclerosis of aorta: Secondary | ICD-10-CM | POA: Insufficient documentation

## 2020-05-23 DIAGNOSIS — Z7982 Long term (current) use of aspirin: Secondary | ICD-10-CM | POA: Insufficient documentation

## 2020-05-23 DIAGNOSIS — Z7901 Long term (current) use of anticoagulants: Secondary | ICD-10-CM | POA: Insufficient documentation

## 2020-05-23 DIAGNOSIS — Z9013 Acquired absence of bilateral breasts and nipples: Secondary | ICD-10-CM | POA: Insufficient documentation

## 2020-05-23 DIAGNOSIS — Z79899 Other long term (current) drug therapy: Secondary | ICD-10-CM | POA: Insufficient documentation

## 2020-05-23 DIAGNOSIS — R5383 Other fatigue: Secondary | ICD-10-CM | POA: Insufficient documentation

## 2020-05-23 NOTE — Patient Instructions (Signed)
[facility] Discharge Instructions  You were seen and examined by Dr. Katragadda. Follow up as scheduled.   Thank you for choosing [facility] at [facility] to provide your oncology and hematology care.  To afford each patient quality time with our provider, please arrive at least 15 minutes before your scheduled appointment time.   If you have a lab appointment with the Cancer Center please come in thru the Main Entrance and check in at the main information desk.  You need to re-schedule your appointment should you arrive 10 or more minutes late.  We strive to give you quality time with our providers, and arriving late affects you and other patients whose appointments are after yours.  Also, if you no show three or more times for appointments you may be dismissed from the clinic at the providers discretion.     Again, thank you for choosing [facility].  Our hope is that these requests will decrease the amount of time that you wait before being seen by our physicians.       _____________________________________________________________  Should you have questions after your visit to [facility], please contact our office at [phone] and follow the prompts.  Our office hours are 8:00 a.m. and 4:30 p.m. Monday - Friday.  Please note that voicemails left after 4:00 p.m. may not be returned until the following business day.  We are closed weekends and major holidays.  You do have access to a nurse 24-7, just call the main number to the clinic [phone] and do not press any options, hold on the line and a nurse will answer the phone.    For prescription refill requests, have your pharmacy contact our office and allow 72 hours.    Due to Covid, you will need to wear a mask upon entering the hospital. If you do not have a mask, a mask will be given to you at the Main Entrance upon arrival. For doctor visits,  patients may have 1 support person age 18 or older with them. For treatment visits, patients can not have anyone with them due to social distancing guidelines and our immunocompromised population.      

## 2020-05-25 ENCOUNTER — Other Ambulatory Visit (HOSPITAL_COMMUNITY): Admission: RE | Admit: 2020-05-25 | Payer: Self-pay | Source: Other Acute Inpatient Hospital | Admitting: Hematology

## 2020-05-25 LAB — URINALYSIS, ROUTINE W REFLEX MICROSCOPIC
Bilirubin Urine: NEGATIVE
Glucose, UA: NEGATIVE mg/dL
Ketones, ur: NEGATIVE mg/dL
Leukocytes,Ua: NEGATIVE
Nitrite: NEGATIVE
Protein, ur: NEGATIVE mg/dL
Specific Gravity, Urine: 1.019 (ref 1.005–1.030)
pH: 5 (ref 5.0–8.0)

## 2020-05-25 LAB — HEPATIC FUNCTION PANEL
ALT: 41 U/L (ref 0–44)
AST: 40 U/L (ref 15–41)
Albumin: 3.6 g/dL (ref 3.5–5.0)
Alkaline Phosphatase: 65 U/L (ref 38–126)
Bilirubin, Direct: <0.1 mg/dL (ref 0.0–0.2)
Total Bilirubin: 0.2 mg/dL — ABNORMAL LOW (ref 0.3–1.2)
Total Protein: 7 g/dL (ref 6.5–8.1)

## 2020-05-25 LAB — HEPATITIS B CORE ANTIBODY, TOTAL: Hep B Core Total Ab: NONREACTIVE

## 2020-05-25 LAB — HEPATITIS B SURFACE ANTIGEN: Hepatitis B Surface Ag: NONREACTIVE

## 2020-05-25 LAB — HEPATITIS B SURFACE ANTIBODY,QUALITATIVE: Hep B S Ab: REACTIVE — AB

## 2020-05-27 HISTORY — PX: BLADDER REPAIR: SHX76

## 2020-05-27 LAB — CANCER ANTIGEN 15-3: CA 15-3: 14.8 U/mL (ref 0.0–25.0)

## 2020-05-30 ENCOUNTER — Telehealth: Payer: Self-pay

## 2020-05-30 ENCOUNTER — Encounter: Payer: Self-pay | Admitting: Neurology

## 2020-05-30 ENCOUNTER — Ambulatory Visit: Payer: Self-pay | Admitting: Neurology

## 2020-05-30 LAB — CANCER ANTIGEN 27.29: CA 27.29: 14.8 U/mL (ref 0.0–38.6)

## 2020-05-30 NOTE — Telephone Encounter (Signed)
Pt did not show for their appt with Dr. Athar today.  

## 2020-05-31 LAB — HEPATITIS C VRS RNA DETECT BY PCR-QUAL: Hepatitis C Vrs RNA by PCR-Qual: NEGATIVE

## 2020-06-08 ENCOUNTER — Ambulatory Visit (HOSPITAL_COMMUNITY)
Admission: RE | Admit: 2020-06-08 | Discharge: 2020-06-08 | Disposition: A | Discharge status: Home / Self Care | Payer: Self-pay | Source: Ambulatory Visit | Attending: Hematology | Admitting: Hematology

## 2020-06-08 ENCOUNTER — Other Ambulatory Visit: Payer: Self-pay

## 2020-06-08 ENCOUNTER — Emergency Department (HOSPITAL_COMMUNITY)
Admission: EM | Admit: 2020-06-08 | Discharge: 2020-06-08 | Disposition: A | Discharge status: Home / Self Care | Payer: Self-pay

## 2020-06-08 DIAGNOSIS — C50411 Malignant neoplasm of upper-outer quadrant of right female breast: Secondary | ICD-10-CM | POA: Insufficient documentation

## 2020-06-08 DIAGNOSIS — Z09 Encounter for follow-up examination after completed treatment for conditions other than malignant neoplasm: Secondary | ICD-10-CM | POA: Insufficient documentation

## 2020-06-08 DIAGNOSIS — Z170 Estrogen receptor positive status [ER+]: Secondary | ICD-10-CM | POA: Insufficient documentation

## 2020-06-08 MED ORDER — IOHEXOL 300 MG/ML  SOLN
100.0000 mL | Freq: Once | INTRAMUSCULAR | Status: AC | PRN
  Administered 2020-06-08: 100 mL via INTRAVENOUS

## 2020-06-13 ENCOUNTER — Other Ambulatory Visit: Payer: Self-pay

## 2020-06-13 ENCOUNTER — Inpatient Hospital Stay (HOSPITAL_COMMUNITY): Payer: Self-pay | Attending: Hematology | Admitting: Hematology

## 2020-06-13 DIAGNOSIS — Z170 Estrogen receptor positive status [ER+]: Secondary | ICD-10-CM

## 2020-06-13 DIAGNOSIS — C50411 Malignant neoplasm of upper-outer quadrant of right female breast: Secondary | ICD-10-CM

## 2020-06-13 NOTE — Progress Notes (Signed)
Virtual Visit via Telephone Note  I connected with Susan Benton on 06/13/20 at  4:15 PM EST by telephone and verified that I am speaking with the correct person using two identifiers.  Location: Patient: At home Provider: In the office   I discussed the limitations, risks, security and privacy concerns of performing an evaluation and management service by telephone and the availability of in person appointments. I also discussed with the patient that there may be a patient responsible charge related to this service. The patient expressed understanding and agreed to proceed.   History of Present Illness: Patient evaluated in our office for stage I right breast infiltrating ductal carcinoma, diagnosed in 2018, status post right mastectomy and left simple mastectomy on 10/08/2016, 4.6 cm and 0.6 cm, grade 1, margins negative, Oncotype DX score 9.  Tamoxifen was started around October 2018.   Observations/Objective: She reports having intermittent hematuria since mid December.  Denies any prior history of kidney or bladder stones.  She took antibiotic twice for possible UTI which did not clear up the hematuria.  Recently her paternal aunt was reportedly diagnosed with bladder cancer.  Assessment and Plan:  1.  Stage Ia right breast IDC, ER/PR positive, HER2 negative: - I have reviewed her labs which showed normal tumor markers and LFTs. - Reviewed CT CAP from 06/08/2020 which did not show any evidence of metastatic disease in the chest, abdomen or pelvis.  Chronic mild diffuse bronchial wall thickening with faint centrilobular groundglass micro nodularity in the upper lobes unchanged.  Asymmetric left renal atrophy with no hydronephrosis. - There is diffuse hepatic steatosis with mild hepatomegaly.  LFTs were normal. - She does not have any left chest wall pain at this time.  RTC 6 months with repeat labs and physical exam.  2.  Hot flashes: - Continue gabapentin 300 mg at bedtime.  3.   Intermittent hematuria: - She reports hematuria every second day.  No history of stones. - We will refer to urology.   Follow Up Instructions:    I discussed the assessment and treatment plan with the patient. The patient was provided an opportunity to ask questions and all were answered. The patient agreed with the plan and demonstrated an understanding of the instructions.   The patient was advised to call back or seek an in-person evaluation if the symptoms worsen or if the condition fails to improve as anticipated.  I provided 12 minutes of non-face-to-face time during this encounter.   Derek Jack, MD

## 2020-06-19 ENCOUNTER — Encounter (HOSPITAL_COMMUNITY): Payer: Self-pay

## 2020-06-22 ENCOUNTER — Encounter (HOSPITAL_COMMUNITY): Payer: Self-pay

## 2020-06-27 ENCOUNTER — Other Ambulatory Visit (HOSPITAL_COMMUNITY): Payer: Self-pay | Admitting: *Deleted

## 2020-06-28 ENCOUNTER — Encounter (HOSPITAL_COMMUNITY): Payer: Self-pay

## 2020-06-29 ENCOUNTER — Ambulatory Visit: Payer: Self-pay | Admitting: Urology

## 2020-07-03 ENCOUNTER — Ambulatory Visit: Payer: Self-pay | Admitting: Urology

## 2020-07-27 DIAGNOSIS — R232 Flushing: Secondary | ICD-10-CM | POA: Diagnosis not present

## 2020-07-27 DIAGNOSIS — C50411 Malignant neoplasm of upper-outer quadrant of right female breast: Secondary | ICD-10-CM | POA: Diagnosis not present

## 2020-07-27 DIAGNOSIS — Z79810 Long term (current) use of selective estrogen receptor modulators (SERMs): Secondary | ICD-10-CM | POA: Diagnosis not present

## 2020-07-27 DIAGNOSIS — Z7901 Long term (current) use of anticoagulants: Secondary | ICD-10-CM | POA: Diagnosis not present

## 2020-07-27 DIAGNOSIS — R310 Gross hematuria: Secondary | ICD-10-CM | POA: Diagnosis not present

## 2020-07-27 DIAGNOSIS — R109 Unspecified abdominal pain: Secondary | ICD-10-CM | POA: Diagnosis not present

## 2020-07-27 DIAGNOSIS — Z79899 Other long term (current) drug therapy: Secondary | ICD-10-CM | POA: Diagnosis not present

## 2020-07-27 DIAGNOSIS — Z9013 Acquired absence of bilateral breasts and nipples: Secondary | ICD-10-CM | POA: Diagnosis not present

## 2020-07-27 DIAGNOSIS — Z170 Estrogen receptor positive status [ER+]: Secondary | ICD-10-CM | POA: Diagnosis not present

## 2020-07-27 DIAGNOSIS — R319 Hematuria, unspecified: Secondary | ICD-10-CM | POA: Diagnosis not present

## 2020-07-27 DIAGNOSIS — N200 Calculus of kidney: Secondary | ICD-10-CM | POA: Diagnosis not present

## 2020-07-27 DIAGNOSIS — Z7902 Long term (current) use of antithrombotics/antiplatelets: Secondary | ICD-10-CM | POA: Diagnosis not present

## 2020-08-21 DIAGNOSIS — N8110 Cystocele, unspecified: Secondary | ICD-10-CM | POA: Diagnosis not present

## 2020-08-21 DIAGNOSIS — N3946 Mixed incontinence: Secondary | ICD-10-CM | POA: Diagnosis not present

## 2020-08-28 DIAGNOSIS — N3281 Overactive bladder: Secondary | ICD-10-CM | POA: Diagnosis not present

## 2020-08-28 DIAGNOSIS — R350 Frequency of micturition: Secondary | ICD-10-CM | POA: Diagnosis not present

## 2020-09-01 DIAGNOSIS — R0602 Shortness of breath: Secondary | ICD-10-CM | POA: Diagnosis not present

## 2020-09-01 DIAGNOSIS — U071 COVID-19: Secondary | ICD-10-CM | POA: Diagnosis not present

## 2020-09-01 DIAGNOSIS — R051 Acute cough: Secondary | ICD-10-CM | POA: Diagnosis not present

## 2020-09-07 DIAGNOSIS — R051 Acute cough: Secondary | ICD-10-CM | POA: Diagnosis not present

## 2020-09-07 DIAGNOSIS — R0602 Shortness of breath: Secondary | ICD-10-CM | POA: Diagnosis not present

## 2020-09-07 DIAGNOSIS — R5383 Other fatigue: Secondary | ICD-10-CM | POA: Diagnosis not present

## 2020-09-07 DIAGNOSIS — U071 COVID-19: Secondary | ICD-10-CM | POA: Diagnosis not present

## 2020-09-14 DIAGNOSIS — N8111 Cystocele, midline: Secondary | ICD-10-CM | POA: Diagnosis not present

## 2020-09-14 DIAGNOSIS — N393 Stress incontinence (female) (male): Secondary | ICD-10-CM | POA: Diagnosis not present

## 2020-09-29 ENCOUNTER — Emergency Department (HOSPITAL_COMMUNITY)
Admission: EM | Admit: 2020-09-29 | Discharge: 2020-09-29 | Disposition: A | Discharge status: Home / Self Care | Payer: BLUE CROSS/BLUE SHIELD | Attending: Emergency Medicine | Admitting: Emergency Medicine

## 2020-09-29 ENCOUNTER — Emergency Department (HOSPITAL_COMMUNITY): Payer: BLUE CROSS/BLUE SHIELD

## 2020-09-29 ENCOUNTER — Encounter (HOSPITAL_COMMUNITY): Payer: Self-pay

## 2020-09-29 ENCOUNTER — Other Ambulatory Visit: Payer: Self-pay

## 2020-09-29 DIAGNOSIS — R6883 Chills (without fever): Secondary | ICD-10-CM | POA: Insufficient documentation

## 2020-09-29 DIAGNOSIS — Z7982 Long term (current) use of aspirin: Secondary | ICD-10-CM | POA: Insufficient documentation

## 2020-09-29 DIAGNOSIS — Z853 Personal history of malignant neoplasm of breast: Secondary | ICD-10-CM | POA: Diagnosis not present

## 2020-09-29 DIAGNOSIS — F17210 Nicotine dependence, cigarettes, uncomplicated: Secondary | ICD-10-CM | POA: Diagnosis not present

## 2020-09-29 DIAGNOSIS — Z7902 Long term (current) use of antithrombotics/antiplatelets: Secondary | ICD-10-CM | POA: Insufficient documentation

## 2020-09-29 DIAGNOSIS — R5383 Other fatigue: Secondary | ICD-10-CM | POA: Insufficient documentation

## 2020-09-29 DIAGNOSIS — U071 COVID-19: Secondary | ICD-10-CM | POA: Diagnosis not present

## 2020-09-29 DIAGNOSIS — R0602 Shortness of breath: Secondary | ICD-10-CM | POA: Insufficient documentation

## 2020-09-29 DIAGNOSIS — I10 Essential (primary) hypertension: Secondary | ICD-10-CM | POA: Insufficient documentation

## 2020-09-29 DIAGNOSIS — Z7901 Long term (current) use of anticoagulants: Secondary | ICD-10-CM | POA: Diagnosis not present

## 2020-09-29 DIAGNOSIS — R160 Hepatomegaly, not elsewhere classified: Secondary | ICD-10-CM | POA: Diagnosis not present

## 2020-09-29 DIAGNOSIS — R059 Cough, unspecified: Secondary | ICD-10-CM | POA: Diagnosis not present

## 2020-09-29 LAB — CBC WITH DIFFERENTIAL/PLATELET
Abs Immature Granulocytes: 0.04 10*3/uL (ref 0.00–0.07)
Basophils Absolute: 0.1 10*3/uL (ref 0.0–0.1)
Basophils Relative: 1 %
Eosinophils Absolute: 0.5 10*3/uL (ref 0.0–0.5)
Eosinophils Relative: 5 %
HCT: 41.6 % (ref 36.0–46.0)
Hemoglobin: 13.2 g/dL (ref 12.0–15.0)
Immature Granulocytes: 0 %
Lymphocytes Relative: 17 %
Lymphs Abs: 1.7 10*3/uL (ref 0.7–4.0)
MCH: 28 pg (ref 26.0–34.0)
MCHC: 31.7 g/dL (ref 30.0–36.0)
MCV: 88.1 fL (ref 80.0–100.0)
Monocytes Absolute: 0.5 10*3/uL (ref 0.1–1.0)
Monocytes Relative: 5 %
Neutro Abs: 7.1 10*3/uL (ref 1.7–7.7)
Neutrophils Relative %: 72 %
Platelets: 337 10*3/uL (ref 150–400)
RBC: 4.72 MIL/uL (ref 3.87–5.11)
RDW: 13.2 % (ref 11.5–15.5)
WBC: 10 10*3/uL (ref 4.0–10.5)
nRBC: 0 % (ref 0.0–0.2)

## 2020-09-29 LAB — TROPONIN I (HIGH SENSITIVITY)
Troponin I (High Sensitivity): 3 ng/L (ref <18)
Troponin I (High Sensitivity): 4 ng/L (ref <18)

## 2020-09-29 LAB — BASIC METABOLIC PANEL
Anion gap: 11 (ref 5–15)
BUN: 13 mg/dL (ref 6–20)
CO2: 21 mmol/L — ABNORMAL LOW (ref 22–32)
Calcium: 9.3 mg/dL (ref 8.9–10.3)
Chloride: 105 mmol/L (ref 98–111)
Creatinine, Ser: 0.74 mg/dL (ref 0.44–1.00)
GFR, Estimated: >60 mL/min (ref >60)
Glucose, Bld: 125 mg/dL — ABNORMAL HIGH (ref 70–99)
Potassium: 4.5 mmol/L (ref 3.5–5.1)
Sodium: 137 mmol/L (ref 135–145)

## 2020-09-29 LAB — D-DIMER, QUANTITATIVE: D-Dimer, Quant: 0.85 ug/mL-FEU — ABNORMAL HIGH (ref 0.00–0.50)

## 2020-09-29 MED ORDER — IOHEXOL 350 MG/ML SOLN
50.0000 mL | Freq: Once | INTRAVENOUS | Status: AC | PRN
  Administered 2020-09-29: 50 mL via INTRAVENOUS

## 2020-09-29 NOTE — ED Notes (Signed)
Patient to CT.

## 2020-09-29 NOTE — ED Provider Notes (Signed)
MSE was initiated and I personally evaluated the patient and placed orders (if any) at  5:40 AM on Sep 29, 2020.  Patient here with cough and SOB.  Diagnosed with covid a month ago.  Initially felt improved, now with worsening cough.  Reports fever on Tuesday.    ROS: As listed above  PE: Alert and oriented Answers questions appropriately No respiratory distress Coughing  Discussed with patient that their care has been initiated.   They are counseled that they will need to remain in the ED until the completion of their workup, including full H&P and results of any tests.  Risks of leaving the emergency department prior to completion of treatment were discussed. Patient was advised to inform ED staff if they are leaving before their treatment is complete. The patient acknowledged these risks and time was allowed for questions.    The patient appears stable so that the remainder of the MSE may be completed by another provider.    Montine Circle, PA-C 09/29/20 0542    Margette Fast, MD 09/29/20 [phone]

## 2020-09-29 NOTE — ED Provider Notes (Signed)
[facility] EMERGENCY DEPARTMENT Provider Note   CSN: [id number] Arrival date & time: 09/29/20  0530     History No chief complaint on file.   Susan Benton is a 50 y.o. female.  Presented to ER with concern for shortness of breath, fatigue.  Patient reports she was diagnosed with COVID around 1 month ago.  Had initial improvement in symptoms but then over the past few days has felt worse again.  States that she feels generally fatigued, short of breath, worse with exertion.  No chest pain.  Also having some cough, nonproductive.  Chills at home.  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  Otherwise healthy.  HPI     Past Medical History:  Diagnosis Date  . Anxiety   . Arthritis   . Depression 2013  . Fibroids, submucosal 10/04/2016  . Headache   . Hep C w/o coma, chronic ([place]) 06/27/2014   had treatment   . Hx of right and left heart catheterization 2014   had cath in [place], no intervention  . Hypertension 1987  . 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 Low iron   . Restless legs syndrome     Patient Active Problem List   Diagnosis Date Noted  . Acquired absence of breast and absent nipple, left 12/25/2016  . S/P right mastectomy 10/23/2016  . Malignant neoplasm of upper-outer quadrant of right female breast ([place])   . Family history of breast cancer   . Fibroids, submucosal 10/04/2016  . Menstrual cramps 09/25/2016  . Menorrhagia with regular cycle 09/25/2016  . Mass of upper outer quadrant of right breast 09/25/2016  . Depression   . Hypertension     Past Surgical History:  Procedure Laterality Date  . AXILLARY SENTINEL NODE BIOPSY Right 11/20/2016   Procedure: AXILLARY SENTINEL NODE BIOPSY;  Surgeon: Aviva Signs, MD;  Location: [facility];  Service: General;  Laterality: Right;  Sentinel Node Injection @ 10:30am  . BREAST RECONSTRUCTION WITH PLACEMENT OF TISSUE EXPANDER AND FLEX HD (ACELLULAR HYDRATED DERMIS) Bilateral 12/25/2016    Procedure: BREAST RECONSTRUCTION WITH PLACEMENT OF TISSUE EXPANDER AND FLEX HD (ACELLULAR HYDRATED DERMIS);  Surgeon: Wallace Going, DO;  Location: [facility];  Service: Plastics;  Laterality: Bilateral;  . CARDIAC CATHETERIZATION    . DILITATION & CURRETTAGE/HYSTROSCOPY WITH NOVASURE ABLATION N/A 10/23/2016   Procedure: DILATATION & CURETTAGE/HYSTEROSCOPY WITH NOVASURE ENDOMETRIAL ABLATION;  Surgeon: Florian Buff, MD;  Location: [facility];  Service: Gynecology;  Laterality: N/A;  . MASTECTOMY MODIFIED RADICAL Right 10/23/2016   Procedure: MASTECTOMY MODIFIED RADICAL;  Surgeon: Aviva Signs, MD;  Location: [facility];  Service: General;  Laterality: Right;  . SIMPLE MASTECTOMY WITH AXILLARY SENTINEL NODE BIOPSY Left 10/23/2016   Procedure: SIMPLE MASTECTOMY;  Surgeon: Aviva Signs, MD;  Location: [facility];  Service: General;  Laterality: Left;  . TISSUE EXPANDER PLACEMENT Bilateral 02/27/2017   Procedure: REMOVAL TISSUE EXPANDER;  Surgeon: Wallace Going, DO;  Location: [facility];  Service: Plastics;  Laterality: Bilateral;  . TONSILECTOMY, ADENOIDECTOMY, BILATERAL MYRINGOTOMY AND TUBES Bilateral 1984  . TUBAL LIGATION  1998   2000     OB History    Gravida  2   Para  2   Term      Preterm      AB      Living        SAB      IAB      Ectopic  Multiple      Live Births              Family History  Problem Relation Age of Onset  . Hypertension Mother   . Cancer Mother   . Diabetes Father   . Hypertension Father   . Kidney disease Father   . Hyperlipidemia Father   . Asthma Father   . COPD Father   . Heart disease Father   . HIV/AIDS Brother   . Cancer Maternal Grandmother        breast  . Breast cancer Maternal Grandmother   . Hypertension Son     Social History   Tobacco Use  . Smoking status: Current Every Day Smoker    Packs/day: 0.25    Years: 20.00    Pack years: 5.00    Types: Cigarettes    Last attempt to quit: 07/20/2016     Years since quitting: 4.1  . Smokeless tobacco: Never Used  . Tobacco comment: 3-4 per day  Vaping Use  . Vaping Use: Former  Substance Use Topics  . Alcohol use: No  . Drug use: No    Home Medications Prior to Admission medications   Medication Sig Start Date End Date Taking? Authorizing Provider  ALPRAZolam Duanne Moron) 0.5 MG tablet TAKE 1 TABLET BY MOUTH TWICE A DAY AS NEEDED FOR ANXIETY 06/13/17   Holley Bouche, NP  amoxicillin-clavulanate (AUGMENTIN) 875-125 MG tablet Take 1 tablet by mouth 2 (two) times daily. 03/02/20   [provider]  aspirin 81 MG EC tablet Take 81 mg by mouth daily. 12/17/17   [provider]  atenolol (TENORMIN) 100 MG tablet Take 1 tablet (100 mg total) by mouth daily. 11/05/16   Aviva Signs, MD  cephALEXin (KEFLEX) 500 MG capsule Take 1 capsule (500 mg total) by mouth 4 (four) times daily. 05/10/20   Varney Biles, MD  clopidogrel (PLAVIX) 75 MG tablet Take 75 mg by mouth daily. 12/28/18   [provider]  ELIQUIS 5 MG TABS tablet Take 1 tablet (5 mg total) by mouth 2 (two) times daily. 11/12/18   Derek Jack, MD  gabapentin (NEURONTIN) 300 MG capsule TAKE ONE CAPSULE BY MOUTH 3 TIMES A DAY Patient taking differently: Take 300 mg by mouth 3 (three) times daily. 01/06/17   Susy Frizzle, MD  methocarbamol (ROBAXIN) 500 MG tablet Take 1 tablet (500 mg total) by mouth at bedtime as needed for muscle spasms. 03/26/17   Avie Echevaria B, PA-C  olmesartan (BENICAR) 40 MG tablet Take 40 mg by mouth daily. 04/26/20   [provider]  oxyCODONE (ROXICODONE) 15 MG immediate release tablet Take 15 mg by mouth in the morning, at noon, and at bedtime.    [provider]  rOPINIRole (REQUIP) 2 MG tablet 1-2 tabs po qhs Patient taking differently: Take 2-4 mg by mouth at bedtime. 07/11/16   Susy Frizzle, MD  tamoxifen (NOLVADEX) 20 MG tablet TAKE 1 TABLET BY MOUTH EVERYDAY AT BEDTIME Patient taking differently:  Take 20 mg by mouth at bedtime. 02/19/18   Derek Jack, MD    Allergies    Tramadol  Review of Systems   Review of Systems  Constitutional: Positive for chills and fatigue. Negative for fever.  HENT: Negative for ear pain and sore throat.   Eyes: Negative for pain and visual disturbance.  Respiratory: Positive for cough and shortness of breath.   Cardiovascular: Positive for chest pain. Negative for palpitations.  Gastrointestinal: Negative for abdominal  pain and vomiting.  Genitourinary: Negative for dysuria and hematuria.  Musculoskeletal: Negative for arthralgias and back pain.  Skin: Negative for color change and rash.  Neurological: Negative for seizures and syncope.  All other systems reviewed and are negative.   Physical Exam Updated Vital Signs BP (!) 150/80   Pulse 65   Temp 97.6 F (36.4 C)   Resp 14   Ht 5\' 9"  (1.753 m)   Wt 113 kg   SpO2 93%   BMI 36.79 kg/m   Physical Exam Vitals and nursing note reviewed.  Constitutional:      General: She is not in acute distress.    Appearance: She is well-developed.  HENT:     Head: Normocephalic and atraumatic.  Eyes:     Conjunctiva/sclera: Conjunctivae normal.  Cardiovascular:     Rate and Rhythm: Normal rate and regular rhythm.     Heart sounds: No murmur heard.   Pulmonary:     Effort: Pulmonary effort is normal. No respiratory distress.     Breath sounds: Normal breath sounds.  Abdominal:     Palpations: Abdomen is soft.     Tenderness: There is no abdominal tenderness.  Musculoskeletal:        General: No deformity or signs of injury.     Cervical back: Neck supple.  Skin:    General: Skin is warm and dry.  Neurological:     General: No focal deficit present.     Mental Status: She is alert.  Psychiatric:        Mood and Affect: Mood normal.        Behavior: Behavior normal.     ED Results / Procedures / Treatments   Labs (all labs ordered are listed, but only abnormal results are  displayed) Labs Reviewed  BASIC METABOLIC PANEL - Abnormal; Notable for the following components:      Result Value   CO2 21 (*)    Glucose, Bld 125 (*)    All other components within normal limits  D-DIMER, QUANTITATIVE - Abnormal; Notable for the following components:   D-Dimer, Quant 0.85 (*)    All other components within normal limits  CBC WITH DIFFERENTIAL/PLATELET  TROPONIN I (HIGH SENSITIVITY)  TROPONIN I (HIGH SENSITIVITY)    EKG None  Radiology DG Chest 2 View  Result Date: 09/29/2020 CLINICAL DATA:  Cough, COVID positive 08/27/2020 EXAM: CHEST - 2 VIEW COMPARISON:  Radiograph 05/10/2020, CT 06/08/2020 FINDINGS: Rounded density in the medial right lung base is favored to reflect superimposed vessels though appearance is somewhat indeterminate on this exam and certainly new from comparison priors. Lungs are otherwise clear. Normal pulmonary vasculature. The aorta is calcified. The remaining cardiomediastinal contours are unremarkable. No acute osseous or soft tissue abnormality. Surgical clips seen in the right axilla. IMPRESSION: Indeterminate rounded nodule in the right lung base, possible overlapping vessels intersecting with a rib. Consider further evaluation with nonemergent outpatient chest CT. Aortic Atherosclerosis (ICD10-I70.0). Otherwise unremarkable chest radiograph Electronically Signed   By: Lovena Le M.D.   On: 09/29/2020 06:17   CT Angio Chest PE W and/or Wo Contrast  Result Date: 09/29/2020 CLINICAL DATA:  Shortness of breath for a while. Difficulty sleeping. Patient tested positive for COVID-19 on 04/03 2. History of breast cancer and blood clots. EXAM: CT ANGIOGRAPHY CHEST WITH CONTRAST TECHNIQUE: Multidetector CT imaging of the chest was performed using the standard protocol during bolus administration of intravenous contrast. Multiplanar CT image reconstructions and MIPs were obtained to evaluate  the vascular anatomy. CONTRAST:  62mL OMNIPAQUE IOHEXOL 350 MG/ML  SOLN COMPARISON:  06/08/2020, CT of the chest, abdomen and pelvis. FINDINGS: Cardiovascular: Pulmonary arteries are satisfactorily opacified. There is no evidence of a pulmonary embolism. Heart is normal in size and configuration. No pericardial effusion. Three-vessel coronary artery calcifications. Great vessels are normal in caliber. No aortic dissection. Mild aortic atherosclerosis. Mediastinum/Nodes: No enlarged mediastinal, hilar, or axillary lymph nodes. Thyroid gland, trachea, and esophagus demonstrate no significant findings. Lungs/Pleura: Clear lungs. Mild areas of bronchial wall thickening bilaterally. No pleural effusion or pneumothorax. Upper Abdomen: Liver mildly enlarged and diffusely decreased in attenuation consistent with fatty infiltration. No acute findings in the visualized upper abdomen. Musculoskeletal: No chest wall abnormality. No acute or significant osseous findings. Review of the MIP images confirms the above findings. IMPRESSION: 1. No evidence of a pulmonary embolism. 2. There are areas of bronchial wall thickening in both lungs consistent with bronchial inflammation, which may be acute or chronic or a combination. 3. No other evidence of an acute abnormality.  Lungs are clear. 4. Aortic atherosclerosis and diffuse hepatic steatosis and mild hepatomegaly. Aortic Atherosclerosis (ICD10-I70.0). Electronically Signed   By: Lajean Manes M.D.   On: 09/29/2020 15:48    Procedures Procedures   Medications Ordered in ED Medications  iohexol (OMNIPAQUE) 350 MG/ML injection 50 mL (50 mLs Intravenous Contrast Given 09/29/20 1454)    ED Course  I have reviewed the triage vital signs and the nursing notes.  Pertinent labs & imaging results that were available during my care of the patient were reviewed by me and considered in my medical decision making (see chart for details).    MDM Rules/Calculators/A&P                         For fever lady presented to ER with concern for  worsening shortness of breath and fatigue in setting of relatively recent COVID infection.  On physical exam, patient noted to be well-appearing in no distress.  CXR negative for acute process.  Discussed incidental finding of nodule on right lung base and need for outpatient CT scan.  EKG unremarkable, troponin within normal limits, doubt ACS.  D-dimer was mildly elevated, will check CTA chest to rule out PE.  While awaiting CT, signed out to [facility]. Anticipate dc if negative.   Final Clinical Impression(s) / ED Diagnoses Final diagnoses:  SOB (shortness of breath)    Rx / DC Orders ED Discharge Orders    None       Lucrezia Starch, MD 09/29/20 2134

## 2020-09-29 NOTE — ED Notes (Signed)
Pt refused d/c vitals.

## 2020-09-29 NOTE — ED Notes (Signed)
Patient refusing d/c vitals at this time. Pt stated she had a family emergency and need to leave ASAP.

## 2020-09-29 NOTE — ED Provider Notes (Signed)
Pt care assumed at 1530. Patient with recent COVID-19 infection in early April here for evaluation of worsening shortness of breath and dyspnea on exertion. CTA pending.  CTA is negative for PE, does show some changes consistent with bronchitis. Presentation is not consistent with acute bacterial infection. Plan to discharge home with outpatient follow-up and return precautions.   Quintella Reichert, MD 09/29/20 [phone]

## 2020-09-29 NOTE — ED Triage Notes (Signed)
COVID positive on April 3rd.   Couldn't sleep and having shortness of breath for a while now to the point that she has to come to the ED. Pt feels she couldn't take a deep breath.

## 2020-10-05 DIAGNOSIS — N8111 Cystocele, midline: Secondary | ICD-10-CM | POA: Diagnosis not present

## 2020-10-05 DIAGNOSIS — N393 Stress incontinence (female) (male): Secondary | ICD-10-CM | POA: Diagnosis not present

## 2020-10-05 DIAGNOSIS — Z09 Encounter for follow-up examination after completed treatment for conditions other than malignant neoplasm: Secondary | ICD-10-CM | POA: Diagnosis not present

## 2020-10-11 DIAGNOSIS — G894 Chronic pain syndrome: Secondary | ICD-10-CM | POA: Diagnosis not present

## 2020-10-11 DIAGNOSIS — F331 Major depressive disorder, recurrent, moderate: Secondary | ICD-10-CM | POA: Diagnosis not present

## 2020-10-11 DIAGNOSIS — I10 Essential (primary) hypertension: Secondary | ICD-10-CM | POA: Diagnosis not present

## 2020-10-11 DIAGNOSIS — C50411 Malignant neoplasm of upper-outer quadrant of right female breast: Secondary | ICD-10-CM | POA: Diagnosis not present

## 2020-10-11 DIAGNOSIS — Z716 Tobacco abuse counseling: Secondary | ICD-10-CM | POA: Diagnosis not present

## 2020-10-11 DIAGNOSIS — F17200 Nicotine dependence, unspecified, uncomplicated: Secondary | ICD-10-CM | POA: Diagnosis not present

## 2020-11-05 DIAGNOSIS — Z111 Encounter for screening for respiratory tuberculosis: Secondary | ICD-10-CM | POA: Diagnosis not present

## 2020-12-11 ENCOUNTER — Inpatient Hospital Stay (HOSPITAL_COMMUNITY): Payer: BLUE CROSS/BLUE SHIELD | Attending: Hematology

## 2020-12-18 ENCOUNTER — Ambulatory Visit (HOSPITAL_COMMUNITY): Payer: Self-pay | Admitting: Hematology

## 2020-12-29 DIAGNOSIS — N200 Calculus of kidney: Secondary | ICD-10-CM | POA: Diagnosis not present

## 2020-12-29 DIAGNOSIS — K760 Fatty (change of) liver, not elsewhere classified: Secondary | ICD-10-CM | POA: Diagnosis not present

## 2020-12-29 DIAGNOSIS — R160 Hepatomegaly, not elsewhere classified: Secondary | ICD-10-CM | POA: Diagnosis not present

## 2021-01-17 DIAGNOSIS — R319 Hematuria, unspecified: Secondary | ICD-10-CM | POA: Diagnosis not present

## 2021-01-17 DIAGNOSIS — Z8742 Personal history of other diseases of the female genital tract: Secondary | ICD-10-CM | POA: Diagnosis not present

## 2021-01-17 DIAGNOSIS — N200 Calculus of kidney: Secondary | ICD-10-CM | POA: Diagnosis not present

## 2021-01-17 DIAGNOSIS — R310 Gross hematuria: Secondary | ICD-10-CM | POA: Diagnosis not present

## 2021-01-17 DIAGNOSIS — N390 Urinary tract infection, site not specified: Secondary | ICD-10-CM | POA: Diagnosis not present

## 2021-02-07 DIAGNOSIS — L600 Ingrowing nail: Secondary | ICD-10-CM | POA: Diagnosis not present

## 2021-02-07 DIAGNOSIS — L603 Nail dystrophy: Secondary | ICD-10-CM | POA: Diagnosis not present

## 2021-02-09 DIAGNOSIS — M75101 Unspecified rotator cuff tear or rupture of right shoulder, not specified as traumatic: Secondary | ICD-10-CM | POA: Diagnosis not present

## 2021-02-28 DIAGNOSIS — R7301 Impaired fasting glucose: Secondary | ICD-10-CM | POA: Diagnosis not present

## 2021-03-02 DIAGNOSIS — M25511 Pain in right shoulder: Secondary | ICD-10-CM | POA: Diagnosis not present

## 2021-03-21 DIAGNOSIS — M25511 Pain in right shoulder: Secondary | ICD-10-CM | POA: Diagnosis not present

## 2021-03-23 ENCOUNTER — Telehealth: Payer: Self-pay | Admitting: Hematology

## 2021-03-23 NOTE — Telephone Encounter (Signed)
Scheduled appt per 10/27 referral. Pt is aware of appt date and time. She also said she is active in Mychart so she will see all the information in there as well.

## 2021-03-26 DIAGNOSIS — M25511 Pain in right shoulder: Secondary | ICD-10-CM | POA: Diagnosis not present

## 2021-03-30 DIAGNOSIS — D72829 Elevated white blood cell count, unspecified: Secondary | ICD-10-CM | POA: Diagnosis not present

## 2021-03-30 DIAGNOSIS — N390 Urinary tract infection, site not specified: Secondary | ICD-10-CM | POA: Diagnosis not present

## 2021-04-03 ENCOUNTER — Other Ambulatory Visit: Payer: Self-pay | Admitting: Urology

## 2021-04-03 ENCOUNTER — Ambulatory Visit
Admission: RE | Admit: 2021-04-03 | Discharge: 2021-04-03 | Disposition: A | Discharge status: Home / Self Care | Payer: Self-pay | Source: Ambulatory Visit | Attending: Urology | Admitting: Urology

## 2021-04-03 ENCOUNTER — Other Ambulatory Visit: Payer: Self-pay

## 2021-04-03 DIAGNOSIS — R310 Gross hematuria: Secondary | ICD-10-CM

## 2021-04-03 DIAGNOSIS — R319 Hematuria, unspecified: Secondary | ICD-10-CM

## 2021-04-03 DIAGNOSIS — R109 Unspecified abdominal pain: Secondary | ICD-10-CM | POA: Diagnosis not present

## 2021-04-09 DIAGNOSIS — R109 Unspecified abdominal pain: Secondary | ICD-10-CM | POA: Diagnosis not present

## 2021-04-09 DIAGNOSIS — R310 Gross hematuria: Secondary | ICD-10-CM | POA: Diagnosis not present

## 2021-04-09 DIAGNOSIS — N390 Urinary tract infection, site not specified: Secondary | ICD-10-CM | POA: Diagnosis not present

## 2021-04-09 DIAGNOSIS — N200 Calculus of kidney: Secondary | ICD-10-CM | POA: Diagnosis not present

## 2021-04-09 DIAGNOSIS — Z87448 Personal history of other diseases of urinary system: Secondary | ICD-10-CM | POA: Diagnosis not present

## 2021-04-11 ENCOUNTER — Inpatient Hospital Stay: Payer: BC Managed Care – PPO | Attending: Hematology | Admitting: Hematology

## 2021-04-11 DIAGNOSIS — C50411 Malignant neoplasm of upper-outer quadrant of right female breast: Secondary | ICD-10-CM

## 2021-04-16 ENCOUNTER — Telehealth: Payer: Self-pay | Admitting: Hematology

## 2021-04-16 NOTE — Telephone Encounter (Signed)
R/s pt's new pt appt. Spoke to pt, she told me she had multiple surgeries coming up so she would have to r/s for January. I r/s pt for first available in January, she is aware of new appt date and time.

## 2021-04-19 ENCOUNTER — Other Ambulatory Visit: Payer: Self-pay

## 2021-04-19 ENCOUNTER — Emergency Department (HOSPITAL_BASED_OUTPATIENT_CLINIC_OR_DEPARTMENT_OTHER): Payer: BC Managed Care – PPO

## 2021-04-19 ENCOUNTER — Emergency Department (HOSPITAL_BASED_OUTPATIENT_CLINIC_OR_DEPARTMENT_OTHER)
Admission: EM | Admit: 2021-04-19 | Discharge: 2021-04-19 | Disposition: A | Discharge status: Home / Self Care | Payer: BC Managed Care – PPO | Source: Home / Self Care | Attending: Emergency Medicine | Admitting: Emergency Medicine

## 2021-04-19 ENCOUNTER — Encounter (HOSPITAL_BASED_OUTPATIENT_CLINIC_OR_DEPARTMENT_OTHER): Payer: Self-pay | Admitting: Obstetrics and Gynecology

## 2021-04-19 DIAGNOSIS — Z853 Personal history of malignant neoplasm of breast: Secondary | ICD-10-CM | POA: Diagnosis not present

## 2021-04-19 DIAGNOSIS — Z803 Family history of malignant neoplasm of breast: Secondary | ICD-10-CM | POA: Diagnosis not present

## 2021-04-19 DIAGNOSIS — Z841 Family history of disorders of kidney and ureter: Secondary | ICD-10-CM | POA: Diagnosis not present

## 2021-04-19 DIAGNOSIS — Z7901 Long term (current) use of anticoagulants: Secondary | ICD-10-CM | POA: Insufficient documentation

## 2021-04-19 DIAGNOSIS — N920 Excessive and frequent menstruation with regular cycle: Secondary | ICD-10-CM | POA: Diagnosis not present

## 2021-04-19 DIAGNOSIS — Z8249 Family history of ischemic heart disease and other diseases of the circulatory system: Secondary | ICD-10-CM | POA: Diagnosis not present

## 2021-04-19 DIAGNOSIS — I10 Essential (primary) hypertension: Secondary | ICD-10-CM | POA: Insufficient documentation

## 2021-04-19 DIAGNOSIS — Z95828 Presence of other vascular implants and grafts: Secondary | ICD-10-CM | POA: Diagnosis not present

## 2021-04-19 DIAGNOSIS — Z825 Family history of asthma and other chronic lower respiratory diseases: Secondary | ICD-10-CM | POA: Diagnosis not present

## 2021-04-19 DIAGNOSIS — Z79899 Other long term (current) drug therapy: Secondary | ICD-10-CM | POA: Insufficient documentation

## 2021-04-19 DIAGNOSIS — F17210 Nicotine dependence, cigarettes, uncomplicated: Secondary | ICD-10-CM | POA: Diagnosis not present

## 2021-04-19 DIAGNOSIS — Z833 Family history of diabetes mellitus: Secondary | ICD-10-CM | POA: Diagnosis not present

## 2021-04-19 DIAGNOSIS — K760 Fatty (change of) liver, not elsewhere classified: Secondary | ICD-10-CM | POA: Diagnosis not present

## 2021-04-19 DIAGNOSIS — N390 Urinary tract infection, site not specified: Secondary | ICD-10-CM | POA: Diagnosis not present

## 2021-04-19 DIAGNOSIS — Z86718 Personal history of other venous thrombosis and embolism: Secondary | ICD-10-CM | POA: Diagnosis not present

## 2021-04-19 DIAGNOSIS — Z20822 Contact with and (suspected) exposure to covid-19: Secondary | ICD-10-CM | POA: Diagnosis not present

## 2021-04-19 DIAGNOSIS — Z7982 Long term (current) use of aspirin: Secondary | ICD-10-CM | POA: Insufficient documentation

## 2021-04-19 DIAGNOSIS — Z7902 Long term (current) use of antithrombotics/antiplatelets: Secondary | ICD-10-CM | POA: Diagnosis not present

## 2021-04-19 DIAGNOSIS — N12 Tubulo-interstitial nephritis, not specified as acute or chronic: Secondary | ICD-10-CM | POA: Insufficient documentation

## 2021-04-19 DIAGNOSIS — G2581 Restless legs syndrome: Secondary | ICD-10-CM | POA: Diagnosis not present

## 2021-04-19 DIAGNOSIS — B961 Klebsiella pneumoniae [K. pneumoniae] as the cause of diseases classified elsewhere: Secondary | ICD-10-CM | POA: Diagnosis not present

## 2021-04-19 DIAGNOSIS — Z9013 Acquired absence of bilateral breasts and nipples: Secondary | ICD-10-CM | POA: Diagnosis not present

## 2021-04-19 DIAGNOSIS — Z83438 Family history of other disorder of lipoprotein metabolism and other lipidemia: Secondary | ICD-10-CM | POA: Diagnosis not present

## 2021-04-19 DIAGNOSIS — Z830 Family history of human immunodeficiency virus [HIV] disease: Secondary | ICD-10-CM | POA: Diagnosis not present

## 2021-04-19 DIAGNOSIS — F419 Anxiety disorder, unspecified: Secondary | ICD-10-CM | POA: Diagnosis not present

## 2021-04-19 DIAGNOSIS — R1032 Left lower quadrant pain: Secondary | ICD-10-CM | POA: Diagnosis not present

## 2021-04-19 DIAGNOSIS — Z87440 Personal history of urinary (tract) infections: Secondary | ICD-10-CM | POA: Diagnosis not present

## 2021-04-19 DIAGNOSIS — N200 Calculus of kidney: Secondary | ICD-10-CM | POA: Diagnosis not present

## 2021-04-19 DIAGNOSIS — R109 Unspecified abdominal pain: Secondary | ICD-10-CM | POA: Diagnosis not present

## 2021-04-19 DIAGNOSIS — F32A Depression, unspecified: Secondary | ICD-10-CM | POA: Diagnosis not present

## 2021-04-19 LAB — URINALYSIS, ROUTINE W REFLEX MICROSCOPIC
Bilirubin Urine: NEGATIVE
Glucose, UA: NEGATIVE mg/dL
Ketones, ur: NEGATIVE mg/dL
Nitrite: POSITIVE — AB
Protein, ur: 30 mg/dL — AB
RBC / HPF: >50 RBC/hpf — ABNORMAL HIGH (ref 0–5)
Specific Gravity, Urine: 1.028 (ref 1.005–1.030)
WBC, UA: >50 WBC/hpf — ABNORMAL HIGH (ref 0–5)
pH: 6 (ref 5.0–8.0)

## 2021-04-19 LAB — CBC WITH DIFFERENTIAL/PLATELET
Abs Immature Granulocytes: 0.04 10*3/uL (ref 0.00–0.07)
Basophils Absolute: 0.1 10*3/uL (ref 0.0–0.1)
Basophils Relative: 1 %
Eosinophils Absolute: 0.4 10*3/uL (ref 0.0–0.5)
Eosinophils Relative: 3 %
HCT: 39.2 % (ref 36.0–46.0)
Hemoglobin: 12.9 g/dL (ref 12.0–15.0)
Immature Granulocytes: 0 %
Lymphocytes Relative: 22 %
Lymphs Abs: 2.9 10*3/uL (ref 0.7–4.0)
MCH: 27.8 pg (ref 26.0–34.0)
MCHC: 32.9 g/dL (ref 30.0–36.0)
MCV: 84.5 fL (ref 80.0–100.0)
Monocytes Absolute: 0.7 10*3/uL (ref 0.1–1.0)
Monocytes Relative: 6 %
Neutro Abs: 8.9 10*3/uL — ABNORMAL HIGH (ref 1.7–7.7)
Neutrophils Relative %: 68 %
Platelets: 377 10*3/uL (ref 150–400)
RBC: 4.64 MIL/uL (ref 3.87–5.11)
RDW: 13 % (ref 11.5–15.5)
WBC: 13 10*3/uL — ABNORMAL HIGH (ref 4.0–10.5)
nRBC: 0 % (ref 0.0–0.2)

## 2021-04-19 LAB — BASIC METABOLIC PANEL
Anion gap: 13 (ref 5–15)
BUN: 22 mg/dL — ABNORMAL HIGH (ref 6–20)
CO2: 22 mmol/L (ref 22–32)
Calcium: 9.9 mg/dL (ref 8.9–10.3)
Chloride: 105 mmol/L (ref 98–111)
Creatinine, Ser: 0.97 mg/dL (ref 0.44–1.00)
GFR, Estimated: >60 mL/min (ref >60)
Glucose, Bld: 153 mg/dL — ABNORMAL HIGH (ref 70–99)
Potassium: 4.3 mmol/L (ref 3.5–5.1)
Sodium: 140 mmol/L (ref 135–145)

## 2021-04-19 MED ORDER — ONDANSETRON HCL 4 MG/2ML IJ SOLN
4.0000 mg | Freq: Once | INTRAMUSCULAR | Status: AC
  Administered 2021-04-19: 4 mg via INTRAVENOUS
  Filled 2021-04-19: qty 2

## 2021-04-19 MED ORDER — CEFPODOXIME PROXETIL 200 MG PO TABS: 200.0000 mg | ORAL_TABLET | Freq: Two times a day (BID) | ORAL | 0 refills | Status: DC

## 2021-04-19 MED ORDER — FENTANYL CITRATE PF 50 MCG/ML IJ SOSY
50.0000 ug | PREFILLED_SYRINGE | Freq: Once | INTRAMUSCULAR | Status: AC
  Administered 2021-04-19: 50 ug via INTRAVENOUS
  Filled 2021-04-19: qty 1

## 2021-04-19 MED ORDER — SODIUM CHLORIDE 0.9 % IV SOLN
1.0000 g | Freq: Once | INTRAVENOUS | Status: AC
  Administered 2021-04-19: 1 g via INTRAVENOUS
  Filled 2021-04-19: qty 10

## 2021-04-19 MED ORDER — KETOROLAC TROMETHAMINE 30 MG/ML IJ SOLN
30.0000 mg | Freq: Once | INTRAMUSCULAR | Status: AC
  Administered 2021-04-19: 30 mg via INTRAVENOUS
  Filled 2021-04-19: qty 1

## 2021-04-19 MED ORDER — FLUCONAZOLE 150 MG PO TABS: 150.0000 mg | ORAL_TABLET | Freq: Once | ORAL | 0 refills | Status: AC

## 2021-04-19 NOTE — Discharge Instructions (Addendum)
If you begin running a high fever (101F or greater), uncontrolled vomiting or any other concerns, please return to the ER.

## 2021-04-19 NOTE — ED Triage Notes (Signed)
Patient has a known left sided kidney stone that is too big to pass and is scheduled for surgery Dec. 16. Patient reports increased severe pain

## 2021-04-19 NOTE — ED Notes (Signed)
Attempted IV stick x1 without success. Blood work drawn for possible blood work if ordered.

## 2021-04-19 NOTE — ED Provider Notes (Signed)
[facility] Provider Note  CSN: [id number] Arrival date & time: 04/19/21 1856    History Chief Complaint  Patient presents with   Flank Pain    Keeleigh Delrosario is a 50 y.o. female with a known large L renal stone was not previously causing any pain and scheduled for surgical removal in about 3 weeks reports intermittent severe L flank pain for the last 3 days, worse at night, associated with some nausea, radiates to lower abdomen and associated with urinary frequency. She also has recurrent UTI, just finished Abx but still feels like she has an infection.    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/2016   Low iron    Restless legs syndrome     Past Surgical History:  Procedure Laterality Date   AXILLARY SENTINEL NODE BIOPSY Right 11/20/2016   Procedure: AXILLARY SENTINEL NODE BIOPSY;  Surgeon: Aviva Signs, MD;  Location: [facility];  Service: General;  Laterality: Right;  Sentinel Node Injection @ 10:30am   BREAST RECONSTRUCTION WITH PLACEMENT OF TISSUE EXPANDER AND FLEX HD (ACELLULAR HYDRATED DERMIS) Bilateral 12/25/2016   Procedure: BREAST RECONSTRUCTION WITH PLACEMENT OF TISSUE EXPANDER AND FLEX HD (ACELLULAR HYDRATED DERMIS);  Surgeon: Wallace Going, DO;  Location: [facility];  Service: Plastics;  Laterality: Bilateral;   CARDIAC CATHETERIZATION     DILITATION & CURRETTAGE/HYSTROSCOPY WITH NOVASURE ABLATION N/A 10/23/2016   Procedure: DILATATION & CURETTAGE/HYSTEROSCOPY WITH NOVASURE ENDOMETRIAL ABLATION;  Surgeon: Florian Buff, MD;  Location: [facility];  Service: Gynecology;  Laterality: N/A;   MASTECTOMY MODIFIED RADICAL  Right 10/23/2016   Procedure: MASTECTOMY MODIFIED RADICAL;  Surgeon: Aviva Signs, MD;  Location: [facility];  Service: General;  Laterality: Right;   SIMPLE MASTECTOMY WITH AXILLARY SENTINEL NODE BIOPSY Left 10/23/2016   Procedure: SIMPLE MASTECTOMY;  Surgeon: Aviva Signs, MD;  Location: [facility];  Service: General;  Laterality: Left;   TISSUE EXPANDER PLACEMENT Bilateral 02/27/2017   Procedure: REMOVAL TISSUE EXPANDER;  Surgeon: Wallace Going, DO;  Location: [facility];  Service: Plastics;  Laterality: Bilateral;   TONSILECTOMY, ADENOIDECTOMY, BILATERAL MYRINGOTOMY AND TUBES Bilateral 1984   TUBAL LIGATION  1998   2000    Family History  Problem Relation Age of Onset   Hypertension Mother    Cancer Mother    Diabetes Father    Hypertension Father    Kidney disease Father    Hyperlipidemia Father    Asthma Father    COPD Father    Heart disease Father    HIV/AIDS Brother    Cancer Maternal Grandmother        breast   Breast cancer Maternal Grandmother    Hypertension Son     Social History   Tobacco Use   Smoking status: Every Day    Packs/day: 0.25    Years: 20.00    Pack years: 5.00    Types: Cigarettes    Last attempt to quit: 07/20/2016    Years since quitting: 4.7   Smokeless tobacco: Never   Tobacco comments:    3-4 per day  Vaping Use   Vaping Use: Former  Substance Use Topics   Alcohol use: No   Drug use: No     Home Medications Prior to Admission medications   Medication Sig Start Date End Date Taking? Authorizing Provider  cefpodoxime (VANTIN) 200 MG tablet Take 1 tablet (200 mg total) by mouth 2 (two) times daily. 04/19/21  Yes Truddie Hidden, MD  ALPRAZolam Duanne Moron) 0.5 MG tablet TAKE 1 TABLET BY MOUTH TWICE A DAY AS NEEDED FOR ANXIETY 06/13/17   Holley Bouche, NP  aspirin 81 MG EC tablet Take 81 mg by mouth daily. 12/17/17   [provider]  atenolol (TENORMIN) 100 MG tablet Take 1 tablet (100 mg total) by mouth daily. 11/05/16   Aviva Signs, MD  clopidogrel (PLAVIX) 75 MG tablet Take 75 mg by mouth daily. 12/28/18   [provider]  ELIQUIS 5 MG TABS tablet Take 1 tablet (5 mg total) by mouth 2 (two) times daily. 11/12/18   Derek Jack, MD  gabapentin (NEURONTIN) 300 MG capsule TAKE ONE CAPSULE BY MOUTH 3 TIMES A DAY Patient taking differently: Take 300 mg by mouth 3 (three) times daily. 01/06/17   Susy Frizzle, MD  methocarbamol (ROBAXIN) 500 MG tablet Take 1 tablet (500 mg total) by mouth at bedtime as needed for muscle spasms. 03/26/17   Avie Echevaria B, PA-C  olmesartan (BENICAR) 40 MG tablet Take 40 mg by mouth daily. 04/26/20   [provider]  oxyCODONE (ROXICODONE) 15 MG immediate release tablet Take 15 mg by mouth in the morning, at noon, and at bedtime.    [provider]  rOPINIRole (REQUIP) 2 MG tablet 1-2 tabs po qhs Patient taking differently: Take 2-4 mg by mouth at bedtime. 07/11/16   Susy Frizzle, MD  tamoxifen (NOLVADEX) 20 MG tablet TAKE 1 TABLET BY MOUTH EVERYDAY AT BEDTIME Patient taking differently: Take 20 mg by mouth at bedtime. 02/19/18   Derek Jack, MD     Allergies    Tramadol   Review of Systems   Review of Systems A comprehensive review of systems was completed and negative except as noted in HPI.    Physical Exam BP (!) 178/89   Pulse 77   Temp 98.4 F (36.9 C)   Resp 18   SpO2 98%   Physical Exam Vitals and nursing note reviewed.  Constitutional:      Appearance: Normal appearance.  HENT:     Head: Normocephalic and atraumatic.     Nose: Nose normal.     Mouth/Throat:     Mouth: Mucous membranes are moist.  Eyes:     Extraocular Movements: Extraocular movements intact.     Conjunctiva/sclera: Conjunctivae normal.  Cardiovascular:     Rate and Rhythm: Normal rate.  Pulmonary:     Effort: Pulmonary effort is normal.     Breath sounds: Normal breath sounds.  Abdominal:     General: Abdomen is flat.     Palpations:  Abdomen is soft.     Tenderness: There is no abdominal tenderness. There is no guarding.  Genitourinary:    Comments: Tender over L CVA Musculoskeletal:        General: No swelling. Normal range of motion.     Cervical back: Neck supple.  Skin:    General: Skin is warm and dry.  Neurological:     General: No focal deficit present.     Mental Status: She is alert.  Psychiatric:        Mood and Affect: Mood normal.  ED Results / Procedures / Treatments   Labs (all labs ordered are listed, but only abnormal results are displayed) Labs Reviewed  BASIC METABOLIC PANEL - Abnormal; Notable for the following components:      Result Value   Glucose, Bld 153 (*)    BUN 22 (*)    All other components within normal limits  CBC WITH DIFFERENTIAL/PLATELET - Abnormal; Notable for the following components:   WBC 13.0 (*)    Neutro Abs 8.9 (*)    All other components within normal limits  URINALYSIS, ROUTINE W REFLEX MICROSCOPIC - Abnormal; Notable for the following components:   APPearance HAZY (*)    Hgb urine dipstick LARGE (*)    Protein, ur 30 (*)    Nitrite POSITIVE (*)    Leukocytes,Ua LARGE (*)    RBC / HPF >50 (*)    WBC, UA >50 (*)    Bacteria, UA MANY (*)    All other components within normal limits  URINE CULTURE    EKG None   Radiology CT Renal Stone Study  Result Date: 04/19/2021 CLINICAL DATA:  Flank pain. Large stone on the left. Schedule surgery as cannot passed stone on 05/11/2021. Patient has increased severe pain EXAM: CT ABDOMEN AND PELVIS WITHOUT CONTRAST TECHNIQUE: Multidetector CT imaging of the abdomen and pelvis was performed following the standard protocol without IV contrast. COMPARISON:  None. FINDINGS: Lower chest: No acute abnormality.  Coronary artery calcifications. Hepatobiliary: The liver is enlarged measuring up to 22 cm. The hepatic parenchyma is diffusely hypodense compared to the splenic parenchyma consistent with fatty infiltration. No  focal liver abnormality. Pericholecystic focal fatty sparing. Contracted gallbladder. No gallstones, gallbladder wall thickening, or pericholecystic fluid. No biliary dilatation. Pancreas: No focal lesion. Normal pancreatic contour. No surrounding inflammatory changes. No main pancreatic ductal dilatation. Spleen: Normal in size without focal abnormality. Adrenals/Urinary Tract: No adrenal nodule bilaterally. At least 2 cm left staghorn calculi. Several other smaller left renal stones noted. Query amorphous 4 mm calcified stone within the right kidney. No hydronephrosis. No definite contour-deforming renal mass. Scarred and atrophic left kidney. No ureterolithiasis or hydroureter. The urinary bladder is unremarkable. Stomach/Bowel: Stomach is within normal limits. No evidence of bowel wall thickening or dilatation. Appendix appears normal. Vascular/Lymphatic: SMA origin stent with limited evaluation this noncontrast study no abdominal aorta or iliac aneurysm. Severe atherosclerotic plaque of the aorta and its branches. No abdominal, pelvic, or inguinal lymphadenopathy. Reproductive: There is a 3 x 2.1 cm right ovarian cystic lesion. No follow-up imaging recommended. Note: This recommendation does not apply to premenarchal patients and to those with increased risk (genetic, family history, elevated tumor markers or other high-risk factors) of ovarian cancer. Reference: JACR 2020 Feb; 17(2):248-254. Otherwise uterus and bilateral adnexa are unremarkable. Other: No intraperitoneal free fluid. No intraperitoneal free gas. No organized fluid collection. Musculoskeletal: No abdominal wall hernia or abnormality. No suspicious lytic or blastic osseous lesions. No acute displaced fracture. Multilevel degenerative changes of the spine. IMPRESSION: 1. Nonobstructive 2 cm left staghorn calculus. Other nonobstructive smaller left nephrolithiasis. Query post 4 mm right nephrolithiasis. No definite findings to suggest superimposed  infection; however, limited evaluation on this noncontrast study. 2. Hepatomegaly and hepatic steatosis. 3.  Aortic Atherosclerosis (ICD10-I70.0). Electronically Signed   By: Iven Finn M.D.   On: 04/19/2021 20:16    Procedures Procedures  Medications Ordered in the ED Medications  cefTRIAXone (ROCEPHIN) 1 g in sodium chloride 0.9 % 100 mL IVPB (1 g Intravenous New Bag/Given 04/19/21  2054)  ketorolac (TORADOL) 30 MG/ML injection 30 mg (has no administration in time range)  fentaNYL (SUBLIMAZE) injection 50 mcg (has no administration in time range)  fentaNYL (SUBLIMAZE) injection 50 mcg (50 mcg Intravenous Given 04/19/21 1948)  ondansetron (ZOFRAN) injection 4 mg (4 mg Intravenous Given 04/19/21 1946)     MDM Rules/Calculators/A&P MDM [place] is not available at this time, will check labs and CT for signs of ureteral migration of her stone. Pain/nausea meds. 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 Scheduled to stop 3 days prior to her surgery.   ED Course  I have reviewed the triage vital signs and the nursing notes.  Pertinent labs & imaging results that were available during my care of the patient were reviewed by me and considered in my medical decision making (see chart for details).  Clinical Course as of 04/19/21 2121  Thu Apr 19, 2021  1947 CBC with mild leukocytosis.  [CS]  1958 UA with signs of infection and hematuria. Prior culture was positive for klebsiella. BMP is unremarkable.  [CS]  2034 Patient with recurrent UTI now likely early pyelonephritis with known klebsiella has not responded well to Keflex due to known large stone. Will begin IV Rocephin. Patient prefers to continue management with Dr. Amalia Hailey, Urology with [facility] and is amenable to transfer there if they have capacity. Pain is improved at this time.  [CS]  2116 Discussed the case with Dr. Levy Sjogren, Urology at [facility] [place]. He does not feel the patient need admission to urology service as  they would not do a procedure during an active infection. He would recommend Vantin if patient is able to go home, otherwise a local medical admission. I discussed this with the patient, offered admission for pain control and IV abx but she would prefer to go home. She has Oxycodone at home she takes for chronic pain. Will d/c with Rx for Vantin, recommend she return if she develops a fever, intractable vomiting or any other concerns.  [CS]    Clinical Course User Index [CS] Truddie Hidden, MD    Final Clinical Impression(s) / ED Diagnoses Final diagnoses:  Pyelonephritis    Rx / DC Orders ED Discharge Orders          Ordered    cefpodoxime (VANTIN) 200 MG tablet  2 times daily        04/19/21 2120             Truddie Hidden, MD 04/19/21 2121

## 2021-04-20 ENCOUNTER — Other Ambulatory Visit: Payer: Self-pay

## 2021-04-20 ENCOUNTER — Emergency Department (HOSPITAL_BASED_OUTPATIENT_CLINIC_OR_DEPARTMENT_OTHER)
Admission: EM | Admit: 2021-04-20 | Discharge: 2021-04-20 | Discharge status: Against Medical Advice | Payer: BC Managed Care – PPO

## 2021-04-20 ENCOUNTER — Encounter (HOSPITAL_COMMUNITY): Payer: Self-pay

## 2021-04-20 ENCOUNTER — Inpatient Hospital Stay (HOSPITAL_COMMUNITY)
Admission: EM | Admit: 2021-04-20 | Discharge: 2021-04-23 | DRG: 690 | Disposition: A | Discharge status: Home / Self Care | Payer: BC Managed Care – PPO | Attending: Internal Medicine | Admitting: Internal Medicine

## 2021-04-20 DIAGNOSIS — Z7982 Long term (current) use of aspirin: Secondary | ICD-10-CM | POA: Diagnosis not present

## 2021-04-20 DIAGNOSIS — I10 Essential (primary) hypertension: Secondary | ICD-10-CM | POA: Diagnosis present

## 2021-04-20 DIAGNOSIS — Z9013 Acquired absence of bilateral breasts and nipples: Secondary | ICD-10-CM | POA: Diagnosis not present

## 2021-04-20 DIAGNOSIS — F17210 Nicotine dependence, cigarettes, uncomplicated: Secondary | ICD-10-CM | POA: Diagnosis present

## 2021-04-20 DIAGNOSIS — Z853 Personal history of malignant neoplasm of breast: Secondary | ICD-10-CM | POA: Diagnosis not present

## 2021-04-20 DIAGNOSIS — Z825 Family history of asthma and other chronic lower respiratory diseases: Secondary | ICD-10-CM

## 2021-04-20 DIAGNOSIS — B961 Klebsiella pneumoniae [K. pneumoniae] as the cause of diseases classified elsewhere: Secondary | ICD-10-CM | POA: Diagnosis present

## 2021-04-20 DIAGNOSIS — Z87440 Personal history of urinary (tract) infections: Secondary | ICD-10-CM

## 2021-04-20 DIAGNOSIS — N12 Tubulo-interstitial nephritis, not specified as acute or chronic: Secondary | ICD-10-CM | POA: Diagnosis not present

## 2021-04-20 DIAGNOSIS — F32A Depression, unspecified: Secondary | ICD-10-CM | POA: Diagnosis present

## 2021-04-20 DIAGNOSIS — Z8249 Family history of ischemic heart disease and other diseases of the circulatory system: Secondary | ICD-10-CM | POA: Diagnosis not present

## 2021-04-20 DIAGNOSIS — R109 Unspecified abdominal pain: Secondary | ICD-10-CM

## 2021-04-20 DIAGNOSIS — Z833 Family history of diabetes mellitus: Secondary | ICD-10-CM

## 2021-04-20 DIAGNOSIS — Z830 Family history of human immunodeficiency virus [HIV] disease: Secondary | ICD-10-CM

## 2021-04-20 DIAGNOSIS — Z841 Family history of disorders of kidney and ureter: Secondary | ICD-10-CM | POA: Diagnosis not present

## 2021-04-20 DIAGNOSIS — F419 Anxiety disorder, unspecified: Secondary | ICD-10-CM | POA: Diagnosis present

## 2021-04-20 DIAGNOSIS — G2581 Restless legs syndrome: Secondary | ICD-10-CM | POA: Diagnosis present

## 2021-04-20 DIAGNOSIS — N200 Calculus of kidney: Secondary | ICD-10-CM | POA: Diagnosis present

## 2021-04-20 DIAGNOSIS — N390 Urinary tract infection, site not specified: Secondary | ICD-10-CM

## 2021-04-20 DIAGNOSIS — Z20822 Contact with and (suspected) exposure to covid-19: Secondary | ICD-10-CM | POA: Diagnosis present

## 2021-04-20 DIAGNOSIS — Z86718 Personal history of other venous thrombosis and embolism: Secondary | ICD-10-CM

## 2021-04-20 DIAGNOSIS — N920 Excessive and frequent menstruation with regular cycle: Secondary | ICD-10-CM | POA: Diagnosis present

## 2021-04-20 DIAGNOSIS — Z83438 Family history of other disorder of lipoprotein metabolism and other lipidemia: Secondary | ICD-10-CM

## 2021-04-20 DIAGNOSIS — Z803 Family history of malignant neoplasm of breast: Secondary | ICD-10-CM

## 2021-04-20 DIAGNOSIS — Z95828 Presence of other vascular implants and grafts: Secondary | ICD-10-CM

## 2021-04-20 DIAGNOSIS — Z7902 Long term (current) use of antithrombotics/antiplatelets: Secondary | ICD-10-CM

## 2021-04-20 DIAGNOSIS — C50411 Malignant neoplasm of upper-outer quadrant of right female breast: Secondary | ICD-10-CM | POA: Diagnosis present

## 2021-04-20 LAB — COMPREHENSIVE METABOLIC PANEL
ALT: 41 U/L (ref 0–44)
AST: 41 U/L (ref 15–41)
Albumin: 3.9 g/dL (ref 3.5–5.0)
Alkaline Phosphatase: 53 U/L (ref 38–126)
Anion gap: 11 (ref 5–15)
BUN: 24 mg/dL — ABNORMAL HIGH (ref 6–20)
CO2: 21 mmol/L — ABNORMAL LOW (ref 22–32)
Calcium: 9.3 mg/dL (ref 8.9–10.3)
Chloride: 106 mmol/L (ref 98–111)
Creatinine, Ser: 0.91 mg/dL (ref 0.44–1.00)
GFR, Estimated: >60 mL/min (ref >60)
Glucose, Bld: 106 mg/dL — ABNORMAL HIGH (ref 70–99)
Potassium: 4.1 mmol/L (ref 3.5–5.1)
Sodium: 138 mmol/L (ref 135–145)
Total Bilirubin: 0.3 mg/dL (ref 0.3–1.2)
Total Protein: 7.7 g/dL (ref 6.5–8.1)

## 2021-04-20 LAB — CBC WITH DIFFERENTIAL/PLATELET
Abs Immature Granulocytes: 0.03 10*3/uL (ref 0.00–0.07)
Basophils Absolute: 0.1 10*3/uL (ref 0.0–0.1)
Basophils Relative: 1 %
Eosinophils Absolute: 0.3 10*3/uL (ref 0.0–0.5)
Eosinophils Relative: 3 %
HCT: 40.7 % (ref 36.0–46.0)
Hemoglobin: 13.7 g/dL (ref 12.0–15.0)
Immature Granulocytes: 0 %
Lymphocytes Relative: 24 %
Lymphs Abs: 2.5 10*3/uL (ref 0.7–4.0)
MCH: 28.5 pg (ref 26.0–34.0)
MCHC: 33.7 g/dL (ref 30.0–36.0)
MCV: 84.8 fL (ref 80.0–100.0)
Monocytes Absolute: 0.6 10*3/uL (ref 0.1–1.0)
Monocytes Relative: 5 %
Neutro Abs: 7 10*3/uL (ref 1.7–7.7)
Neutrophils Relative %: 67 %
Platelets: 369 10*3/uL (ref 150–400)
RBC: 4.8 MIL/uL (ref 3.87–5.11)
RDW: 12.8 % (ref 11.5–15.5)
WBC: 10.5 10*3/uL (ref 4.0–10.5)
nRBC: 0 % (ref 0.0–0.2)

## 2021-04-20 LAB — RESP PANEL BY RT-PCR (FLU A&B, COVID) ARPGX2
Influenza A by PCR: NEGATIVE
Influenza B by PCR: NEGATIVE
SARS Coronavirus 2 by RT PCR: NEGATIVE

## 2021-04-20 LAB — URINALYSIS, ROUTINE W REFLEX MICROSCOPIC
Bilirubin Urine: NEGATIVE
Glucose, UA: NEGATIVE mg/dL
Ketones, ur: NEGATIVE mg/dL
Nitrite: NEGATIVE
Protein, ur: NEGATIVE mg/dL
Specific Gravity, Urine: 1.017 (ref 1.005–1.030)
WBC, UA: >50 WBC/hpf — ABNORMAL HIGH (ref 0–5)
pH: 6 (ref 5.0–8.0)

## 2021-04-20 MED ORDER — FENTANYL CITRATE PF 50 MCG/ML IJ SOSY
50.0000 ug | PREFILLED_SYRINGE | Freq: Once | INTRAMUSCULAR | Status: AC
Start: 2021-04-20 — End: 2021-04-20
  Administered 2021-04-20: 50 ug via INTRAVENOUS
  Filled 2021-04-20: qty 1

## 2021-04-20 MED ORDER — ONDANSETRON HCL 4 MG/2ML IJ SOLN
4.0000 mg | Freq: Once | INTRAMUSCULAR | Status: AC
  Administered 2021-04-20: 4 mg via INTRAVENOUS
  Filled 2021-04-20: qty 2

## 2021-04-20 MED ORDER — CLOPIDOGREL BISULFATE 75 MG PO TABS
75.0000 mg | ORAL_TABLET | Freq: Every day | ORAL | Status: DC
  Administered 2021-04-21 – 2021-04-22 (×2): 75 mg via ORAL
  Filled 2021-04-20 (×2): qty 1

## 2021-04-20 MED ORDER — SODIUM CHLORIDE 0.9 % IV SOLN
1.0000 g | INTRAVENOUS | Status: DC
  Administered 2021-04-21 – 2021-04-22 (×2): 1 g via INTRAVENOUS
  Filled 2021-04-20 (×2): qty 10

## 2021-04-20 MED ORDER — ONDANSETRON HCL 4 MG/2ML IJ SOLN
4.0000 mg | Freq: Four times a day (QID) | INTRAMUSCULAR | Status: DC | PRN
  Administered 2021-04-22 – 2021-04-23 (×2): 4 mg via INTRAVENOUS
  Filled 2021-04-20 (×2): qty 2

## 2021-04-20 MED ORDER — SODIUM CHLORIDE 0.9 % IV SOLN: INTRAVENOUS | Status: DC

## 2021-04-20 MED ORDER — SODIUM CHLORIDE 0.9 % IV BOLUS
500.0000 mL | Freq: Once | INTRAVENOUS | Status: AC
  Administered 2021-04-20: 500 mL via INTRAVENOUS

## 2021-04-20 MED ORDER — KETOROLAC TROMETHAMINE 30 MG/ML IJ SOLN
30.0000 mg | Freq: Once | INTRAMUSCULAR | Status: AC
  Administered 2021-04-20: 30 mg via INTRAVENOUS
  Filled 2021-04-20: qty 1

## 2021-04-20 MED ORDER — GABAPENTIN 300 MG PO CAPS
300.0000 mg | ORAL_CAPSULE | Freq: Three times a day (TID) | ORAL | Status: DC
  Administered 2021-04-20 – 2021-04-22 (×7): 300 mg via ORAL
  Filled 2021-04-20 (×7): qty 1

## 2021-04-20 MED ORDER — APIXABAN 5 MG PO TABS: 5.0000 mg | ORAL_TABLET | Freq: Two times a day (BID) | ORAL | Status: DC

## 2021-04-20 MED ORDER — SODIUM CHLORIDE 0.9 % IV SOLN
1.0000 g | Freq: Once | INTRAVENOUS | Status: AC
  Administered 2021-04-20: 1 g via INTRAVENOUS
  Filled 2021-04-20: qty 10

## 2021-04-20 MED ORDER — DOCUSATE SODIUM 100 MG PO CAPS
100.0000 mg | ORAL_CAPSULE | Freq: Two times a day (BID) | ORAL | Status: DC
  Administered 2021-04-20 – 2021-04-22 (×5): 100 mg via ORAL
  Filled 2021-04-20 (×5): qty 1

## 2021-04-20 MED ORDER — ACETAMINOPHEN 325 MG PO TABS
650.0000 mg | ORAL_TABLET | Freq: Four times a day (QID) | ORAL | Status: DC | PRN
  Administered 2021-04-21: 650 mg via ORAL
  Filled 2021-04-20: qty 2

## 2021-04-20 MED ORDER — ONDANSETRON HCL 4 MG PO TABS: 4.0000 mg | ORAL_TABLET | Freq: Four times a day (QID) | ORAL | Status: DC | PRN

## 2021-04-20 MED ORDER — OXYCODONE HCL 5 MG PO TABS
15.0000 mg | ORAL_TABLET | ORAL | Status: DC | PRN
  Administered 2021-04-20 – 2021-04-21 (×4): 15 mg via ORAL
  Filled 2021-04-20 (×4): qty 3

## 2021-04-20 MED ORDER — ROPINIROLE HCL 1 MG PO TABS
2.0000 mg | ORAL_TABLET | Freq: Every day | ORAL | Status: DC
  Administered 2021-04-20 – 2021-04-22 (×3): 4 mg via ORAL
  Filled 2021-04-20 (×3): qty 4

## 2021-04-20 MED ORDER — TAMOXIFEN CITRATE 10 MG PO TABS
20.0000 mg | ORAL_TABLET | Freq: Every day | ORAL | Status: DC
  Administered 2021-04-20 – 2021-04-22 (×3): 20 mg via ORAL
  Filled 2021-04-20 (×3): qty 2

## 2021-04-20 MED ORDER — ALPRAZOLAM 0.25 MG PO TABS
0.2500 mg | ORAL_TABLET | Freq: Every evening | ORAL | Status: DC | PRN
  Administered 2021-04-20 – 2021-04-21 (×2): 0.25 mg via ORAL
  Filled 2021-04-20 (×3): qty 1

## 2021-04-20 MED ORDER — ATENOLOL 100 MG PO TABS
100.0000 mg | ORAL_TABLET | Freq: Every day | ORAL | Status: DC
  Administered 2021-04-21 – 2021-04-22 (×2): 100 mg via ORAL
  Filled 2021-04-20 (×2): qty 1
  Filled 2021-04-20: qty 2
  Filled 2021-04-20: qty 1
  Filled 2021-04-20: qty 2

## 2021-04-20 MED ORDER — KETOROLAC TROMETHAMINE 30 MG/ML IJ SOLN
30.0000 mg | Freq: Four times a day (QID) | INTRAMUSCULAR | Status: DC | PRN
  Administered 2021-04-20 – 2021-04-23 (×6): 30 mg via INTRAVENOUS
  Filled 2021-04-20 (×7): qty 1

## 2021-04-20 MED ORDER — IRBESARTAN 75 MG PO TABS
37.5000 mg | ORAL_TABLET | Freq: Every day | ORAL | Status: DC
  Filled 2021-04-20: qty 1

## 2021-04-20 MED ORDER — ASPIRIN EC 81 MG PO TBEC
81.0000 mg | DELAYED_RELEASE_TABLET | Freq: Every day | ORAL | Status: DC
  Administered 2021-04-21 – 2021-04-22 (×2): 81 mg via ORAL
  Filled 2021-04-20 (×2): qty 1

## 2021-04-20 MED ORDER — ACETAMINOPHEN 650 MG RE SUPP: 650.0000 mg | Freq: Four times a day (QID) | RECTAL | Status: DC | PRN

## 2021-04-20 MED ORDER — METHOCARBAMOL 500 MG PO TABS
500.0000 mg | ORAL_TABLET | Freq: Every evening | ORAL | Status: DC | PRN
  Administered 2021-04-20 – 2021-04-21 (×3): 500 mg via ORAL
  Filled 2021-04-20 (×4): qty 1

## 2021-04-20 NOTE — H&P (Signed)
History and Physical    Susan Benton TIR:[id number] DOB: 1970-07-28 DOA: 04/20/2021  PCP: Celene Squibb, MD   Patient coming from:  Home.  I have personally briefly reviewed patient's old medical records in [facility]  Chief Complaint:  Left Flank pain with increased urinary frequency.  HPI: Susan Benton is a 50 y.o. 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  Patient reports having left flank pain 10/10 on the pain scale radiating towards the left groin area.  She went to [facility] yesterday and was given Toradol which resolved the pain. Patient was advised to admit for IV antibiotics.  Patient felt better and she went home with oral antibiotics and Oxycodone.  She has made appointment with urologist on Monday.  After she went home her pain got worse, she could not control the pain with oral oxycodone.  She reports having subjective fever associated with chills.  Pain was unbearable so that made her come back in the ED. Patient reports having recurrent UTI for last 4 months after she is diagnosed with  staghorn calculi in the left kidney.  She is regularly following up with urologist at [facility] and she is  scheduled to have stone removal scheduled on December 6.  ED Course: She was hemodynamically stable except hypertension. HR 77, RR 20, temp 98.4, BP 148/87, SPO2 99% on room air. Labs: Sodium 138, potassium 4.1, chloride 106, bicarb 21, glucose 106, BUN 24, creatinine 0.91, calcium 9.3, anion gap 11, alkaline phosphatase 53, albumin 3.9, AST 41, ALT 41, total protein 7.7, WBC 10.5, hemoglobin 13.7, hematocrit 40.7, MCV 84.8, platelet 369, UA:  leukocyte : Large, Nitrite: negative. CTA / P:Nonobstructive 2 cm left staghorn calculus.  Other nonobstructive smaller left nephrolithiasis. Query post 4 mm right nephrolithiasis. No definite findings to suggest superimposed infection; however, limited evaluation on this noncontrast study  Review of Systems:  Review of Systems  Constitutional:  Positive for chills, fever and malaise/fatigue.  HENT: Negative.    Eyes: Negative.   Respiratory: Negative.    Cardiovascular: Negative.   Gastrointestinal:  Positive for abdominal pain.  Genitourinary:  Positive for flank pain, frequency and urgency.  Skin: Negative.   Neurological: Negative.   Psychiatric/Behavioral: Negative.      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   Low iron    Restless legs syndrome     Past Surgical History:  Procedure Laterality Date   AXILLARY SENTINEL NODE BIOPSY Right 11/20/2016   Procedure: AXILLARY SENTINEL NODE BIOPSY;  Surgeon: Aviva Signs, MD;  Location: [facility];  Service: General;  Laterality: Right;  Sentinel Node Injection @ 10:30am   BREAST RECONSTRUCTION WITH PLACEMENT OF TISSUE EXPANDER AND FLEX HD (ACELLULAR HYDRATED DERMIS) Bilateral 12/25/2016   Procedure: BREAST RECONSTRUCTION WITH PLACEMENT OF TISSUE EXPANDER AND FLEX HD (ACELLULAR HYDRATED DERMIS);  Surgeon: Wallace Going, DO;  Location: [place];  Service: Clinical cytogeneticist;  Laterality: Bilateral;   CARDIAC CATHETERIZATION     DILITATION & CURRETTAGE/HYSTROSCOPY WITH NOVASURE ABLATION N/A 10/23/2016   Procedure: DILATATION & CURETTAGE/HYSTEROSCOPY WITH NOVASURE ENDOMETRIAL ABLATION;  Surgeon: Florian Buff, MD;  Location: [facility];  Service: Gynecology;  Laterality: N/A;   MASTECTOMY MODIFIED RADICAL Right  10/23/2016   Procedure: MASTECTOMY MODIFIED RADICAL;  Surgeon: Aviva Signs, MD;  Location: [facility];  Service: General;  Laterality: Right;   SIMPLE MASTECTOMY WITH AXILLARY SENTINEL NODE BIOPSY Left 10/23/2016   Procedure: SIMPLE MASTECTOMY;  Surgeon: Aviva Signs, MD;  Location: [facility];  Service: General;  Laterality: Left;   TISSUE EXPANDER PLACEMENT Bilateral 02/27/2017   Procedure: REMOVAL TISSUE EXPANDER;  Surgeon: Wallace Going, DO;  Location: [place];  Service: Plastics;  Laterality: Bilateral;   TONSILECTOMY, ADENOIDECTOMY, BILATERAL MYRINGOTOMY AND TUBES Bilateral [place]   2000     reports that she has been smoking cigarettes. She has a 5.00 pack-year smoking history. She has never used smokeless tobacco. She reports that she does not drink alcohol and does not use drugs.  Allergies  Allergen Reactions   Tramadol Other (See Comments)    Migraine headaches     Family History  Problem Relation Age of Onset   Hypertension Mother    Cancer Mother    Diabetes Father    Hypertension Father    Kidney disease Father    Hyperlipidemia Father    Asthma Father    COPD Father    Heart disease Father    HIV/AIDS Brother    Cancer Maternal Grandmother        breast   Breast cancer Maternal Grandmother    Hypertension Son     Family history reviewed and not pertinent.  Prior to Admission medications   Medication Sig Start Date End Date Taking? Authorizing Provider  ALPRAZolam Duanne Moron) 0.5 MG tablet TAKE 1 TABLET BY MOUTH TWICE A DAY AS NEEDED FOR ANXIETY 06/13/17   Holley Bouche, NP  aspirin 81 MG EC tablet Take 81 mg by mouth daily. 12/17/17   [provider]  atenolol (TENORMIN) 100 MG tablet Take 1 tablet (100 mg total) by mouth daily. 11/05/16   Aviva Signs, MD  cefpodoxime (VANTIN) 200 MG tablet Take 1 tablet (200 mg total) by mouth 2 (two) times daily. 04/19/21   Truddie Hidden, MD  clopidogrel (PLAVIX) 75 MG tablet Take 75 mg by  mouth daily. 12/28/18   [provider]  ELIQUIS 5 MG TABS tablet Take 1 tablet (5 mg total) by mouth 2 (two) times daily. 11/12/18   Derek Jack, MD  gabapentin (NEURONTIN) 300 MG capsule TAKE ONE CAPSULE BY MOUTH 3 TIMES A DAY Patient taking differently: Take 300 mg by mouth 3 (three) times daily. 01/06/17   Susy Frizzle, MD  methocarbamol (ROBAXIN) 500 MG tablet Take 1 tablet (500 mg total) by mouth at bedtime as needed for muscle spasms. 03/26/17   Avie Echevaria B, PA-C  olmesartan (BENICAR) 40 MG tablet Take 40 mg by mouth daily. 04/26/20   [provider]  oxyCODONE (ROXICODONE) 15 MG immediate release tablet Take 15 mg by mouth in the morning, at noon, and at bedtime.    [provider]  rOPINIRole (REQUIP) 2 MG tablet 1-2 tabs po qhs Patient taking differently: Take 2-4 mg by mouth at bedtime. 07/11/16   Susy Frizzle, MD  tamoxifen (NOLVADEX) 20 MG tablet TAKE 1 TABLET BY MOUTH EVERYDAY  AT BEDTIME Patient taking differently: Take 20 mg by mouth at bedtime. 02/19/18   Derek Jack, MD    Physical Exam: Vitals:   04/20/21 1329  BP: (!) 148/87  Pulse: 77  Resp: 20  Temp: 98.4 F (36.9 C)  TempSrc: Oral  SpO2: 99%    Constitutional: Appears comfortable, appears in a lot of pain. Vitals:   04/20/21 1329  BP: (!) 148/87  Pulse: 77  Resp: 20  Temp: 98.4 F (36.9 C)  TempSrc: Oral  SpO2: 99%   Eyes: PERRL, lids and conjunctivae normal ENMT: Mucous membranes are moist.  Posterior pharynx no exudate .Normal dentition.  Neck: normal, supple, no masses, no thyromegaly Respiratory: Clear to auscultation bilaterally, no wheezing, no crackles.   Cardiovascular: S1-S2 heard, regular rate and rhythm, no murmur.   Abdomen: Soft, non distended, left flank tenderness+ , CVA tenderness+, BS + Musculoskeletal:  Good ROM, no contractures. Normal muscle tone.  Skin: no rashes, lesions, ulcers. No induration Neurologic: CN 2-12 grossly  intact. Sensation intact, DTR normal. Strength 5/5 in all 4.  Psychiatric: Normal judgment and insight. Alert and oriented x 3. Normal mood.     Labs on Admission: I have personally reviewed following labs and imaging studies  CBC: Recent Labs  Lab 04/19/21 1918 04/20/21 1401  WBC 13.0* 10.5  NEUTROABS 8.9* 7.0  HGB 12.9 13.7  HCT 39.2 40.7  MCV 84.5 84.8  PLT 377 540   Basic Metabolic Panel: Recent Labs  Lab 04/19/21 1918 04/20/21 1401  NA 140 138  K 4.3 4.1  CL 105 106  CO2 22 21*  GLUCOSE 153* 106*  BUN 22* 24*  CREATININE 0.97 0.91  CALCIUM 9.9 9.3   GFR: CrCl cannot be calculated (Unknown ideal weight.). Liver Function Tests: Recent Labs  Lab 04/20/21 1401  AST 41  ALT 41  ALKPHOS 53  BILITOT 0.3  PROT 7.7  ALBUMIN 3.9   No results for input(s): LIPASE, AMYLASE in the last 168 hours. No results for input(s): AMMONIA in the last 168 hours. Coagulation Profile: No results for input(s): INR, PROTIME in the last 168 hours. Cardiac Enzymes: No results for input(s): CKTOTAL, CKMB, CKMBINDEX, TROPONINI in the last 168 hours. BNP (last 3 results) No results for input(s): PROBNP in the last 8760 hours. HbA1C: No results for input(s): HGBA1C in the last 72 hours. CBG: No results for input(s): GLUCAP in the last 168 hours. Lipid Profile: No results for input(s): CHOL, HDL, LDLCALC, TRIG, CHOLHDL, LDLDIRECT in the last 72 hours. Thyroid Function Tests: No results for input(s): TSH, T4TOTAL, FREET4, T3FREE, THYROIDAB in the last 72 hours. Anemia Panel: No results for input(s): VITAMINB12, FOLATE, FERRITIN, TIBC, IRON, RETICCTPCT in the last 72 hours. Urine analysis:    Component Value Date/Time   COLORURINE YELLOW 04/20/2021 1401   APPEARANCEUR HAZY (A) 04/20/2021 1401   LABSPEC 1.017 04/20/2021 1401   PHURINE 6.0 04/20/2021 1401   GLUCOSEU NEGATIVE 04/20/2021 1401   HGBUR SMALL (A) 04/20/2021 1401   BILIRUBINUR NEGATIVE 04/20/2021 1401   KETONESUR  NEGATIVE 04/20/2021 1401   PROTEINUR NEGATIVE 04/20/2021 1401   NITRITE NEGATIVE 04/20/2021 1401   LEUKOCYTESUR LARGE (A) 04/20/2021 1401    Radiological Exams on Admission: CT Renal Stone Study  Result Date: 04/19/2021 CLINICAL DATA:  Flank pain. Large stone on the left. Schedule surgery as cannot passed stone on 05/11/2021. Patient has increased severe pain EXAM: CT ABDOMEN AND PELVIS WITHOUT CONTRAST TECHNIQUE: Multidetector CT imaging of the abdomen and pelvis was performed  following the standard protocol without IV contrast. COMPARISON:  None. FINDINGS: Lower chest: No acute abnormality.  Coronary artery calcifications. Hepatobiliary: The liver is enlarged measuring up to 22 cm. The hepatic parenchyma is diffusely hypodense compared to the splenic parenchyma consistent with fatty infiltration. No focal liver abnormality. Pericholecystic focal fatty sparing. Contracted gallbladder. No gallstones, gallbladder wall thickening, or pericholecystic fluid. No biliary dilatation. Pancreas: No focal lesion. Normal pancreatic contour. No surrounding inflammatory changes. No main pancreatic ductal dilatation. Spleen: Normal in size without focal abnormality. Adrenals/Urinary Tract: No adrenal nodule bilaterally. At least 2 cm left staghorn calculi. Several other smaller left renal stones noted. Query amorphous 4 mm calcified stone within the right kidney. No hydronephrosis. No definite contour-deforming renal mass. Scarred and atrophic left kidney. No ureterolithiasis or hydroureter. The urinary bladder is unremarkable. Stomach/Bowel: Stomach is within normal limits. No evidence of bowel wall thickening or dilatation. Appendix appears normal. Vascular/Lymphatic: SMA origin stent with limited evaluation this noncontrast study no abdominal aorta or iliac aneurysm. Severe atherosclerotic plaque of the aorta and its branches. No abdominal, pelvic, or inguinal lymphadenopathy. Reproductive: There is a 3 x 2.1 cm  right ovarian cystic lesion. No follow-up imaging recommended. Note: This recommendation does not apply to premenarchal patients and to those with increased risk (genetic, family history, elevated tumor markers or other high-risk factors) of ovarian cancer. Reference: JACR 2020 Feb; 17(2):248-254. Otherwise uterus and bilateral adnexa are unremarkable. Other: No intraperitoneal free fluid. No intraperitoneal free gas. No organized fluid collection. Musculoskeletal: No abdominal wall hernia or abnormality. No suspicious lytic or blastic osseous lesions. No acute displaced fracture. Multilevel degenerative changes of the spine. IMPRESSION: 1. Nonobstructive 2 cm left staghorn calculus. Other nonobstructive smaller left nephrolithiasis. Query post 4 mm right nephrolithiasis. No definite findings to suggest superimposed infection; however, limited evaluation on this noncontrast study. 2. Hepatomegaly and hepatic steatosis. 3.  Aortic Atherosclerosis (ICD10-I70.0). Electronically Signed   By: Iven Finn M.D.   On: 04/19/2021 20:16    EKG: Independently reviewed.  Not done. Ordered, follow up EKG   Assessment/Plan Principal Problem:   Pyelonephritis Active Problems:   Depression   Hypertension   Menorrhagia with regular cycle   Malignant neoplasm of upper-outer quadrant of right female breast (HCC)   Left flank pain /presumed pyelonephritis: Patient presented with left flank pain associated with nausea and vomiting. UA consistent with UTI.  Patient does have CVA tenderness. Continue IV ceftriaxone.,  Follow-up urine cultures. Patient does not meet sepsis criteria. Continue adequate pain control with oxycodone and Toradol. Continue IV fluids.  Essential hypertension: Continue losartan, atenolol.  Anxiety disorder: Continue  Xanax.  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  Continue aspirin and Plavix. She has completed Eliquis  for 2 years.  Left staghorn calculi.: There is no obstruction or hydronephrosis noted. Please consult urology in the morning.    DVT prophylaxis: Eliquis Code Status: Full code. Family Communication: No family at bed side. Disposition Plan:   Status is: Inpatient  Remains inpatient appropriate because:  Presumptive pyelonephritis requiring IV antibiotics.  Consults called: None Admission status: Inpatient   Shawna Clamp MD [facility]   If 7PM-7AM, please contact night-coverage [email or web address] Password [facility]  04/20/2021, 6:46 PM

## 2021-04-20 NOTE — ED Provider Notes (Signed)
Emergency Medicine Provider Triage Evaluation Note  Susan Benton , a 50 y.o. female  was evaluated in triage.  Pt complains of Pyelonephritis / possible infected kidney stone.  She is [facility] Dr. Amalia Hailey.  Supposed to have surgery in about 3 weeks.  Seen yesterday at [facility]- DB diagnosed with UTI, kidney stone.  At that time patient had just completed antibiotics.  Previous provider spoke with [facility] urology they recommended Vantin (Klebsiella UTI).  She was offered admission over here at [facility] however patient declined.  She had worsening pain this morning despite taking her home pain medication and subsequently came here evaluation  CT scan yesterday  Review of Systems  Positive: Flank pain, dysuria Negative: fever  Physical Exam  BP (!) 148/87 (BP Location: Right Arm)   Pulse 77   Temp 98.4 F (36.9 C) (Oral)   Resp 20   SpO2 99%  Gen:   Awake, appears uncomfortable Resp:  Normal effort  MSK:   Moves extremities without difficulty  Other:    Medical Decision Making  Medically screening exam initiated at 1:32 PM.  Appropriate orders placed.  Susan Benton was informed that the remainder of the evaluation will be completed by another provider, this initial triage assessment does not replace that evaluation, and the importance of remaining in the ED until their evaluation is complete.  Flank pain, known kidney stone, recurrent UTI   Allon Costlow A, PA-C 04/20/21 1335    Wyvonnia Dusky, MD 04/20/21 1615

## 2021-04-20 NOTE — ED Triage Notes (Signed)
Pt BIB EMS from her mothers house. Pt c/o kidney stone. Pt states she was seen at [facility] yesterday for same. Pt has surgery scheduled 12/16 for kidney stone. Pt c/o 10/10 left flank pain. Pt states she also has a bladder infection at this time.

## 2021-04-20 NOTE — ED Provider Notes (Signed)
[facility] DEPT Provider Note   CSN: [id number] Arrival date & time: 04/20/21  1325     History Chief Complaint  Patient presents with   Nephrolithiasis    Susan Benton is a 50 y.o. female.  He is here with a complaint of continued left flank pain into the left lower quadrant its been going on for almost a week.  She is a known large kidney stone and gets frequent urinary tract infections.  She is was to get surgery by Dr. Amalia Hailey at [facility] but they were unable to do procedure till December 16.  She was seen at [facility] yesterday and had a CT demonstrating stone.  She was placed on IV and oral antibiotics.  She is on Plavix but has been unable to take this medicine for a few days due to nausea and pain.  No fevers.  Reportedly history of Klebsiella in the past and is taking Vantin right now.  Complaining of intractable pain.  Ran out of her oxycodone.  The history is provided by the patient.  Flank Pain This is a recurrent problem. The current episode started more than 2 days ago. The problem occurs constantly. The problem has not changed since onset.Associated symptoms include abdominal pain. Pertinent negatives include no chest pain, no headaches and no shortness of breath. Nothing aggravates the symptoms. Nothing relieves the symptoms. She has tried rest for the symptoms. The treatment provided no relief.      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   Low iron    Restless legs syndrome     Patient Active Problem List   Diagnosis Date Noted   Acquired absence of breast and absent nipple, left 12/25/2016   S/P right  mastectomy 10/23/2016   Malignant neoplasm of upper-outer quadrant of right female breast ([place])    Family history of breast cancer    Fibroids, submucosal 10/04/2016   Menstrual cramps 09/25/2016   Menorrhagia with regular cycle 09/25/2016   Mass of upper outer quadrant of right breast 09/25/2016   Depression    Hypertension     Past Surgical History:  Procedure Laterality Date   AXILLARY SENTINEL NODE BIOPSY Right 11/20/2016   Procedure: AXILLARY SENTINEL NODE BIOPSY;  Surgeon: Aviva Signs, MD;  Location: [facility];  Service: General;  Laterality: Right;  Sentinel Node Injection @ 10:30am   BREAST RECONSTRUCTION WITH PLACEMENT OF TISSUE EXPANDER AND FLEX HD (ACELLULAR HYDRATED DERMIS) Bilateral 12/25/2016   Procedure: BREAST RECONSTRUCTION WITH PLACEMENT OF TISSUE EXPANDER AND FLEX HD (ACELLULAR HYDRATED DERMIS);  Surgeon: Wallace Going, DO;  Location: [facility];  Service: Plastics;  Laterality: Bilateral;   CARDIAC CATHETERIZATION     DILITATION & CURRETTAGE/HYSTROSCOPY WITH NOVASURE ABLATION N/A 10/23/2016   Procedure: DILATATION & CURETTAGE/HYSTEROSCOPY WITH NOVASURE ENDOMETRIAL ABLATION;  Surgeon: Florian Buff, MD;  Location: [facility];  Service: Gynecology;  Laterality: N/A;   MASTECTOMY MODIFIED RADICAL Right 10/23/2016   Procedure: MASTECTOMY MODIFIED RADICAL;  Surgeon: Aviva Signs, MD;  Location: [facility];  Service: General;  Laterality: Right;   SIMPLE MASTECTOMY WITH AXILLARY SENTINEL NODE  BIOPSY Left 10/23/2016   Procedure: SIMPLE MASTECTOMY;  Surgeon: Aviva Signs, MD;  Location: [facility];  Service: General;  Laterality: Left;   TISSUE EXPANDER PLACEMENT Bilateral 02/27/2017   Procedure: REMOVAL TISSUE EXPANDER;  Surgeon: Wallace Going, DO;  Location: [facility];  Service: Plastics;  Laterality: Bilateral;   TONSILECTOMY, ADENOIDECTOMY, BILATERAL MYRINGOTOMY AND TUBES Bilateral 1984   TUBAL LIGATION  1998   2000     OB History     Gravida  2   Para  2    Term      Preterm      AB      Living         SAB      IAB      Ectopic      Multiple      Live Births              Family History  Problem Relation Age of Onset   Hypertension Mother    Cancer Mother    Diabetes Father    Hypertension Father    Kidney disease Father    Hyperlipidemia Father    Asthma Father    COPD Father    Heart disease Father    HIV/AIDS Brother    Cancer Maternal Grandmother        breast   Breast cancer Maternal Grandmother    Hypertension Son     Social History   Tobacco Use   Smoking status: Every Day    Packs/day: 0.25    Years: 20.00    Pack years: 5.00    Types: Cigarettes    Last attempt to quit: 07/20/2016    Years since quitting: 4.7   Smokeless tobacco: Never   Tobacco comments:    3-4 per day  Vaping Use   Vaping Use: Former  Substance Use Topics   Alcohol use: No   Drug use: No    Home Medications Prior to Admission medications   Medication Sig Start Date End Date Taking? Authorizing Provider  ALPRAZolam Duanne Moron) 0.5 MG tablet TAKE 1 TABLET BY MOUTH TWICE A DAY AS NEEDED FOR ANXIETY 06/13/17   Holley Bouche, NP  aspirin 81 MG EC tablet Take 81 mg by mouth daily. 12/17/17   [provider]  atenolol (TENORMIN) 100 MG tablet Take 1 tablet (100 mg total) by mouth daily. 11/05/16   Aviva Signs, MD  cefpodoxime (VANTIN) 200 MG tablet Take 1 tablet (200 mg total) by mouth 2 (two) times daily. 04/19/21   Truddie Hidden, MD  clopidogrel (PLAVIX) 75 MG tablet Take 75 mg by mouth daily. 12/28/18   [provider]  ELIQUIS 5 MG TABS tablet Take 1 tablet (5 mg total) by mouth 2 (two) times daily. 11/12/18   Derek Jack, MD  gabapentin (NEURONTIN) 300 MG capsule TAKE ONE CAPSULE BY MOUTH 3 TIMES A DAY Patient taking differently: Take 300 mg by mouth 3 (three) times daily. 01/06/17   Susy Frizzle, MD  methocarbamol (ROBAXIN) 500 MG tablet Take 1 tablet (500 mg total) by mouth at bedtime  as needed for muscle spasms. 03/26/17   Avie Echevaria B, PA-C  olmesartan (BENICAR) 40 MG tablet Take 40 mg by mouth daily. 04/26/20   [provider]  oxyCODONE (ROXICODONE) 15 MG immediate release tablet Take 15 mg by mouth in the morning, at noon, and at bedtime.    [provider]  rOPINIRole (REQUIP) 2 MG tablet 1-2 tabs po qhs  Patient taking differently: Take 2-4 mg by mouth at bedtime. 07/11/16   Susy Frizzle, MD  tamoxifen (NOLVADEX) 20 MG tablet TAKE 1 TABLET BY MOUTH EVERYDAY AT BEDTIME Patient taking differently: Take 20 mg by mouth at bedtime. 02/19/18   Derek Jack, MD    Allergies    Tramadol  Review of Systems   Review of Systems  Constitutional:  Negative for fever.  HENT:  Negative for sore throat.   Eyes:  Negative for visual disturbance.  Respiratory:  Negative for shortness of breath.   Cardiovascular:  Negative for chest pain.  Gastrointestinal:  Positive for abdominal pain, nausea and vomiting.  Genitourinary:  Positive for dysuria and flank pain.  Musculoskeletal:  Positive for back pain.  Skin:  Negative for rash.  Neurological:  Negative for headaches.   Physical Exam Updated Vital Signs BP (!) 148/87 (BP Location: Right Arm)   Pulse 77   Temp 98.4 F (36.9 C) (Oral)   Resp 20   SpO2 99%   Physical Exam Vitals and nursing note reviewed.  Constitutional:      General: She is not in acute distress.    Appearance: Normal appearance. She is well-developed.  HENT:     Head: Normocephalic and atraumatic.  Eyes:     Conjunctiva/sclera: Conjunctivae normal.  Cardiovascular:     Rate and Rhythm: Normal rate and regular rhythm.     Heart sounds: No murmur heard. Pulmonary:     Effort: Pulmonary effort is normal. No respiratory distress.     Breath sounds: Normal breath sounds.  Abdominal:     Palpations: Abdomen is soft.     Tenderness: There is no abdominal tenderness. There is no guarding or rebound.   Musculoskeletal:        General: No swelling or deformity. Normal range of motion.     Cervical back: Neck supple.  Skin:    General: Skin is warm and dry.     Capillary Refill: Capillary refill takes less than 2 seconds.  Neurological:     General: No focal deficit present.     Mental Status: She is alert.  Psychiatric:        Mood and Affect: Mood normal.    ED Results / Procedures / Treatments   Labs (all labs ordered are listed, but only abnormal results are displayed) Labs Reviewed  COMPREHENSIVE METABOLIC PANEL - Abnormal; Notable for the following components:      Result Value   CO2 21 (*)    Glucose, Bld 106 (*)    BUN 24 (*)    All other components within normal limits  URINALYSIS, ROUTINE W REFLEX MICROSCOPIC - Abnormal; Notable for the following components:   APPearance HAZY (*)    Hgb urine dipstick SMALL (*)    Leukocytes,Ua LARGE (*)    WBC, UA >50 (*)    Bacteria, UA RARE (*)    All other components within normal limits  COMPREHENSIVE METABOLIC PANEL - Abnormal; Notable for the following components:   CO2 20 (*)    BUN 26 (*)    Calcium 8.4 (*)    Total Protein 6.1 (*)    Albumin 3.3 (*)    All other components within normal limits  CBC - Abnormal; Notable for the following components:   Hemoglobin 11.1 (*)    HCT 34.4 (*)    All other components within normal limits  PHOSPHORUS - Abnormal; Notable for the following components:   Phosphorus 4.7 (*)  All other components within normal limits  RESP PANEL BY RT-PCR (FLU A&B, COVID) ARPGX2  CBC WITH DIFFERENTIAL/PLATELET  MAGNESIUM  HIV ANTIBODY (ROUTINE TESTING W REFLEX)    EKG None  Radiology CT Renal Stone Study  Result Date: 04/19/2021 CLINICAL DATA:  Flank pain. Large stone on the left. Schedule surgery as cannot passed stone on 05/11/2021. Patient has increased severe pain EXAM: CT ABDOMEN AND PELVIS WITHOUT CONTRAST TECHNIQUE: Multidetector CT imaging of the abdomen and pelvis was  performed following the standard protocol without IV contrast. COMPARISON:  None. FINDINGS: Lower chest: No acute abnormality.  Coronary artery calcifications. Hepatobiliary: The liver is enlarged measuring up to 22 cm. The hepatic parenchyma is diffusely hypodense compared to the splenic parenchyma consistent with fatty infiltration. No focal liver abnormality. Pericholecystic focal fatty sparing. Contracted gallbladder. No gallstones, gallbladder wall thickening, or pericholecystic fluid. No biliary dilatation. Pancreas: No focal lesion. Normal pancreatic contour. No surrounding inflammatory changes. No main pancreatic ductal dilatation. Spleen: Normal in size without focal abnormality. Adrenals/Urinary Tract: No adrenal nodule bilaterally. At least 2 cm left staghorn calculi. Several other smaller left renal stones noted. Query amorphous 4 mm calcified stone within the right kidney. No hydronephrosis. No definite contour-deforming renal mass. Scarred and atrophic left kidney. No ureterolithiasis or hydroureter. The urinary bladder is unremarkable. Stomach/Bowel: Stomach is within normal limits. No evidence of bowel wall thickening or dilatation. Appendix appears normal. Vascular/Lymphatic: SMA origin stent with limited evaluation this noncontrast study no abdominal aorta or iliac aneurysm. Severe atherosclerotic plaque of the aorta and its branches. No abdominal, pelvic, or inguinal lymphadenopathy. Reproductive: There is a 3 x 2.1 cm right ovarian cystic lesion. No follow-up imaging recommended. Note: This recommendation does not apply to premenarchal patients and to those with increased risk (genetic, family history, elevated tumor markers or other high-risk factors) of ovarian cancer. Reference: JACR 2020 Feb; 17(2):248-254. Otherwise uterus and bilateral adnexa are unremarkable. Other: No intraperitoneal free fluid. No intraperitoneal free gas. No organized fluid collection. Musculoskeletal: No abdominal wall  hernia or abnormality. No suspicious lytic or blastic osseous lesions. No acute displaced fracture. Multilevel degenerative changes of the spine. IMPRESSION: 1. Nonobstructive 2 cm left staghorn calculus. Other nonobstructive smaller left nephrolithiasis. Query post 4 mm right nephrolithiasis. No definite findings to suggest superimposed infection; however, limited evaluation on this noncontrast study. 2. Hepatomegaly and hepatic steatosis. 3.  Aortic Atherosclerosis (ICD10-I70.0). Electronically Signed   By: Iven Finn M.D.   On: 04/19/2021 20:16    Procedures Procedures   Medications Ordered in ED Medications  sodium chloride 0.9 % bolus 500 mL (has no administration in time range)  ondansetron (ZOFRAN) injection 4 mg (has no administration in time range)  ketorolac (TORADOL) 30 MG/ML injection 30 mg (has no administration in time range)  fentaNYL (SUBLIMAZE) injection 50 mcg (has no administration in time range)    ED Course  I have reviewed the triage vital signs and the nursing notes.  Pertinent labs & imaging results that were available during my care of the patient were reviewed by me and considered in my medical decision making (see chart for details).  Clinical Course as of 04/21/21 1033  Fri Apr 20, 2021  1653 Discussed with Dr. Claudia Desanctis urology.  She said in the setting of possible infection she would not recommend surgery.  The patient does not have any hydronephrosis so she did not think a percutaneous tube would be indicated.  She said she could follow-up in the office next week or continue  to wait for her surgery at [facility]. [MB]  1709 Discussed with Dr. Dwyane Dee [facility] hospitalist who will evaluate the patient for admission. [MB]    Clinical Course User Index [MB] Hayden Rasmussen, MD   MDM Rules/Calculators/A&P                          This patient complains of left flank left lower quadrant abdominal pain vomiting; this involves an extensive number of treatment Options  and is a complaint that carries with it a high risk of complications and Morbidity. The differential includes renal colic, pyelonephritis, obstruction, gastroenteritis, dehydration  I ordered, reviewed and interpreted labs, which included CBC with normal white count, hemoglobin stable, chemistries fairly unremarkable, urinalysis with signs of infection greater than 50 white cells, COVID and flu negative I ordered medication IV pain medication fluids, IV antibiotics  Previous records obtained and reviewed in epic including prior ED visits and notes from urology I consulted urology Dr. Claudia Desanctis and [facility] hospitalist Dr. Dwyane Dee and discussed lab and imaging findings  Critical Interventions: None  After the interventions stated above, I reevaluated the patient and found patient's pain to be improved.  She will be admitted to the hospital for further management of her symptoms.  Kinya Benton was evaluated in Emergency Department on 04/20/2021 for the symptoms described in the history of present illness. She was evaluated in the context of the global COVID-19 pandemic, which necessitated consideration that the patient might be at risk for infection with the SARS-CoV-2 virus that causes COVID-19. Institutional protocols and algorithms that pertain to the evaluation of patients at risk for COVID-19 are in a state of rapid change based on information released by regulatory bodies including the CDC and federal and state organizations. These policies and algorithms were followed during the patient's care in the ED.   Final Clinical Impression(s) / ED Diagnoses Final diagnoses:  Left flank pain  Urinary tract infection without hematuria, site unspecified    Rx / DC Orders ED Discharge Orders     None        Hayden Rasmussen, MD 04/21/21 1037

## 2021-04-21 DIAGNOSIS — N12 Tubulo-interstitial nephritis, not specified as acute or chronic: Secondary | ICD-10-CM | POA: Diagnosis not present

## 2021-04-21 LAB — CBC
HCT: 34.4 % — ABNORMAL LOW (ref 36.0–46.0)
Hemoglobin: 11.1 g/dL — ABNORMAL LOW (ref 12.0–15.0)
MCH: 28.4 pg (ref 26.0–34.0)
MCHC: 32.3 g/dL (ref 30.0–36.0)
MCV: 88 fL (ref 80.0–100.0)
Platelets: 236 10*3/uL (ref 150–400)
RBC: 3.91 MIL/uL (ref 3.87–5.11)
RDW: 13.2 % (ref 11.5–15.5)
WBC: 8.9 10*3/uL (ref 4.0–10.5)
nRBC: 0 % (ref 0.0–0.2)

## 2021-04-21 LAB — MAGNESIUM: Magnesium: 2.2 mg/dL (ref 1.7–2.4)

## 2021-04-21 LAB — COMPREHENSIVE METABOLIC PANEL
ALT: 30 U/L (ref 0–44)
AST: 31 U/L (ref 15–41)
Albumin: 3.3 g/dL — ABNORMAL LOW (ref 3.5–5.0)
Alkaline Phosphatase: 45 U/L (ref 38–126)
Anion gap: 12 (ref 5–15)
BUN: 26 mg/dL — ABNORMAL HIGH (ref 6–20)
CO2: 20 mmol/L — ABNORMAL LOW (ref 22–32)
Calcium: 8.4 mg/dL — ABNORMAL LOW (ref 8.9–10.3)
Chloride: 106 mmol/L (ref 98–111)
Creatinine, Ser: 0.92 mg/dL (ref 0.44–1.00)
GFR, Estimated: >60 mL/min (ref >60)
Glucose, Bld: 85 mg/dL (ref 70–99)
Potassium: 4.1 mmol/L (ref 3.5–5.1)
Sodium: 138 mmol/L (ref 135–145)
Total Bilirubin: 0.4 mg/dL (ref 0.3–1.2)
Total Protein: 6.1 g/dL — ABNORMAL LOW (ref 6.5–8.1)

## 2021-04-21 LAB — HIV ANTIBODY (ROUTINE TESTING W REFLEX): HIV Screen 4th Generation wRfx: NONREACTIVE

## 2021-04-21 LAB — PHOSPHORUS: Phosphorus: 4.7 mg/dL — ABNORMAL HIGH (ref 2.5–4.6)

## 2021-04-21 MED ORDER — MORPHINE SULFATE (PF) 2 MG/ML IV SOLN
2.0000 mg | INTRAVENOUS | Status: DC | PRN
  Administered 2021-04-21 – 2021-04-23 (×9): 2 mg via INTRAVENOUS
  Filled 2021-04-21 (×9): qty 1

## 2021-04-21 NOTE — Progress Notes (Signed)
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  Initially she had gone to [facility] ER where she was treated with IV Toradol, IV antibiotic and recommended admission but patient felt better and ended up going home on oral antibiotic and pain medicine.  But due to worsening of the symptoms he comes back to the ER.  CT of the abdomen pelvis shows nonobstructive 2 cm left-sided staghorn calculus, nonobstructive small left-sided nephrolithiasis.   Assessment & Plan:   Principal Problem:   Pyelonephritis Active Problems:   Depression   Hypertension   Menorrhagia with regular cycle   Malignant neoplasm of upper-outer quadrant of right female breast ([place])   Left flank pain with presumed pyelonephritis Left-sided staghorn calculi; 2cm NonObt Stone -Currently patient is on IV empiric Rocephin, may need to change to fluoroquinolones but previously Klebsiella had been pansensitive therefore we will clinically monitor on Rocephin - Consulted urology, she is supposed to get outpatient procedure on December 16 for this renal stone at [facility] input from urology here.  - Supportive care, IV fluids, pain control  Essential hypertension -On Avapro, atenolol  Anxiety disorder -Continue home meds  History of SMA thrombosis - Treated with use of Eliquis.  Now on aspirin Plavix follows outpatient vascular   DVT prophylaxis: SCDs Code Status: Full Family Communication:    Status is: Inpatient  Remains inpatient appropriate because: Patient is still having significant amount of abdominal pain and is worried that it will return.  Continue IV antibiotics at this time in the hospital.  Subjective: States her  pain is slightly better compared to yesterday but still having a lot of discomfort.  Denies any nausea or vomiting.  Review of Systems Otherwise negative except as per HPI, including: General: Denies fever, chills, night sweats or unintended weight loss. Resp: Denies cough, wheezing, shortness of breath. Cardiac: Denies chest pain, palpitations, orthopnea, paroxysmal nocturnal dyspnea. GI: Denies abdominal pain, nausea, vomiting, diarrhea or constipation GU: Denies hesitancy or incontinence MS: Denies muscle aches, joint pain or swelling Neuro: Denies headache, neurologic deficits (focal weakness, numbness, tingling), abnormal gait Psych: Denies anxiety, depression, SI/HI/AVH Skin: Denies new rashes or lesions ID: Denies sick contacts, exotic exposures, travel  Examination: Constitutional: Not in acute distress Respiratory: Clear to auscultation bilaterally Cardiovascular: Normal sinus rhythm, no rubs Abdomen: Nontender nondistended good bowel sounds Musculoskeletal: No edema noted Skin: No rashes seen Neurologic: CN 2-12 grossly intact.  And nonfocal Psychiatric: Normal judgment and insight. Alert and oriented x 3. Normal mood.     Objective: Vitals:   04/20/21 1329 04/20/21 2024 04/21/21 0224 04/21/21 0534  BP: (!) 148/87 139/72 (!) 141/82 132/77  Pulse: 77 69 73 66  Resp: 20 18 18 18   Temp: 98.4 F (36.9 C) 98.7 F (37.1 C) 98.4 F (36.9 C) 98.6 F (37 C)  TempSrc: Oral Oral Oral Oral  SpO2: 99% 100% 98% 98%    Intake/Output Summary (Last 24 hours) at 04/21/2021 0845 Last data filed at 04/21/2021 0600 Gross per 24 hour  Intake 2156.01 ml  Output 1300 ml  Net 856.01 ml   There were no vitals filed for this visit.   Data Reviewed:   CBC: Recent Labs  Lab 04/19/21 1918 04/20/21 1401  04/21/21 0433  WBC 13.0* 10.5 8.9  NEUTROABS 8.9* 7.0  --   HGB 12.9 13.7 11.1*  HCT 39.2 40.7 34.4*  MCV 84.5 84.8 88.0  PLT 377 369 161   Basic Metabolic  Panel: Recent Labs  Lab 04/19/21 1918 04/20/21 1401 04/21/21 0433  NA 140 138 138  K 4.3 4.1 4.1  CL 105 106 106  CO2 22 21* 20*  GLUCOSE 153* 106* 85  BUN 22* 24* 26*  CREATININE 0.97 0.91 0.92  CALCIUM 9.9 9.3 8.4*  MG  --   --  2.2  PHOS  --   --  4.7*   GFR: CrCl cannot be calculated (Unknown ideal weight.). Liver Function Tests: Recent Labs  Lab 04/20/21 1401 04/21/21 0433  AST 41 31  ALT 41 30  ALKPHOS 53 45  BILITOT 0.3 0.4  PROT 7.7 6.1*  ALBUMIN 3.9 3.3*   No results for input(s): LIPASE, AMYLASE in the last 168 hours. No results for input(s): AMMONIA in the last 168 hours. Coagulation Profile: No results for input(s): INR, PROTIME in the last 168 hours. Cardiac Enzymes: No results for input(s): CKTOTAL, CKMB, CKMBINDEX, TROPONINI in the last 168 hours. BNP (last 3 results) No results for input(s): PROBNP in the last 8760 hours. HbA1C: No results for input(s): HGBA1C in the last 72 hours. CBG: No results for input(s): GLUCAP in the last 168 hours. Lipid Profile: No results for input(s): CHOL, HDL, LDLCALC, TRIG, CHOLHDL, LDLDIRECT in the last 72 hours. Thyroid Function Tests: No results for input(s): TSH, T4TOTAL, FREET4, T3FREE, THYROIDAB in the last 72 hours. Anemia Panel: No results for input(s): VITAMINB12, FOLATE, FERRITIN, TIBC, IRON, RETICCTPCT in the last 72 hours. Sepsis Labs: No results for input(s): PROCALCITON, LATICACIDVEN in the last 168 hours.  Recent Results (from the past 240 hour(s))  Urine Culture     Status: Abnormal (Preliminary result)   Collection Time: 04/19/21  7:46 PM   Specimen: Urine, Clean Catch  Result Value Ref Range Status   Specimen Description   Final    URINE, CLEAN CATCH Performed at [facility], [address], [place], [place] [address]    Special Requests   Final    NONE Performed at [facility], [address] [place], [place] [address]    Culture (A)  Final     >=100,000 COLONIES/mL GRAM NEGATIVE RODS SUSCEPTIBILITIES TO FOLLOW Performed at [facility] [address] [place],  [address]    Report Status PENDING  Incomplete  Resp Panel by RT-PCR (Flu A&B, Covid) Nasopharyngeal Swab     Status: None   Collection Time: 04/20/21  5:38 PM   Specimen: Nasopharyngeal Swab; Nasopharyngeal(NP) swabs in vial transport medium  Result Value Ref Range Status   SARS Coronavirus 2 by RT PCR NEGATIVE NEGATIVE Final    Comment: (NOTE) SARS-CoV-2 target nucleic acids are NOT DETECTED.  The SARS-CoV-2 RNA is generally detectable in upper respiratory specimens during the acute phase of infection. The lowest concentration of SARS-CoV-2 viral copies this assay can detect is 138 copies/mL. A negative result does not preclude SARS-Cov-2 infection and should not be used as the sole basis for treatment or other patient management decisions. A negative result may occur with  improper specimen collection/handling, submission of specimen other than nasopharyngeal swab, presence of viral mutation(s) within the areas targeted by this assay, and inadequate number of viral copies(<138 copies/mL). A negative result must be combined with clinical observations, patient history, and epidemiological information.  The expected result is Negative.  Fact Sheet for Patients:  EntrepreneurPulse.com.au  Fact Sheet for Healthcare Providers:  IncredibleEmployment.be  This test is no t yet approved or cleared by the [place] FDA and  has been authorized for detection and/or diagnosis of SARS-CoV-2 by FDA under an Emergency Use Authorization (EUA). This EUA will remain  in effect (meaning this test can be used) for the duration of the COVID-19 declaration under Section 564(b)(1) of the Act, 21 U.S.C.section 360bbb-3(b)(1), unless the authorization is terminated  or revoked sooner.       Influenza A by PCR NEGATIVE NEGATIVE  Final   Influenza B by PCR NEGATIVE NEGATIVE Final    Comment: (NOTE) The Xpert Xpress SARS-CoV-2/FLU/RSV plus assay is intended as an aid in the diagnosis of influenza from Nasopharyngeal swab specimens and should not be used as a sole basis for treatment. Nasal washings and aspirates are unacceptable for Xpert Xpress SARS-CoV-2/FLU/RSV testing.  Fact Sheet for Patients: EntrepreneurPulse.com.au  Fact Sheet for Healthcare Providers: IncredibleEmployment.be  This test is not yet approved or cleared by the [place] FDA and has been authorized for detection and/or diagnosis of SARS-CoV-2 by FDA under an Emergency Use Authorization (EUA). This EUA will remain in effect (meaning this test can be used) for the duration of the COVID-19 declaration under Section 564(b)(1) of the Act, 21 U.S.C. section 360bbb-3(b)(1), unless the authorization is terminated or revoked.  Performed at [facility], [place] [address] [place], [place] [address]          Radiology Studies: CT Renal Stone Study  Result Date: 04/19/2021 CLINICAL DATA:  Flank pain. Large stone on the left. Schedule surgery as cannot passed stone on 05/11/2021. Patient has increased severe pain EXAM: CT ABDOMEN AND PELVIS WITHOUT CONTRAST TECHNIQUE: Multidetector CT imaging of the abdomen and pelvis was performed following the standard protocol without IV contrast. COMPARISON:  None. FINDINGS: Lower chest: No acute abnormality.  Coronary artery calcifications. Hepatobiliary: The liver is enlarged measuring up to 22 cm. The hepatic parenchyma is diffusely hypodense compared to the splenic parenchyma consistent with fatty infiltration. No focal liver abnormality. Pericholecystic focal fatty sparing. Contracted gallbladder. No gallstones, gallbladder wall thickening, or pericholecystic fluid. No biliary dilatation. Pancreas: No focal lesion. Normal pancreatic contour. No  surrounding inflammatory changes. No main pancreatic ductal dilatation. Spleen: Normal in size without focal abnormality. Adrenals/Urinary Tract: No adrenal nodule bilaterally. At least 2 cm left staghorn calculi. Several other smaller left renal stones noted. Query amorphous 4 mm calcified stone within the right kidney. No hydronephrosis. No definite contour-deforming renal mass. Scarred and atrophic left kidney. No ureterolithiasis or hydroureter. The urinary bladder is unremarkable. Stomach/Bowel: Stomach is within normal limits. No evidence of bowel wall thickening or dilatation. Appendix appears normal. Vascular/Lymphatic: SMA origin stent with limited evaluation this noncontrast study no abdominal aorta or iliac aneurysm. Severe atherosclerotic plaque of the aorta and its branches. No abdominal, pelvic, or inguinal lymphadenopathy. Reproductive: There is a 3 x 2.1 cm right ovarian cystic lesion. No follow-up imaging recommended. Note: This recommendation does not apply to premenarchal patients and to those with increased risk (genetic, family history, elevated tumor markers or other high-risk factors) of ovarian cancer. Reference: JACR 2020 Feb; 17(2):248-254. Otherwise uterus and bilateral adnexa are unremarkable. Other: No intraperitoneal free fluid. No intraperitoneal free gas. No organized fluid collection. Musculoskeletal: No abdominal wall hernia or abnormality. No suspicious lytic or blastic osseous lesions. No acute displaced fracture. Multilevel degenerative changes of the spine. IMPRESSION:  1. Nonobstructive 2 cm left staghorn calculus. Other nonobstructive smaller left nephrolithiasis. Query post 4 mm right nephrolithiasis. No definite findings to suggest superimposed infection; however, limited evaluation on this noncontrast study. 2. Hepatomegaly and hepatic steatosis. 3.  Aortic Atherosclerosis (ICD10-I70.0). Electronically Signed   By: Iven Finn M.D.   On: 04/19/2021 20:16         Scheduled Meds:  aspirin EC  81 mg Oral Daily   atenolol  100 mg Oral Daily   clopidogrel  75 mg Oral Daily   docusate sodium  100 mg Oral BID   gabapentin  300 mg Oral TID   irbesartan  37.5 mg Oral Daily   rOPINIRole  2-4 mg Oral QHS   tamoxifen  20 mg Oral QHS   Continuous Infusions:  sodium chloride 100 mL/hr at 04/21/21 0532   cefTRIAXone (ROCEPHIN)  IV       LOS: 1 day   Time spent= 35 mins    Aithana Kushner Arsenio Loader, MD [facility]  If 7PM-7AM, please contact night-coverage  04/21/2021, 8:45 AM

## 2021-04-21 NOTE — Consult Note (Signed)
Urology Consult   Physician requesting consult: Shawna Clamp, MD  Reason for consult: Left flank pain  History of Present Illness: Susan Benton is a 50 y.o. who follows with Dr. Amalia Hailey at [facility] for a left renal stone. She was last seen in his office on 04/09/2021 with a symptomatic nonobstructing left renal stone with no hydroureteronephrosis.  She elected to proceed with ureteroscopy with laser lithotripsy and at procedure is scheduled for 05/11/2021.  She states that she developed worsening left flank pain 4 days ago on Tuesday that was worse at night and was associated with some nausea.  She said that her temperature maximum is 99.  She did not have any fevers or chills.  She did have some increased urinary frequency.  She denied dysuria.  She does state that she has been diagnosed with recurrent urinary tract infections and recently completed a course of antibiotics for recurrent cystitis.  Repeat CT A/P 04/19/2021 demonstrated nonobstructive 2 cm left staghorn calculus with other nonobstructive smaller left-sided stones as well as a possible 4 mm right nonobstructing renal stone.  There is no hydronephrosis.  There was no radiologic findings of pyelonephritis.  The ED provider spoke with [facility] urology and was recommended to start vantin for Klebsiella UTI. She was discharged home from the ED and represented on 11/25 with recurrent left-sided flank pain.  Her pain is now well controlled with Toradol and oxycodone.  She denies fevers or chills.  She denies dysuria.  Urine culture 04/19/2021 resulted rhythm 100,000 Klebsiella, sensitivities pending.  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  She did complete a course of Eliquis for 2 years.  Past Medical History:  Diagnosis Date   Anxiety    Arthritis    Depression 2013   Fibroids, submucosal 10/04/2016   Headache    Hep C w/o coma, chronic ([place]) 06/27/2014   had  treatment    Hx of right and left heart catheterization 2014   had cath in [place], no intervention   Hypertension 1987   Invasive ductal carcinoma of breast ([place]) 10/14/2016   Low iron    Restless legs syndrome     Past Surgical History:  Procedure Laterality Date   AXILLARY SENTINEL NODE BIOPSY Right 11/20/2016   Procedure: AXILLARY SENTINEL NODE BIOPSY;  Surgeon: Aviva Signs, MD;  Location: [facility];  Service: General;  Laterality: Right;  Sentinel Node Injection @ 10:30am   BREAST RECONSTRUCTION WITH PLACEMENT OF TISSUE EXPANDER AND FLEX HD (ACELLULAR HYDRATED DERMIS) Bilateral 12/25/2016   Procedure: BREAST RECONSTRUCTION WITH PLACEMENT OF TISSUE EXPANDER AND FLEX HD (ACELLULAR HYDRATED DERMIS);  Surgeon: Wallace Going, DO;  Location: [place];  Service: Plastics;  Laterality: Bilateral;   CARDIAC CATHETERIZATION     DILITATION & CURRETTAGE/HYSTROSCOPY WITH NOVASURE ABLATION N/A 10/23/2016   Procedure: DILATATION & CURETTAGE/HYSTEROSCOPY WITH NOVASURE ENDOMETRIAL ABLATION;  Surgeon: Florian Buff, MD;  Location: [facility];  Service: Gynecology;  Laterality: N/A;   MASTECTOMY MODIFIED RADICAL Right 10/23/2016   Procedure: MASTECTOMY MODIFIED RADICAL;  Surgeon: Aviva Signs, MD;  Location: [facility];  Service: General;  Laterality: Right;   SIMPLE MASTECTOMY WITH AXILLARY SENTINEL NODE BIOPSY Left 10/23/2016   Procedure: SIMPLE MASTECTOMY;  Surgeon: Aviva Signs, MD;  Location: [facility];  Service: General;  Laterality: Left;   TISSUE EXPANDER PLACEMENT Bilateral 02/27/2017   Procedure: REMOVAL TISSUE EXPANDER;  Surgeon: Wallace Going, DO;  Location: [place];  Service: Plastics;  Laterality: Bilateral;   TONSILECTOMY, ADENOIDECTOMY, BILATERAL MYRINGOTOMY AND TUBES Bilateral [facility] Medications:  Home meds:  No current facility-administered medications on file prior to encounter.   Current Outpatient Medications on  File Prior to Encounter  Medication Sig Dispense Refill   ALPRAZolam (XANAX) 0.5 MG tablet TAKE 1 TABLET BY MOUTH TWICE A DAY AS NEEDED FOR ANXIETY (Patient taking differently: Take 0.5 mg by mouth 2 (two) times daily.) 30 tablet 0   aspirin 81 MG EC tablet Take 81 mg by mouth daily.     atenolol (TENORMIN) 100 MG tablet Take 1 tablet (100 mg total) by mouth daily. 30 tablet 1   azithromycin (ZITHROMAX) 250 MG tablet Take 250-500 mg by mouth as directed. Take 2 tablets (500 mg) on Day 1 Then Take 1 tablet (250 mg) on Days 2-5     clopidogrel (PLAVIX) 75 MG tablet Take 75 mg by mouth daily.     gabapentin (NEURONTIN) 300 MG capsule TAKE ONE CAPSULE BY MOUTH 3 TIMES A DAY (Patient taking differently: Take 600 mg by mouth at bedtime.) 90 capsule 2   olmesartan (BENICAR) 40 MG tablet Take 40 mg by mouth daily.     oxyCODONE (ROXICODONE) 15 MG immediate release tablet Take 15 mg by mouth in the morning, at noon, and at bedtime.     rOPINIRole (REQUIP) 2 MG tablet 1-2 tabs po qhs (Patient taking differently: Take 4 mg by mouth at bedtime.) 60 tablet 3   solifenacin (VESICARE) 10 MG tablet Take 10 mg by mouth daily.     tamoxifen (NOLVADEX) 20 MG tablet TAKE 1 TABLET BY MOUTH EVERYDAY AT BEDTIME (Patient taking differently: Take 20 mg by mouth at bedtime.) 90 tablet 0   cefpodoxime (VANTIN) 200 MG tablet Take 1 tablet (200 mg total) by mouth 2 (two) times daily. 7 tablet 0   ELIQUIS 5 MG TABS tablet Take 1 tablet (5 mg total) by mouth 2 (two) times daily. (Patient not taking: Reported on 04/20/2021) 60 tablet 3   methocarbamol (ROBAXIN) 500 MG tablet Take 1 tablet (500 mg total) by mouth at bedtime as needed for muscle spasms. (Patient not taking: Reported on 04/20/2021) 10 tablet 0     Scheduled Meds:  aspirin EC  81 mg Oral Daily   atenolol  100 mg Oral Daily   clopidogrel  75 mg Oral Daily   docusate sodium  100 mg Oral BID   gabapentin  300 mg Oral TID   irbesartan  37.5 mg Oral Daily    rOPINIRole  2-4 mg Oral QHS   tamoxifen  20 mg Oral QHS   Continuous Infusions:  sodium chloride 100 mL/hr at 04/21/21 0532   cefTRIAXone (ROCEPHIN)  IV     PRN Meds:.acetaminophen **OR** acetaminophen, ALPRAZolam, ketorolac, methocarbamol, ondansetron **OR** ondansetron (ZOFRAN) IV, oxyCODONE  Allergies:  Allergies  Allergen Reactions   Tramadol Other (See Comments)    Migraine headaches     Family History  Problem Relation Age of Onset   Hypertension Mother    Cancer Mother    Diabetes Father    Hypertension Father    Kidney disease Father    Hyperlipidemia Father    Asthma Father    COPD Father    Heart disease Father    HIV/AIDS Brother    Cancer Maternal Grandmother        breast  Breast cancer Maternal Grandmother    Hypertension Son     Social History:  reports that she has been smoking cigarettes. She has a 5.00 pack-year smoking history. She has never used smokeless tobacco. She reports that she does not drink alcohol and does not use drugs.  ROS: A complete review of systems was performed.  All systems are negative except for pertinent findings as noted.  Physical Exam:  Vital signs in last 24 hours: Temp:  [98.2 F (36.8 C)-98.7 F (37.1 C)] 98.2 F (36.8 C) (11/26 0953) Pulse Rate:  [66-77] 67 (11/26 0953) Resp:  [14-20] 14 (11/26 0953) BP: (132-148)/(71-87) 132/71 (11/26 0953) SpO2:  [98 %-100 %] 98 % (11/26 0953) Weight:  [100.7 kg] 100.7 kg (11/26 0853) Constitutional:  Alert and oriented, No acute distress Cardiovascular: Regular rate and rhythm Respiratory: Normal respiratory effort, Lungs clear bilaterally GI: Abdomen is soft, nontender, nondistended, no abdominal masses GU: Mild L CVA tenderness Neurologic: Grossly intact, no focal deficits Psychiatric: Normal mood and affect  Laboratory Data:  Recent Labs    04/19/21 1918 04/20/21 1401 04/21/21 0433  WBC 13.0* 10.5 8.9  HGB 12.9 13.7 11.1*  HCT 39.2 40.7 34.4*  PLT 377 369 236     Recent Labs    04/19/21 1918 04/20/21 1401 04/21/21 0433  NA 140 138 138  K 4.3 4.1 4.1  CL 105 106 106  GLUCOSE 153* 106* 85  BUN 22* 24* 26*  CALCIUM 9.9 9.3 8.4*  CREATININE 0.97 0.91 0.92     Results for orders placed or performed during the hospital encounter of 04/20/21 (from the past 24 hour(s))  CBC with Differential     Status: None   Collection Time: 04/20/21  2:01 PM  Result Value Ref Range   WBC 10.5 4.0 - 10.5 K/uL   RBC 4.80 3.87 - 5.11 MIL/uL   Hemoglobin 13.7 12.0 - 15.0 g/dL   HCT 40.7 36.0 - 46.0 %   MCV 84.8 80.0 - 100.0 fL   MCH 28.5 26.0 - 34.0 pg   MCHC 33.7 30.0 - 36.0 g/dL   RDW 12.8 11.5 - 15.5 %   Platelets 369 150 - 400 K/uL   nRBC 0.0 0.0 - 0.2 %   Neutrophils Relative % 67 %   Neutro Abs 7.0 1.7 - 7.7 K/uL   Lymphocytes Relative 24 %   Lymphs Abs 2.5 0.7 - 4.0 K/uL   Monocytes Relative 5 %   Monocytes Absolute 0.6 0.1 - 1.0 K/uL   Eosinophils Relative 3 %   Eosinophils Absolute 0.3 0.0 - 0.5 K/uL   Basophils Relative 1 %   Basophils Absolute 0.1 0.0 - 0.1 K/uL   Immature Granulocytes 0 %   Abs Immature Granulocytes 0.03 0.00 - 0.07 K/uL  Comprehensive metabolic panel     Status: Abnormal   Collection Time: 04/20/21  2:01 PM  Result Value Ref Range   Sodium 138 135 - 145 mmol/L   Potassium 4.1 3.5 - 5.1 mmol/L   Chloride 106 98 - 111 mmol/L   CO2 21 (L) 22 - 32 mmol/L   Glucose, Bld 106 (H) 70 - 99 mg/dL   BUN 24 (H) 6 - 20 mg/dL   Creatinine, Ser 0.91 0.44 - 1.00 mg/dL   Calcium 9.3 8.9 - 10.3 mg/dL   Total Protein 7.7 6.5 - 8.1 g/dL   Albumin 3.9 3.5 - 5.0 g/dL   AST 41 15 - 41 U/L   ALT 41 0 - 44 U/L  Alkaline Phosphatase 53 38 - 126 U/L   Total Bilirubin 0.3 0.3 - 1.2 mg/dL   GFR, Estimated >60 >60 mL/min   Anion gap 11 5 - 15  Urinalysis, Routine w reflex microscopic     Status: Abnormal   Collection Time: 04/20/21  2:01 PM  Result Value Ref Range   Color, Urine YELLOW YELLOW   APPearance HAZY (A) CLEAR    Specific Gravity, Urine 1.017 1.005 - 1.030   pH 6.0 5.0 - 8.0   Glucose, UA NEGATIVE NEGATIVE mg/dL   Hgb urine dipstick SMALL (A) NEGATIVE   Bilirubin Urine NEGATIVE NEGATIVE   Ketones, ur NEGATIVE NEGATIVE mg/dL   Protein, ur NEGATIVE NEGATIVE mg/dL   Nitrite NEGATIVE NEGATIVE   Leukocytes,Ua LARGE (A) NEGATIVE   RBC / HPF 6-10 0 - 5 RBC/hpf   WBC, UA >50 (H) 0 - 5 WBC/hpf   Bacteria, UA RARE (A) NONE SEEN   Squamous Epithelial / LPF 0-5 0 - 5   Mucus PRESENT   Resp Panel by RT-PCR (Flu A&B, Covid) Nasopharyngeal Swab     Status: None   Collection Time: 04/20/21  5:38 PM   Specimen: Nasopharyngeal Swab; Nasopharyngeal(NP) swabs in vial transport medium  Result Value Ref Range   SARS Coronavirus 2 by RT PCR NEGATIVE NEGATIVE   Influenza A by PCR NEGATIVE NEGATIVE   Influenza B by PCR NEGATIVE NEGATIVE  Comprehensive metabolic panel     Status: Abnormal   Collection Time: 04/21/21  4:33 AM  Result Value Ref Range   Sodium 138 135 - 145 mmol/L   Potassium 4.1 3.5 - 5.1 mmol/L   Chloride 106 98 - 111 mmol/L   CO2 20 (L) 22 - 32 mmol/L   Glucose, Bld 85 70 - 99 mg/dL   BUN 26 (H) 6 - 20 mg/dL   Creatinine, Ser 0.92 0.44 - 1.00 mg/dL   Calcium 8.4 (L) 8.9 - 10.3 mg/dL   Total Protein 6.1 (L) 6.5 - 8.1 g/dL   Albumin 3.3 (L) 3.5 - 5.0 g/dL   AST 31 15 - 41 U/L   ALT 30 0 - 44 U/L   Alkaline Phosphatase 45 38 - 126 U/L   Total Bilirubin 0.4 0.3 - 1.2 mg/dL   GFR, Estimated >60 >60 mL/min   Anion gap 12 5 - 15  CBC     Status: Abnormal   Collection Time: 04/21/21  4:33 AM  Result Value Ref Range   WBC 8.9 4.0 - 10.5 K/uL   RBC 3.91 3.87 - 5.11 MIL/uL   Hemoglobin 11.1 (L) 12.0 - 15.0 g/dL   HCT 34.4 (L) 36.0 - 46.0 %   MCV 88.0 80.0 - 100.0 fL   MCH 28.4 26.0 - 34.0 pg   MCHC 32.3 30.0 - 36.0 g/dL   RDW 13.2 11.5 - 15.5 %   Platelets 236 150 - 400 K/uL   nRBC 0.0 0.0 - 0.2 %  Magnesium     Status: None   Collection Time: 04/21/21  4:33 AM  Result Value Ref Range    Magnesium 2.2 1.7 - 2.4 mg/dL  Phosphorus     Status: Abnormal   Collection Time: 04/21/21  4:33 AM  Result Value Ref Range   Phosphorus 4.7 (H) 2.5 - 4.6 mg/dL   Recent Results (from the past 240 hour(s))  Urine Culture     Status: Abnormal (Preliminary result)   Collection Time: 04/19/21  7:46 PM   Specimen: Urine, Clean Catch  Result Value  Ref Range Status   Specimen Description   Final    URINE, CLEAN CATCH Performed at [facility], [address] [place], [place] [address]    Special Requests   Final    NONE Performed at [facility], [address] [place], [place] [address]    Culture (A)  Final    >=100,000 COLONIES/mL KLEBSIELLA PNEUMONIAE SUSCEPTIBILITIES TO FOLLOW Performed at [facility], [place] [address], [place], [place] [address]    Report Status PENDING  Incomplete  Resp Panel by RT-PCR (Flu A&B, Covid) Nasopharyngeal Swab     Status: None   Collection Time: 04/20/21  5:38 PM   Specimen: Nasopharyngeal Swab; Nasopharyngeal(NP) swabs in vial transport medium  Result Value Ref Range Status   SARS Coronavirus 2 by RT PCR NEGATIVE NEGATIVE Final    Comment: (NOTE) SARS-CoV-2 target nucleic acids are NOT DETECTED.  The SARS-CoV-2 RNA is generally detectable in upper respiratory specimens during the acute phase of infection. The lowest concentration of SARS-CoV-2 viral copies this assay can detect is 138 copies/mL. A negative result does not preclude SARS-Cov-2 infection and should not be used as the sole basis for treatment or other patient management decisions. A negative result may occur with  improper specimen collection/handling, submission of specimen other than nasopharyngeal swab, presence of viral mutation(s) within the areas targeted by this assay, and inadequate number of viral copies(<138 copies/mL). A negative result must be combined with clinical observations, patient history, and  epidemiological information. The expected result is Negative.  Fact Sheet for Patients:  EntrepreneurPulse.com.au  Fact Sheet for Healthcare Providers:  IncredibleEmployment.be  This test is no t yet approved or cleared by the [place] FDA and  has been authorized for detection and/or diagnosis of SARS-CoV-2 by FDA under an Emergency Use Authorization (EUA). This EUA will remain  in effect (meaning this test can be used) for the duration of the COVID-19 declaration under Section 564(b)(1) of the Act, 21 U.S.C.section 360bbb-3(b)(1), unless the authorization is terminated  or revoked sooner.       Influenza A by PCR NEGATIVE NEGATIVE Final   Influenza B by PCR NEGATIVE NEGATIVE Final    Comment: (NOTE) The Xpert Xpress SARS-CoV-2/FLU/RSV plus assay is intended as an aid in the diagnosis of influenza from Nasopharyngeal swab specimens and should not be used as a sole basis for treatment. Nasal washings and aspirates are unacceptable for Xpert Xpress SARS-CoV-2/FLU/RSV testing.  Fact Sheet for Patients: EntrepreneurPulse.com.au  Fact Sheet for Healthcare Providers: IncredibleEmployment.be  This test is not yet approved or cleared by the [place] FDA and has been authorized for detection and/or diagnosis of SARS-CoV-2 by FDA under an Emergency Use Authorization (EUA). This EUA will remain in effect (meaning this test can be used) for the duration of the COVID-19 declaration under Section 564(b)(1) of the Act, 21 U.S.C. section 360bbb-3(b)(1), unless the authorization is terminated or revoked.  Performed at [facility], [place] [address] [place],  [address]     Renal Function: Recent Labs    04/19/21 1918 04/20/21 1401 04/21/21 0433  CREATININE 0.97 0.91 0.92   Estimated Creatinine Clearance: 91.4 mL/min (by C-G formula based on SCr of 0.92 mg/dL).  Radiologic  Imaging: CT Renal Stone Study  Result Date: 04/19/2021 CLINICAL DATA:  Flank pain. Large stone on the left. Schedule surgery as cannot passed stone on 05/11/2021. Patient has increased severe pain EXAM: CT ABDOMEN AND PELVIS WITHOUT CONTRAST TECHNIQUE: Multidetector CT imaging of  the abdomen and pelvis was performed following the standard protocol without IV contrast. COMPARISON:  None. FINDINGS: Lower chest: No acute abnormality.  Coronary artery calcifications. Hepatobiliary: The liver is enlarged measuring up to 22 cm. The hepatic parenchyma is diffusely hypodense compared to the splenic parenchyma consistent with fatty infiltration. No focal liver abnormality. Pericholecystic focal fatty sparing. Contracted gallbladder. No gallstones, gallbladder wall thickening, or pericholecystic fluid. No biliary dilatation. Pancreas: No focal lesion. Normal pancreatic contour. No surrounding inflammatory changes. No main pancreatic ductal dilatation. Spleen: Normal in size without focal abnormality. Adrenals/Urinary Tract: No adrenal nodule bilaterally. At least 2 cm left staghorn calculi. Several other smaller left renal stones noted. Query amorphous 4 mm calcified stone within the right kidney. No hydronephrosis. No definite contour-deforming renal mass. Scarred and atrophic left kidney. No ureterolithiasis or hydroureter. The urinary bladder is unremarkable. Stomach/Bowel: Stomach is within normal limits. No evidence of bowel wall thickening or dilatation. Appendix appears normal. Vascular/Lymphatic: SMA origin stent with limited evaluation this noncontrast study no abdominal aorta or iliac aneurysm. Severe atherosclerotic plaque of the aorta and its branches. No abdominal, pelvic, or inguinal lymphadenopathy. Reproductive: There is a 3 x 2.1 cm right ovarian cystic lesion. No follow-up imaging recommended. Note: This recommendation does not apply to premenarchal patients and to those with increased risk (genetic,  family history, elevated tumor markers or other high-risk factors) of ovarian cancer. Reference: JACR 2020 Feb; 17(2):248-254. Otherwise uterus and bilateral adnexa are unremarkable. Other: No intraperitoneal free fluid. No intraperitoneal free gas. No organized fluid collection. Musculoskeletal: No abdominal wall hernia or abnormality. No suspicious lytic or blastic osseous lesions. No acute displaced fracture. Multilevel degenerative changes of the spine. IMPRESSION: 1. Nonobstructive 2 cm left staghorn calculus. Other nonobstructive smaller left nephrolithiasis. Query post 4 mm right nephrolithiasis. No definite findings to suggest superimposed infection; however, limited evaluation on this noncontrast study. 2. Hepatomegaly and hepatic steatosis. 3.  Aortic Atherosclerosis (ICD10-I70.0). Electronically Signed   By: Iven Finn M.D.   On: 04/19/2021 20:16    I independently reviewed the above imaging studies.  Impression/Recommendation: Symptomatic non-obstructing 2cm left renal stone: Confirmed on CT A/P 04/19/2021.  No signs of hydroureteronephrosis.  She follows with Dr. Amalia Hailey at [facility] and has left ureteroscopy/laser lithotripsy scheduled for 05/11/2021. Recurrent urinary tract infection: Urine culture 04/19/2021 with greater than 100,000 Klebsiella.  -No indication for urgent left ureteral stent or nephrostomy tube placement without evidence of hydronephrosis or sign of pyelonephritis. She remains afebrile, with no leukocytosis, stable creatinine in normal range. -Her pain is currently well controlled with oxycodone.  As long as his pain is well controlled, she may discharge home with follow-up for planned left-sided ureteroscopy with laser lithotripsy with Dr. Amalia Hailey. -Continue Rocephin in house.  Upon discharge, continue Vantin per [facility] for her Klebsiella UTI. -Return precautions discussed including fevers, worsening flank pain.  Matt R. Doran Nestle  MD 04/21/2021, 10:29 AM  [facility]  Pager: [phone]   CC: Shawna Clamp, MD

## 2021-04-21 NOTE — Progress Notes (Signed)
Urine strained for k/s today = very small fine granules in the strainer. These are too small to put into a specimen cup, so these were left in the strainer in the pt's BR.

## 2021-04-22 DIAGNOSIS — N12 Tubulo-interstitial nephritis, not specified as acute or chronic: Secondary | ICD-10-CM | POA: Diagnosis not present

## 2021-04-22 LAB — URINE CULTURE: Culture: >=100000 — AB

## 2021-04-22 MED ORDER — TAMSULOSIN HCL 0.4 MG PO CAPS
0.4000 mg | ORAL_CAPSULE | Freq: Every day | ORAL | Status: DC
  Administered 2021-04-22 – 2021-04-23 (×2): 0.4 mg via ORAL
  Filled 2021-04-22 (×2): qty 1

## 2021-04-22 NOTE — Progress Notes (Signed)
  Subjective: Pain presently controlled this AM although she did say that she had worse pain last night. Afebrile. No nausea or emesis. Tolerating diet. Voiding without difficulty.  Objective: Vital signs in last 24 hours: Temp:  [97.5 F (36.4 C)-98.5 F (36.9 C)] 98.4 F (36.9 C) (11/27 1229) Pulse Rate:  [67-80] 76 (11/27 1229) Resp:  [14-19] 18 (11/27 1229) BP: (136-183)/(76-108) 163/98 (11/27 1229) SpO2:  [97 %-99 %] 99 % (11/27 1229)  Intake/Output from previous day: 11/26 0701 - 11/27 0700 In: 4558.2 [P.O.:2150; I.V.:2308.2; IV Piggyback:100] Out: 2100 [Urine:2100] Intake/Output this shift: Total I/O In: -  Out: 250 [Urine:250]  Physical Exam:  General: Alert and oriented CV: RRR Lungs: Clear Abdomen: Soft, ND, NT, mild L CVA tenderness. Ext: NT, No erythema  Lab Results: Recent Labs    04/19/21 1918 04/20/21 1401 04/21/21 0433  HGB 12.9 13.7 11.1*  HCT 39.2 40.7 34.4*   BMET Recent Labs    04/20/21 1401 04/21/21 0433  NA 138 138  K 4.1 4.1  CL 106 106  CO2 21* 20*  GLUCOSE 106* 85  BUN 24* 26*  CREATININE 0.91 0.92  CALCIUM 9.3 8.4*     Studies/Results: No results found.  Assessment/Plan: Symptomatic non-obstructing 2cm left renal stone: Confirmed on CT A/P 04/19/2021.  No signs of hydroureteronephrosis.  She follows with Dr. Amalia Hailey at [facility] and has left ureteroscopy/laser lithotripsy scheduled for 05/11/2021. Recurrent urinary tract infection: Urine culture 04/19/2021 with greater than 100,000 Klebsiella.  -Pt pain is currently well controlled. We did discuss that if pain is persistent, we could try ureteral stent or nephrostomy tube, however, the stone is non-obstructing and there is no proximal hydronephrosis and as such, this may not alleviate her pain. -Continue pain control, supportive care -Continue rocephin while in hospital. Upon discharge, continue Vantin per previous recommendation with [facility] for her Klebsiella  UTI. -She has L URS/LL scheduled with Dr. Amalia Hailey at [place] on 05/11/2021   LOS: 2 days   Matt R. Kenith Trickel MD 04/22/2021, 12:37 PM [facility]  Pager: [phone]

## 2021-04-22 NOTE — Progress Notes (Signed)
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  Initially she had gone to droppage ER where she was treated with IV Toradol, IV antibiotic and recommended admission but patient felt better and ended up going home on oral antibiotic and pain medicine.  But due to worsening of the symptoms he comes back to the ER.  CT of the abdomen pelvis shows nonobstructive 2 cm left-sided staghorn calculus, nonobstructive small left-sided nephrolithiasis.   Assessment & Plan:   Principal Problem:   Pyelonephritis Active Problems:   Depression   Hypertension   Menorrhagia with regular cycle   Malignant neoplasm of upper-outer quadrant of right female breast ([place])   Left flank pain with presumed pyelonephritis Left-sided staghorn calculi; 2cm NonObt Stone - Continue IV Rocephin.  Upon discharge will change to [facility] urology - recommends outptn follow up at [facility] with Dr Amalia Hailey.   - Supportive care, IV fluids, pain control. Add Flomax.   Essential hypertension -On Avapro, atenolol  Anxiety disorder -Continue home meds  History of SMA thrombosis - Treated with use of Eliquis.  Now on aspirin Plavix follows outpatient vascular   DVT prophylaxis: Place and maintain sequential compression device Start: 04/21/21 0858SCDs Code Status: Full Family Communication:    Status is: Inpatient  Remains inpatient appropriate because: Still having quite a bit of abd pain, only slightly improved from yesterday due to pain meds. Hopefully dc tomorrow.   Subjective: Pain little better with IV morphine. No fevers overnight.   Review of Systems Otherwise negative except as per HPI, including: General = no fevers, chills,  dizziness,  fatigue HEENT/EYES = negative for loss of vision, double vision, blurred vision,  sore throa Cardiovascular= negative for chest pain, palpitation Respiratory/lungs= negative for shortness of breath, cough, wheezing; hemoptysis,  Gastrointestinal= negative for nausea, vomiting, abdominal pain Genitourinary= negative for Dysuria MSK = Negative for arthralgia, myalgias Neurology= Negative for headache, numbness, tingling  Psychiatry= Negative for suicidal and homocidal ideation Skin= Negative for Rash   Examination: Constitutional: Not in acute distress Respiratory: Clear to auscultation bilaterally Cardiovascular: Normal sinus rhythm, no rubs Abdomen: Nontender nondistended good bowel sounds Musculoskeletal: No edema noted Skin: No rashes seen Neurologic: CN 2-12 grossly intact.  And nonfocal Psychiatric: Normal judgment and insight. Alert and oriented x 3. Normal mood.    Objective: Vitals:   04/21/21 2224 04/22/21 0222 04/22/21 0642 04/22/21 0910  BP: 138/86 (!) 148/102 (!) 183/76 (!) 181/108  Pulse: 73 77 72 80  Resp:  15 15 18   Temp: 97.8 F (36.6 C) 98.5 F (36.9 C) 98 F (36.7 C) 98.5 F (36.9 C)  TempSrc: Oral Oral Oral Oral  SpO2: 98% 99% 97% 97%  Weight:      Height:        Intake/Output Summary (Last 24 hours) at 04/22/2021 1029 Last data filed at 04/22/2021 1000 Gross per 24 hour  Intake 3558.27 ml  Output 1950 ml  Net 1608.27 ml   Filed Weights   04/21/21 0853  Weight: 100.7 kg     Data Reviewed:   CBC: Recent Labs  Lab 04/19/21 1918 04/20/21 1401 04/21/21 0433  WBC 13.0* 10.5 8.9  NEUTROABS 8.9* 7.0  --   HGB 12.9 13.7 11.1*  HCT  39.2 40.7 34.4*  MCV 84.5 84.8 88.0  PLT 377 369 196   Basic Metabolic Panel: Recent Labs  Lab 04/19/21 1918 04/20/21 1401 04/21/21 0433  NA 140 138 138  K 4.3 4.1 4.1  CL 105 106 106  CO2 22 21* 20*  GLUCOSE 153* 106* 85  BUN 22* 24* 26*  CREATININE 0.97 0.91 0.92  CALCIUM 9.9 9.3 8.4*   MG  --   --  2.2  PHOS  --   --  4.7*   GFR: Estimated Creatinine Clearance: 91.4 mL/min (by C-G formula based on SCr of 0.92 mg/dL). Liver Function Tests: Recent Labs  Lab 04/20/21 1401 04/21/21 0433  AST 41 31  ALT 41 30  ALKPHOS 53 45  BILITOT 0.3 0.4  PROT 7.7 6.1*  ALBUMIN 3.9 3.3*   No results for input(s): LIPASE, AMYLASE in the last 168 hours. No results for input(s): AMMONIA in the last 168 hours. Coagulation Profile: No results for input(s): INR, PROTIME in the last 168 hours. Cardiac Enzymes: No results for input(s): CKTOTAL, CKMB, CKMBINDEX, TROPONINI in the last 168 hours. BNP (last 3 results) No results for input(s): PROBNP in the last 8760 hours. HbA1C: No results for input(s): HGBA1C in the last 72 hours. CBG: No results for input(s): GLUCAP in the last 168 hours. Lipid Profile: No results for input(s): CHOL, HDL, LDLCALC, TRIG, CHOLHDL, LDLDIRECT in the last 72 hours. Thyroid Function Tests: No results for input(s): TSH, T4TOTAL, FREET4, T3FREE, THYROIDAB in the last 72 hours. Anemia Panel: No results for input(s): VITAMINB12, FOLATE, FERRITIN, TIBC, IRON, RETICCTPCT in the last 72 hours. Sepsis Labs: No results for input(s): PROCALCITON, LATICACIDVEN in the last 168 hours.  Recent Results (from the past 240 hour(s))  Urine Culture     Status: Abnormal   Collection Time: 04/19/21  7:46 PM   Specimen: Urine, Clean Catch  Result Value Ref Range Status   Specimen Description   Final    URINE, CLEAN CATCH Performed at [facility], [address], [place], [place] [address]    Special Requests   Final    NONE Performed at [facility], [address] [place], [place] [address]    Culture >=100,000 COLONIES/mL KLEBSIELLA PNEUMONIAE (A)  Final   Report Status 04/22/2021 FINAL  Final   Organism ID, Bacteria KLEBSIELLA PNEUMONIAE (A)  Final      Susceptibility   Klebsiella pneumoniae - MIC*    AMPICILLIN >=32  RESISTANT Resistant     CEFAZOLIN <=4 SENSITIVE Sensitive     CEFEPIME <=0.12 SENSITIVE Sensitive     CEFTRIAXONE <=0.25 SENSITIVE Sensitive     CIPROFLOXACIN <=0.25 SENSITIVE Sensitive     GENTAMICIN <=1 SENSITIVE Sensitive     IMIPENEM <=0.25 SENSITIVE Sensitive     NITROFURANTOIN 64 INTERMEDIATE Intermediate     TRIMETH/SULFA <=20 SENSITIVE Sensitive     AMPICILLIN/SULBACTAM 4 SENSITIVE Sensitive     PIP/TAZO <=4 SENSITIVE Sensitive     * >=100,000 COLONIES/mL KLEBSIELLA PNEUMONIAE  Resp Panel by RT-PCR (Flu A&B, Covid) Nasopharyngeal Swab     Status: None   Collection Time: 04/20/21  5:38 PM   Specimen: Nasopharyngeal Swab; Nasopharyngeal(NP) swabs in vial transport medium  Result Value Ref Range Status   SARS Coronavirus 2 by RT PCR NEGATIVE NEGATIVE Final    Comment: (NOTE) SARS-CoV-2 target nucleic acids are NOT DETECTED.  The SARS-CoV-2 RNA is generally detectable in upper respiratory specimens during the acute phase of infection. The lowest concentration of SARS-CoV-2  viral copies this assay can detect is 138 copies/mL. A negative result does not preclude SARS-Cov-2 infection and should not be used as the sole basis for treatment or other patient management decisions. A negative result may occur with  improper specimen collection/handling, submission of specimen other than nasopharyngeal swab, presence of viral mutation(s) within the areas targeted by this assay, and inadequate number of viral copies(<138 copies/mL). A negative result must be combined with clinical observations, patient history, and epidemiological information. The expected result is Negative.  Fact Sheet for Patients:  EntrepreneurPulse.com.au  Fact Sheet for Healthcare Providers:  IncredibleEmployment.be  This test is no t yet approved or cleared by the [place] FDA and  has been authorized for detection and/or diagnosis of SARS-CoV-2 by FDA under an  Emergency Use Authorization (EUA). This EUA will remain  in effect (meaning this test can be used) for the duration of the COVID-19 declaration under Section 564(b)(1) of the Act, 21 U.S.C.section 360bbb-3(b)(1), unless the authorization is terminated  or revoked sooner.       Influenza A by PCR NEGATIVE NEGATIVE Final   Influenza B by PCR NEGATIVE NEGATIVE Final    Comment: (NOTE) The Xpert Xpress SARS-CoV-2/FLU/RSV plus assay is intended as an aid in the diagnosis of influenza from Nasopharyngeal swab specimens and should not be used as a sole basis for treatment. Nasal washings and aspirates are unacceptable for Xpert Xpress SARS-CoV-2/FLU/RSV testing.  Fact Sheet for Patients: EntrepreneurPulse.com.au  Fact Sheet for Healthcare Providers: IncredibleEmployment.be  This test is not yet approved or cleared by the [place] FDA and has been authorized for detection and/or diagnosis of SARS-CoV-2 by FDA under an Emergency Use Authorization (EUA). This EUA will remain in effect (meaning this test can be used) for the duration of the COVID-19 declaration under Section 564(b)(1) of the Act, 21 U.S.C. section 360bbb-3(b)(1), unless the authorization is terminated or revoked.  Performed at [facility], [place] [address], [place], [place] [address]          Radiology Studies: No results found.      Scheduled Meds:  aspirin EC  81 mg Oral Daily   atenolol  100 mg Oral Daily   clopidogrel  75 mg Oral Daily   docusate sodium  100 mg Oral BID   gabapentin  300 mg Oral TID   irbesartan  37.5 mg Oral Daily   rOPINIRole  2-4 mg Oral QHS   tamoxifen  20 mg Oral QHS   tamsulosin  0.4 mg Oral QPC breakfast   Continuous Infusions:  sodium chloride 100 mL/hr at 04/22/21 0118   cefTRIAXone (ROCEPHIN)  IV 1 g (04/21/21 1829)     LOS: 2 days   Time spent= 35 mins    Renner Sebald Arsenio Loader, MD [facility]  If  7PM-7AM, please contact night-coverage  04/22/2021, 10:29 AM

## 2021-04-23 DIAGNOSIS — N12 Tubulo-interstitial nephritis, not specified as acute or chronic: Secondary | ICD-10-CM | POA: Diagnosis not present

## 2021-04-23 LAB — BASIC METABOLIC PANEL
Anion gap: 8 (ref 5–15)
BUN: 17 mg/dL (ref 6–20)
CO2: 21 mmol/L — ABNORMAL LOW (ref 22–32)
Calcium: 8.4 mg/dL — ABNORMAL LOW (ref 8.9–10.3)
Chloride: 113 mmol/L — ABNORMAL HIGH (ref 98–111)
Creatinine, Ser: 0.63 mg/dL (ref 0.44–1.00)
GFR, Estimated: >60 mL/min (ref >60)
Glucose, Bld: 100 mg/dL — ABNORMAL HIGH (ref 70–99)
Potassium: 4.1 mmol/L (ref 3.5–5.1)
Sodium: 142 mmol/L (ref 135–145)

## 2021-04-23 LAB — CBC
HCT: 34.3 % — ABNORMAL LOW (ref 36.0–46.0)
Hemoglobin: 11.4 g/dL — ABNORMAL LOW (ref 12.0–15.0)
MCH: 28.4 pg (ref 26.0–34.0)
MCHC: 33.2 g/dL (ref 30.0–36.0)
MCV: 85.3 fL (ref 80.0–100.0)
Platelets: 296 10*3/uL (ref 150–400)
RBC: 4.02 MIL/uL (ref 3.87–5.11)
RDW: 12.6 % (ref 11.5–15.5)
WBC: 10 10*3/uL (ref 4.0–10.5)
nRBC: 0 % (ref 0.0–0.2)

## 2021-04-23 LAB — MAGNESIUM: Magnesium: 2 mg/dL (ref 1.7–2.4)

## 2021-04-23 MED ORDER — TAMSULOSIN HCL 0.4 MG PO CAPS
0.4000 mg | ORAL_CAPSULE | Freq: Every day | ORAL | 0 refills | Status: AC
Start: 2021-04-23 — End: 2021-05-23

## 2021-04-23 MED ORDER — METHOCARBAMOL 500 MG PO TABS: 500.0000 mg | ORAL_TABLET | Freq: Every evening | ORAL | 0 refills | Status: DC | PRN

## 2021-04-23 MED ORDER — FLUCONAZOLE 150 MG PO TABS: 150.0000 mg | ORAL_TABLET | Freq: Once | ORAL | 0 refills | Status: AC

## 2021-04-23 MED ORDER — CEFPODOXIME PROXETIL 200 MG PO TABS: 200.0000 mg | ORAL_TABLET | Freq: Two times a day (BID) | ORAL | 0 refills | Status: AC

## 2021-04-23 MED ORDER — OXYCODONE HCL 15 MG PO TABS: 15.0000 mg | ORAL_TABLET | Freq: Four times a day (QID) | ORAL | 0 refills | Status: DC | PRN

## 2021-04-23 NOTE — Discharge Summary (Signed)
Physician Discharge Summary  Susan Benton IRJ:[id number] DOB: 11/04/1970 DOA: 04/20/2021  PCP: Celene Squibb, MD  Admit date: 04/20/2021 Discharge date: 04/23/2021  Admitted From: Home Disposition:  Home  Recommendations for Outpatient Follow-up:  Follow up with PCP in 1-2 weeks Please obtain BMP/CBC in one week your next doctors visit.  Vantin PO BID x 7 days.  Diflucan given at patient's request for vaginal pain. Pain Meds Oxycodone IR 15mg  q6hrs prn 30 tabs Prescribed.  She reports she has plenty of stool softeners at home which she will take.  She has been advised she needs to have at least 1-2 soft bowel movements daily. Muscle relaxer prescribed to be taken at bedtime as needed Patient has not been on Eliquis instead she has been on aspirin and Plavix prior to admission. She 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  Initially she had gone to [facility] ER where she was treated with IV Toradol, IV antibiotic and recommended admission but patient felt better and ended up going home on oral antibiotic and pain medicine.  But due to worsening of the symptoms he comes back to the ER.  CT of the abdomen pelvis shows nonobstructive 2 cm left-sided staghorn calculus, nonobstructive small left-sided nephrolithiasis.  She was seen by urology, no further work-up in the hospital, pain was controlled.  Patient will follow-up with her primary urology at [facility] for further management of her renal stone. Rest of the recommendations as stated above.     Assessment & Plan:   Principal Problem:   Pyelonephritis Active Problems:   Depression    Hypertension   Menorrhagia with regular cycle   Malignant neoplasm of upper-outer quadrant of right female breast ([place])     Left flank pain with presumed pyelonephritis Left-sided staghorn calculi; 2cm NonObt Stone - Continue IV Rocephin.  She is being discharged on 7 days of oral Vantin and Flomax.  Appreciate input from inpatient neurology team.  Pain is better controlled on oxycodone IR and muscle relaxer.  Prescription has been given. - Consulted urology - recommends outptn follow up at [facility] with Dr Amalia Hailey.     Essential hypertension -On Avapro, atenolol   Anxiety disorder -Continue home meds   History of SMA thrombosis - Treated with use of Eliquis.  Now on aspirin Plavix follows outpatient vascular    Body mass index is 32.78 kg/m.      Discharge Diagnoses:  Principal Problem:   Pyelonephritis Active Problems:   Depression   Hypertension   Menorrhagia with regular cycle   Malignant neoplasm of upper-outer quadrant of right female breast [facility])      Consultations: Urology  Subjective: Feels better, no acute complaints at this time besides some pain which she reports is manageable with the help of pain medications. She did have a discussion with Urology team this morning, no plans for any intervention during this admission.   Discharge Exam: Vitals:   04/22/21 2127 04/23/21 0528  BP: (!) 146/97 126/75  Pulse: 80 80  Resp: 16 16  Temp: 97.8 F (36.6 C) 98.1 F (36.7 C)  SpO2: 98% 98%   Vitals:   04/22/21 0910 04/22/21 1229 04/22/21 2127 04/23/21 3016  BP: (!) 181/108 (!) 163/98 (!) 146/97 126/75  Pulse: 80 76 80 80  Resp: 18 18 16 16   Temp: 98.5 F (36.9 C) 98.4 F (36.9 C) 97.8 F (36.6 C) 98.1 F (36.7 C)  TempSrc: Oral Oral Oral Oral  SpO2: 97% 99% 98% 98%  Weight:      Height:        General: Pt is alert, awake, not in acute distress Cardiovascular: RRR, S1/S2 +, no rubs, no gallops Respiratory: CTA bilaterally, no wheezing, no  rhonchi Abdominal: Soft, NT, ND, bowel sounds + Extremities: no edema, no cyanosis  Discharge Instructions   Allergies as of 04/23/2021       Reactions   Tramadol Other (See Comments)   Migraine headaches        Medication List     STOP taking these medications    azithromycin 250 MG tablet Commonly known as: ZITHROMAX   Eliquis 5 MG Tabs tablet Generic drug: apixaban       TAKE these medications    ALPRAZolam 0.5 MG tablet Commonly known as: XANAX TAKE 1 TABLET BY MOUTH TWICE A DAY AS NEEDED FOR ANXIETY What changed: See the new instructions.   aspirin 81 MG EC tablet Take 81 mg by mouth daily.   atenolol 100 MG tablet Commonly known as: TENORMIN Take 1 tablet (100 mg total) by mouth daily.   cefpodoxime 200 MG tablet Commonly known as: VANTIN Take 1 tablet (200 mg total) by mouth 2 (two) times daily for 7 days.   clopidogrel 75 MG tablet Commonly known as: PLAVIX Take 75 mg by mouth daily.   gabapentin 300 MG capsule Commonly known as: NEURONTIN TAKE ONE CAPSULE BY MOUTH 3 TIMES A DAY What changed:  how much to take when to take this   methocarbamol 500 MG tablet Commonly known as: ROBAXIN Take 1 tablet (500 mg total) by mouth at bedtime as needed for muscle spasms.   olmesartan 40 MG tablet Commonly known as: BENICAR Take 40 mg by mouth daily.   oxyCODONE 15 MG immediate release tablet Commonly known as: ROXICODONE Take 1 tablet (15 mg total) by mouth every 6 (six) hours as needed for pain. What changed:  when to take this reasons to take this   rOPINIRole 2 MG tablet Commonly known as: Requip 1-2 tabs po qhs What changed:  how much to take how to take this when to take this additional instructions   solifenacin 10 MG tablet Commonly known as: VESICARE Take 10 mg by mouth daily.   tamoxifen 20 MG tablet Commonly known as: NOLVADEX TAKE 1 TABLET BY MOUTH EVERYDAY AT BEDTIME What changed:  how much to take how to take  this when to take this additional instructions   tamsulosin 0.4 MG Caps capsule Commonly known as: FLOMAX Take 1 capsule (0.4 mg total) by mouth daily after breakfast.        Follow-up Information     Celene Squibb, MD Follow up in 1 week(s).   Specialty: Internal Medicine Contact information: [facility] [address] [phone]         Domingo Pulse, MD. Call in 1 week(s).   Specialty: Urology Why: If symptoms worsen Contact information: [address] [place] [address] [phone]                Allergies  Allergen Reactions   Tramadol Other (See Comments)    Migraine headaches     You were  cared for by a hospitalist during your hospital stay. If you have any questions about your discharge medications or the care you received while you were in the hospital after you are discharged, you can call the unit and asked to speak with the hospitalist on call if the hospitalist that took care of you is not available. Once you are discharged, your primary care physician will handle any further medical issues. Please note that no refills for any discharge medications will be authorized once you are discharged, as it is imperative that you return to your primary care physician (or establish a relationship with a primary care physician if you do not have one) for your aftercare needs so that they can reassess your need for medications and monitor your lab values.   Procedures/Studies: DG Abd 1 View  Result Date: 04/04/2021 CLINICAL DATA:  kidney stone in RLQ per U/S done 3 months ago, now having pain in same area, hx mesenteric artery stent, breast CA EXAM: ABDOMEN - 1 VIEW COMPARISON:  CT abdomen pelvis 06/08/2020 FINDINGS: The bowel gas pattern is normal. No radio-opaque calculi or other significant radiographic abnormality are seen. Superior mesenteric artery stent noted. Calcific density overlying the right iliac bone consistent with known  density within the gluteal soft tissues likely related to an injection granuloma. IMPRESSION: Negative. Electronically Signed   By: Iven Finn M.D.   On: 04/04/2021 22:09   CT Renal Stone Study  Result Date: 04/19/2021 CLINICAL DATA:  Flank pain. Large stone on the left. Schedule surgery as cannot passed stone on 05/11/2021. Patient has increased severe pain EXAM: CT ABDOMEN AND PELVIS WITHOUT CONTRAST TECHNIQUE: Multidetector CT imaging of the abdomen and pelvis was performed following the standard protocol without IV contrast. COMPARISON:  None. FINDINGS: Lower chest: No acute abnormality.  Coronary artery calcifications. Hepatobiliary: The liver is enlarged measuring up to 22 cm. The hepatic parenchyma is diffusely hypodense compared to the splenic parenchyma consistent with fatty infiltration. No focal liver abnormality. Pericholecystic focal fatty sparing. Contracted gallbladder. No gallstones, gallbladder wall thickening, or pericholecystic fluid. No biliary dilatation. Pancreas: No focal lesion. Normal pancreatic contour. No surrounding inflammatory changes. No main pancreatic ductal dilatation. Spleen: Normal in size without focal abnormality. Adrenals/Urinary Tract: No adrenal nodule bilaterally. At least 2 cm left staghorn calculi. Several other smaller left renal stones noted. Query amorphous 4 mm calcified stone within the right kidney. No hydronephrosis. No definite contour-deforming renal mass. Scarred and atrophic left kidney. No ureterolithiasis or hydroureter. The urinary bladder is unremarkable. Stomach/Bowel: Stomach is within normal limits. No evidence of bowel wall thickening or dilatation. Appendix appears normal. Vascular/Lymphatic: SMA origin stent with limited evaluation this noncontrast study no abdominal aorta or iliac aneurysm. Severe atherosclerotic plaque of the aorta and its branches. No abdominal, pelvic, or inguinal lymphadenopathy. Reproductive: There is a 3 x 2.1 cm right  ovarian cystic lesion. No follow-up imaging recommended. Note: This recommendation does not apply to premenarchal patients and to those with increased risk (genetic, family history, elevated tumor markers or other high-risk factors) of ovarian cancer. Reference: JACR 2020 Feb; 17(2):248-254. Otherwise uterus and bilateral adnexa are unremarkable. Other: No intraperitoneal free fluid. No intraperitoneal free gas. No organized fluid collection. Musculoskeletal: No abdominal wall hernia or abnormality. No suspicious lytic or blastic osseous lesions. No acute displaced fracture. Multilevel degenerative changes of the spine. IMPRESSION: 1. Nonobstructive 2 cm left staghorn calculus. Other nonobstructive smaller left nephrolithiasis. Query post 4 mm right nephrolithiasis. No definite findings to suggest superimposed infection;  however, limited evaluation on this noncontrast study. 2. Hepatomegaly and hepatic steatosis. 3.  Aortic Atherosclerosis (ICD10-I70.0). Electronically Signed   By: Iven Finn M.D.   On: 04/19/2021 20:16     The results of significant diagnostics from this hospitalization (including imaging, microbiology, ancillary and laboratory) are listed below for reference.     Microbiology: Recent Results (from the past 240 hour(s))  Urine Culture     Status: Abnormal   Collection Time: 04/19/21  7:46 PM   Specimen: Urine, Clean Catch  Result Value Ref Range Status   Specimen Description   Final    URINE, CLEAN CATCH Performed at [facility], [address] [place], [place] [address]    Special Requests   Final    NONE Performed at [facility], [address] [place], [place] [address]    Culture >=100,000 COLONIES/mL KLEBSIELLA PNEUMONIAE (A)  Final   Report Status 04/22/2021 FINAL  Final   Organism ID, Bacteria KLEBSIELLA PNEUMONIAE (A)  Final      Susceptibility   Klebsiella pneumoniae - MIC*    AMPICILLIN >=32 RESISTANT Resistant      CEFAZOLIN <=4 SENSITIVE Sensitive     CEFEPIME <=0.12 SENSITIVE Sensitive     CEFTRIAXONE <=0.25 SENSITIVE Sensitive     CIPROFLOXACIN <=0.25 SENSITIVE Sensitive     GENTAMICIN <=1 SENSITIVE Sensitive     IMIPENEM <=0.25 SENSITIVE Sensitive     NITROFURANTOIN 64 INTERMEDIATE Intermediate     TRIMETH/SULFA <=20 SENSITIVE Sensitive     AMPICILLIN/SULBACTAM 4 SENSITIVE Sensitive     PIP/TAZO <=4 SENSITIVE Sensitive     * >=100,000 COLONIES/mL KLEBSIELLA PNEUMONIAE  Resp Panel by RT-PCR (Flu A&B, Covid) Nasopharyngeal Swab     Status: None   Collection Time: 04/20/21  5:38 PM   Specimen: Nasopharyngeal Swab; Nasopharyngeal(NP) swabs in vial transport medium  Result Value Ref Range Status   SARS Coronavirus 2 by RT PCR NEGATIVE NEGATIVE Final    Comment: (NOTE) SARS-CoV-2 target nucleic acids are NOT DETECTED.  The SARS-CoV-2 RNA is generally detectable in upper respiratory specimens during the acute phase of infection. The lowest concentration of SARS-CoV-2 viral copies this assay can detect is 138 copies/mL. A negative result does not preclude SARS-Cov-2 infection and should not be used as the sole basis for treatment or other patient management decisions. A negative result may occur with  improper specimen collection/handling, submission of specimen other than nasopharyngeal swab, presence of viral mutation(s) within the areas targeted by this assay, and inadequate number of viral copies(<138 copies/mL). A negative result must be combined with clinical observations, patient history, and epidemiological information. The expected result is Negative.  Fact Sheet for Patients:  EntrepreneurPulse.com.au  Fact Sheet for Healthcare Providers:  IncredibleEmployment.be  This test is no t yet approved or cleared by the [place] FDA and  has been authorized for detection and/or diagnosis of SARS-CoV-2 by FDA under an Emergency Use Authorization  (EUA). This EUA will remain  in effect (meaning this test can be used) for the duration of the COVID-19 declaration under Section 564(b)(1) of the Act, 21 U.S.C.section 360bbb-3(b)(1), unless the authorization is terminated  or revoked sooner.       Influenza A by PCR NEGATIVE NEGATIVE Final   Influenza B by PCR NEGATIVE NEGATIVE Final    Comment: (NOTE) The Xpert Xpress SARS-CoV-2/FLU/RSV plus assay is intended as an aid in the diagnosis of influenza from Nasopharyngeal swab specimens and should not be used as a sole basis for  treatment. Nasal washings and aspirates are unacceptable for Xpert Xpress SARS-CoV-2/FLU/RSV testing.  Fact Sheet for Patients: EntrepreneurPulse.com.au  Fact Sheet for Healthcare Providers: IncredibleEmployment.be  This test is not yet approved or cleared by the [place] FDA and has been authorized for detection and/or diagnosis of SARS-CoV-2 by FDA under an Emergency Use Authorization (EUA). This EUA will remain in effect (meaning this test can be used) for the duration of the COVID-19 declaration under Section 564(b)(1) of the Act, 21 U.S.C. section 360bbb-3(b)(1), unless the authorization is terminated or revoked.  Performed at [facility], [place] [address] [place], [place] [address]      Labs: BNP (last 3 results) Recent Labs    05/10/20 0841  BNP 38.2   Basic Metabolic Panel: Recent Labs  Lab 04/19/21 1918 04/20/21 1401 04/21/21 0433 04/23/21 0439  NA 140 138 138 142  K 4.3 4.1 4.1 4.1  CL 105 106 106 113*  CO2 22 21* 20* 21*  GLUCOSE 153* 106* 85 100*  BUN 22* 24* 26* 17  CREATININE 0.97 0.91 0.92 0.63  CALCIUM 9.9 9.3 8.4* 8.4*  MG  --   --  2.2 2.0  PHOS  --   --  4.7*  --    Liver Function Tests: Recent Labs  Lab 04/20/21 1401 04/21/21 0433  AST 41 31  ALT 41 30  ALKPHOS 53 45  BILITOT 0.3 0.4  PROT 7.7 6.1*  ALBUMIN 3.9 3.3*   No results for input(s):  LIPASE, AMYLASE in the last 168 hours. No results for input(s): AMMONIA in the last 168 hours. CBC: Recent Labs  Lab 04/19/21 1918 04/20/21 1401 04/21/21 0433 04/23/21 0439  WBC 13.0* 10.5 8.9 10.0  NEUTROABS 8.9* 7.0  --   --   HGB 12.9 13.7 11.1* 11.4*  HCT 39.2 40.7 34.4* 34.3*  MCV 84.5 84.8 88.0 85.3  PLT 377 369 236 296   Cardiac Enzymes: No results for input(s): CKTOTAL, CKMB, CKMBINDEX, TROPONINI in the last 168 hours. BNP: Invalid input(s): POCBNP CBG: No results for input(s): GLUCAP in the last 168 hours. D-Dimer No results for input(s): DDIMER in the last 72 hours. Hgb A1c No results for input(s): HGBA1C in the last 72 hours. Lipid Profile No results for input(s): CHOL, HDL, LDLCALC, TRIG, CHOLHDL, LDLDIRECT in the last 72 hours. Thyroid function studies No results for input(s): TSH, T4TOTAL, T3FREE, THYROIDAB in the last 72 hours.  Invalid input(s): FREET3 Anemia work up No results for input(s): VITAMINB12, FOLATE, FERRITIN, TIBC, IRON, RETICCTPCT in the last 72 hours. Urinalysis    Component Value Date/Time   COLORURINE YELLOW 04/20/2021 1401   APPEARANCEUR HAZY (A) 04/20/2021 1401   LABSPEC 1.017 04/20/2021 1401   PHURINE 6.0 04/20/2021 1401   GLUCOSEU NEGATIVE 04/20/2021 1401   HGBUR SMALL (A) 04/20/2021 1401   BILIRUBINUR NEGATIVE 04/20/2021 1401   KETONESUR NEGATIVE 04/20/2021 1401   PROTEINUR NEGATIVE 04/20/2021 1401   NITRITE NEGATIVE 04/20/2021 1401   LEUKOCYTESUR LARGE (A) 04/20/2021 1401   Sepsis Labs Invalid input(s): PROCALCITONIN,  WBC,  LACTICIDVEN Microbiology Recent Results (from the past 240 hour(s))  Urine Culture     Status: Abnormal   Collection Time: 04/19/21  7:46 PM   Specimen: Urine, Clean Catch  Result Value Ref Range Status   Specimen Description   Final    URINE, CLEAN CATCH Performed at [facility], [address], [place], [place] [address]    Special Requests   Final    NONE Performed  at [facility], [place], [place] [address]    Culture >=100,000 COLONIES/mL KLEBSIELLA PNEUMONIAE (A)  Final   Report Status 04/22/2021 FINAL  Final   Organism ID, Bacteria KLEBSIELLA PNEUMONIAE (A)  Final      Susceptibility   Klebsiella pneumoniae - MIC*    AMPICILLIN >=32 RESISTANT Resistant     CEFAZOLIN <=4 SENSITIVE Sensitive     CEFEPIME <=0.12 SENSITIVE Sensitive     CEFTRIAXONE <=0.25 SENSITIVE Sensitive     CIPROFLOXACIN <=0.25 SENSITIVE Sensitive     GENTAMICIN <=1 SENSITIVE Sensitive     IMIPENEM <=0.25 SENSITIVE Sensitive     NITROFURANTOIN 64 INTERMEDIATE Intermediate     TRIMETH/SULFA <=20 SENSITIVE Sensitive     AMPICILLIN/SULBACTAM 4 SENSITIVE Sensitive     PIP/TAZO <=4 SENSITIVE Sensitive     * >=100,000 COLONIES/mL KLEBSIELLA PNEUMONIAE  Resp Panel by RT-PCR (Flu A&B, Covid) Nasopharyngeal Swab     Status: None   Collection Time: 04/20/21  5:38 PM   Specimen: Nasopharyngeal Swab; Nasopharyngeal(NP) swabs in vial transport medium  Result Value Ref Range Status   SARS Coronavirus 2 by RT PCR NEGATIVE NEGATIVE Final    Comment: (NOTE) SARS-CoV-2 target nucleic acids are NOT DETECTED.  The SARS-CoV-2 RNA is generally detectable in upper respiratory specimens during the acute phase of infection. The lowest concentration of SARS-CoV-2 viral copies this assay can detect is 138 copies/mL. A negative result does not preclude SARS-Cov-2 infection and should not be used as the sole basis for treatment or other patient management decisions. A negative result may occur with  improper specimen collection/handling, submission of specimen other than nasopharyngeal swab, presence of viral mutation(s) within the areas targeted by this assay, and inadequate number of viral copies(<138 copies/mL). A negative result must be combined with clinical observations, patient history, and epidemiological information. The expected result is Negative.  Fact  Sheet for Patients:  EntrepreneurPulse.com.au  Fact Sheet for Healthcare Providers:  IncredibleEmployment.be  This test is no t yet approved or cleared by the [place] FDA and  has been authorized for detection and/or diagnosis of SARS-CoV-2 by FDA under an Emergency Use Authorization (EUA). This EUA will remain  in effect (meaning this test can be used) for the duration of the COVID-19 declaration under Section 564(b)(1) of the Act, 21 U.S.C.section 360bbb-3(b)(1), unless the authorization is terminated  or revoked sooner.       Influenza A by PCR NEGATIVE NEGATIVE Final   Influenza B by PCR NEGATIVE NEGATIVE Final    Comment: (NOTE) The Xpert Xpress SARS-CoV-2/FLU/RSV plus assay is intended as an aid in the diagnosis of influenza from Nasopharyngeal swab specimens and should not be used as a sole basis for treatment. Nasal washings and aspirates are unacceptable for Xpert Xpress SARS-CoV-2/FLU/RSV testing.  Fact Sheet for Patients: EntrepreneurPulse.com.au  Fact Sheet for Healthcare Providers: IncredibleEmployment.be  This test is not yet approved or cleared by the [place] FDA and has been authorized for detection and/or diagnosis of SARS-CoV-2 by FDA under an Emergency Use Authorization (EUA). This EUA will remain in effect (meaning this test can be used) for the duration of the COVID-19 declaration under Section 564(b)(1) of the Act, 21 U.S.C. section 360bbb-3(b)(1), unless the authorization is terminated or revoked.  Performed at [facility], [place] [address], [place], [place] [address]      Time coordinating discharge:  I have spent 35 minutes face to face with the patient and on the ward discussing the  patients care, assessment, plan and disposition with other care givers. >50% of the time was devoted counseling the patient about the risks and benefits of  treatment/Discharge disposition and coordinating care.   SIGNED:   Damita Lack, MD  [facility] 04/23/2021, 7:47 AM   If 7PM-7AM, please contact night-coverage

## 2021-04-23 NOTE — Progress Notes (Signed)
Patient was given discharge instructions, and all questions were answered. Patient was stable for discharge and was walked to the main exit. 

## 2021-04-23 NOTE — Progress Notes (Signed)
  Subjective: Pain presently controlled this AM. She did require IV pain meds last night. No nausea or emesis. Tolerating diet. Voiding without difficulty.  Objective: Vital signs in last 24 hours: Temp:  [97.8 F (36.6 C)-98.5 F (36.9 C)] 98.1 F (36.7 C) (11/28 0528) Pulse Rate:  [72-80] 80 (11/28 0528) Resp:  [15-18] 16 (11/28 0528) BP: (126-183)/(75-108) 126/75 (11/28 0528) SpO2:  [97 %-99 %] 98 % (11/28 0528)  Intake/Output from previous day: 11/27 0701 - 11/28 0700 In: 3547.3 [P.O.:1560; I.V.:1887.2; IV Piggyback:100.1] Out: 1900 [Urine:1850; Emesis/NG output:50] Intake/Output this shift: Total I/O In: 1013.2 [P.O.:240; I.V.:701.7; IV Piggyback:71.5] Out: 400 [Urine:400]  Physical Exam:  General: Alert and oriented CV: RRR Lungs: Clear Abdomen: Soft, ND, NT, mild L CVA tenderness Ext: NT, No erythema  Lab Results: Recent Labs    04/20/21 1401 04/21/21 0433 04/23/21 0439  HGB 13.7 11.1* 11.4*  HCT 40.7 34.4* 34.3*   BMET Recent Labs    04/21/21 0433 04/23/21 0439  NA 138 142  K 4.1 4.1  CL 106 113*  CO2 20* 21*  GLUCOSE 85 100*  BUN 26* 17  CREATININE 0.92 0.63  CALCIUM 8.4* 8.4*     Studies/Results: No results found.  Assessment/Plan: Symptomatic non-obstructing 2cm left renal stone: Confirmed on CT A/P 04/19/2021.  No signs of hydroureteronephrosis.  She follows with Dr. Amalia Hailey at [facility] and has left ureteroscopy/laser lithotripsy scheduled for 05/11/2021. Recurrent urinary tract infection: Urine culture 04/19/2021 with greater than 100,000 Klebsiella.  -Pt pain is currently controlled. We did discuss that if pain is persistent, we could try ureteral stent or nephrostomy tube, however, there is no hydronephrosis and the stent may not alleviate her pain. She does not wish to attempt stent or PCN at this time. -Continue pain control, supportive care -Continue rocephin while in hospital. Upon discharge, continue Vantin per previous  recommendation with [facility] for her Klebsiella UTI. -She has L URS/LL scheduled with Dr. [facility]alia Hailey at [facility] on 05/11/2021 [facility] for discharge from urology perspective with followup with Dr. Amalia Hailey   LOS: 3 days   Matt R. Trease Bremner MD 04/23/2021, 5:56 AM [facility]  Pager: [phone]

## 2021-04-26 DIAGNOSIS — S46011A Strain of muscle(s) and tendon(s) of the rotator cuff of right shoulder, initial encounter: Secondary | ICD-10-CM | POA: Diagnosis not present

## 2021-04-26 DIAGNOSIS — M75121 Complete rotator cuff tear or rupture of right shoulder, not specified as traumatic: Secondary | ICD-10-CM | POA: Diagnosis not present

## 2021-04-26 DIAGNOSIS — M19011 Primary osteoarthritis, right shoulder: Secondary | ICD-10-CM | POA: Diagnosis not present

## 2021-04-26 DIAGNOSIS — M7501 Adhesive capsulitis of right shoulder: Secondary | ICD-10-CM | POA: Diagnosis not present

## 2021-04-26 DIAGNOSIS — G8918 Other acute postprocedural pain: Secondary | ICD-10-CM | POA: Diagnosis not present

## 2021-04-26 DIAGNOSIS — M7541 Impingement syndrome of right shoulder: Secondary | ICD-10-CM | POA: Diagnosis not present

## 2021-04-26 DIAGNOSIS — M24111 Other articular cartilage disorders, right shoulder: Secondary | ICD-10-CM | POA: Diagnosis not present

## 2021-05-11 DIAGNOSIS — Z7902 Long term (current) use of antithrombotics/antiplatelets: Secondary | ICD-10-CM | POA: Diagnosis not present

## 2021-05-11 DIAGNOSIS — Z87891 Personal history of nicotine dependence: Secondary | ICD-10-CM | POA: Diagnosis not present

## 2021-05-11 DIAGNOSIS — E669 Obesity, unspecified: Secondary | ICD-10-CM | POA: Diagnosis not present

## 2021-05-11 DIAGNOSIS — Z79899 Other long term (current) drug therapy: Secondary | ICD-10-CM | POA: Diagnosis not present

## 2021-05-11 DIAGNOSIS — Z87440 Personal history of urinary (tract) infections: Secondary | ICD-10-CM | POA: Diagnosis not present

## 2021-05-11 DIAGNOSIS — N200 Calculus of kidney: Secondary | ICD-10-CM | POA: Diagnosis not present

## 2021-05-11 DIAGNOSIS — Z7982 Long term (current) use of aspirin: Secondary | ICD-10-CM | POA: Diagnosis not present

## 2021-05-11 DIAGNOSIS — N390 Urinary tract infection, site not specified: Secondary | ICD-10-CM | POA: Diagnosis not present

## 2021-05-11 DIAGNOSIS — Z6835 Body mass index (BMI) 35.0-35.9, adult: Secondary | ICD-10-CM | POA: Diagnosis not present

## 2021-05-11 DIAGNOSIS — I10 Essential (primary) hypertension: Secondary | ICD-10-CM | POA: Diagnosis not present

## 2021-06-04 ENCOUNTER — Encounter: Payer: Self-pay | Admitting: Hematology

## 2021-06-04 ENCOUNTER — Inpatient Hospital Stay: Payer: Managed Care, Other (non HMO)

## 2021-06-04 ENCOUNTER — Inpatient Hospital Stay: Payer: Managed Care, Other (non HMO) | Attending: Hematology | Admitting: Hematology

## 2021-06-04 ENCOUNTER — Other Ambulatory Visit: Payer: Self-pay

## 2021-06-04 VITALS — BP 141/77 | HR 80 | Temp 98.2°F | Resp 16 | Ht 69.0 in | Wt 222.9 lb

## 2021-06-04 DIAGNOSIS — G2581 Restless legs syndrome: Secondary | ICD-10-CM | POA: Insufficient documentation

## 2021-06-04 DIAGNOSIS — Z170 Estrogen receptor positive status [ER+]: Secondary | ICD-10-CM

## 2021-06-04 DIAGNOSIS — C50411 Malignant neoplasm of upper-outer quadrant of right female breast: Secondary | ICD-10-CM

## 2021-06-04 DIAGNOSIS — M1990 Unspecified osteoarthritis, unspecified site: Secondary | ICD-10-CM | POA: Diagnosis not present

## 2021-06-04 DIAGNOSIS — I10 Essential (primary) hypertension: Secondary | ICD-10-CM | POA: Diagnosis not present

## 2021-06-04 DIAGNOSIS — Z7982 Long term (current) use of aspirin: Secondary | ICD-10-CM | POA: Insufficient documentation

## 2021-06-04 DIAGNOSIS — Z79899 Other long term (current) drug therapy: Secondary | ICD-10-CM | POA: Diagnosis not present

## 2021-06-04 LAB — CBC WITH DIFFERENTIAL/PLATELET
Abs Immature Granulocytes: 0.04 10*3/uL (ref 0.00–0.07)
Basophils Absolute: 0.1 10*3/uL (ref 0.0–0.1)
Basophils Relative: 1 %
Eosinophils Absolute: 0.4 10*3/uL (ref 0.0–0.5)
Eosinophils Relative: 4 %
HCT: 37.1 % (ref 36.0–46.0)
Hemoglobin: 12.3 g/dL (ref 12.0–15.0)
Immature Granulocytes: 0 %
Lymphocytes Relative: 26 %
Lymphs Abs: 2.6 10*3/uL (ref 0.7–4.0)
MCH: 28 pg (ref 26.0–34.0)
MCHC: 33.2 g/dL (ref 30.0–36.0)
MCV: 84.3 fL (ref 80.0–100.0)
Monocytes Absolute: 0.6 10*3/uL (ref 0.1–1.0)
Monocytes Relative: 6 %
Neutro Abs: 6.5 10*3/uL (ref 1.7–7.7)
Neutrophils Relative %: 63 %
Platelets: 306 10*3/uL (ref 150–400)
RBC: 4.4 MIL/uL (ref 3.87–5.11)
RDW: 12.8 % (ref 11.5–15.5)
WBC: 10.1 10*3/uL (ref 4.0–10.5)
nRBC: 0 % (ref 0.0–0.2)

## 2021-06-04 LAB — COMPREHENSIVE METABOLIC PANEL
ALT: 46 U/L — ABNORMAL HIGH (ref 0–44)
AST: 32 U/L (ref 15–41)
Albumin: 4.1 g/dL (ref 3.5–5.0)
Alkaline Phosphatase: 71 U/L (ref 38–126)
Anion gap: 10 (ref 5–15)
BUN: 28 mg/dL — ABNORMAL HIGH (ref 6–20)
CO2: 22 mmol/L (ref 22–32)
Calcium: 9.4 mg/dL (ref 8.9–10.3)
Chloride: 104 mmol/L (ref 98–111)
Creatinine, Ser: 0.87 mg/dL (ref 0.44–1.00)
GFR, Estimated: >60 mL/min (ref >60)
Glucose, Bld: 101 mg/dL — ABNORMAL HIGH (ref 70–99)
Potassium: 4.3 mmol/L (ref 3.5–5.1)
Sodium: 136 mmol/L (ref 135–145)
Total Bilirubin: 0.3 mg/dL (ref 0.3–1.2)
Total Protein: 7.5 g/dL (ref 6.5–8.1)

## 2021-06-04 NOTE — Progress Notes (Signed)
[place]   [phone] [phone]   Clinic Follow up Note   Patient Care Team: Celene Squibb, MD as PCP - General (Internal Medicine) Domingo Pulse, MD as Consulting Physician (Urology) Truitt Merle, MD as Consulting Physician (Hematology)  Date of Service:  06/04/2021  CHIEF COMPLAINT: f/u of right breast cancer  CURRENT THERAPY:  Tamoxifen, started 02/2017  ASSESSMENT & PLAN:  Susan Benton is a 51 y.o. female with   1. Stage Ia right breast IDC, ER/PR+, HER2- -presented with right breast pain. B/l MM and [place] on 10/01/16 showed a 1 cm irregular right breast mass at 10 o'clock with calcifications, bilateral fibroadenomas, and a benign right breast cyst at 9 o'clock. Biopsy 10/08/16 confirmed IDC, grade 1-2, with DCIS -she proceeded to bilateral mastectomies on 10/23/16 under Dr. Arnoldo Morale. Right breast path showed multifocal IDC, grade 1, 4.6 cm and 0.6 cm, and low grade DCIS. Left breast was benign. -oncotype recurrence score of 9 (low risk).  -most recent CT CAP 06/08/20 was negative. -she was pre-menopausal then and began tamoxifen around 02/2017. She has tolerated relatively well, but she has recently experienced vaginal spotting after no menstrual period for 3 years. I recommend checking her [place] level and referral to GYN. I will also obtain baseline labs as well. -If she is postmenopausal, I recommend switching to AI for additional 5 years.  -I also offered genetic referral due to her family history of breast cancer. She is interested. -I discussed breast cancer surveillance, things to watch at home.  She is close to 5 years out from initial diagnosis, I will see her back in 6 months, then annually.  2. Symptom Management: weight loss, vaginal spotting -she reports vaginal spotting recently. She notes her last period was 3+ years ago. I will refer her to GYN. -she reports unintentional weight loss. Chart review shows loss of ~25 lbs from May to  November 2022. Weight stable since 03/2021.  3. 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 She had breast reconstruction under Dr. Marla Roe but ultimately ended up having the tissue expanders removed. -takes gabapentin as needed.  4. Arthritis -at baseline, in her fingers, shoulders, knees.   Plan: -proceed to lab today, I will call her with results  -If she is postmenopausal, I recommended switching tamoxifen to exemestane, he is agreeable. -referral to GYN and genetics -lab and f/u in 6 months   No problem-specific Assessment & Plan notes found for this encounter.   SUMMARY OF ONCOLOGIC HISTORY: Oncology History  Malignant neoplasm of upper-outer quadrant of right female breast [facility])   Initial Diagnosis   Malignant neoplasm of upper-outer quadrant of right female breast ([place])   10/08/2016 Initial Biopsy   (R) breast needle biopsy (10:00): Invasive ductal carcinoma, grade 1-2, with DCIS.  ER+ (100%), PR+ (100%), Ki67 10%, HER2 neg (ratio 1.03).    10/23/2016 Surgery   Bilateral mastectomies Arnoldo Morale)   10/23/2016 Pathology Results   RIGHT mastectomy: Multifocal IDC, grade 1, spanning 4.6 cm & 0.6 cm. Low grade DCIS. Negative margins. HER2 repeated and neg (ratio 1.36).  LEFT mastectomy: Fibroadenoma, no malignancy.    11/20/2016 Surgery   (R) axillary sentinel lymph node biopsy Arnoldo Morale)   11/20/2016 Pathology Results   0/1 axillary sentinel LN biopsy for carcinoma.    11/28/2016 Oncotype testing   Recurrence score: 9 (low risk); associated with 6% risk of distant recurrence.    12/03/2016 Imaging   CT chest: IMPRESSION: 1. 7.7 cm lobulated fluid attenuation  collection in the right axilla, could represent a postoperative fluid collection, with or without presence of infection. Further management will need to be based on clinical grounds. Could correlate with targeted sonography and aspiration as indicated. 2. Clear lung fields 3. Hepatic steatosis 4. Aortic  Atherosclerosis   12/13/2016 Miscellaneous   BCI genomic testing revealed LOW risk of recurrence with 4.3% risk and LOW likelihood of benefit of extension of therapy beyond 5 years.        INTERVAL HISTORY:  Susan Benton is here for a follow up of breast cancer. She was last seen by Dr. Delton Coombes on 06/13/20. She has opted to transfer her care here now that she lives in [place]. She presents to the clinic alone. She reports concern with vaginal spotting, as her last period was at least 3 years ago. She denies any pain at this time.   All other systems were reviewed with the patient and are negative.  MEDICAL HISTORY:  Past Medical History:  Diagnosis Date   Anxiety    Arthritis    Depression 2013   Fibroids, submucosal 10/04/2016   Headache    Hep C w/o coma, chronic ([place]) 06/27/2014   had treatment    Hx of right and left heart catheterization 2014   had cath in [place], no intervention   Hypertension 1987   Invasive ductal carcinoma of breast ([place]) 10/14/2016   Low iron    Restless legs syndrome     SURGICAL HISTORY: Past Surgical History:  Procedure Laterality Date   AXILLARY SENTINEL NODE BIOPSY Right 11/20/2016   Procedure: AXILLARY SENTINEL NODE BIOPSY;  Surgeon: Aviva Signs, MD;  Location: [facility];  Service: General;  Laterality: Right;  Sentinel Node Injection @ 10:30am   BREAST RECONSTRUCTION WITH PLACEMENT OF TISSUE EXPANDER AND FLEX HD (ACELLULAR HYDRATED DERMIS) Bilateral 12/25/2016   Procedure: BREAST RECONSTRUCTION WITH PLACEMENT OF TISSUE EXPANDER AND FLEX HD (ACELLULAR HYDRATED DERMIS);  Surgeon: Wallace Going, DO;  Location: [facility];  Service: Plastics;  Laterality: Bilateral;   CARDIAC CATHETERIZATION     DILITATION & CURRETTAGE/HYSTROSCOPY WITH NOVASURE ABLATION N/A 10/23/2016   Procedure: DILATATION & CURETTAGE/HYSTEROSCOPY WITH NOVASURE ENDOMETRIAL ABLATION;  Surgeon: Florian Buff, MD;  Location: [facility];  Service: Gynecology;   Laterality: N/A;   MASTECTOMY MODIFIED RADICAL Right 10/23/2016   Procedure: MASTECTOMY MODIFIED RADICAL;  Surgeon: Aviva Signs, MD;  Location: [facility];  Service: General;  Laterality: Right;   SIMPLE MASTECTOMY WITH AXILLARY SENTINEL NODE BIOPSY Left 10/23/2016   Procedure: SIMPLE MASTECTOMY;  Surgeon: Aviva Signs, MD;  Location: [facility];  Service: General;  Laterality: Left;   TISSUE EXPANDER PLACEMENT Bilateral 02/27/2017   Procedure: REMOVAL TISSUE EXPANDER;  Surgeon: Wallace Going, DO;  Location: [place];  Service: Plastics;  Laterality: Bilateral;   TONSILECTOMY, ADENOIDECTOMY, BILATERAL MYRINGOTOMY AND TUBES Bilateral [place]   2000    I have reviewed the social history and family history with the patient and they are unchanged from previous note.  ALLERGIES:  is allergic to tramadol.  MEDICATIONS:  Current Outpatient Medications  Medication Sig Dispense Refill   ALPRAZolam (XANAX) 0.5 MG tablet TAKE 1 TABLET BY MOUTH TWICE A DAY AS NEEDED FOR ANXIETY (Patient taking differently: Take 0.5 mg by mouth 2 (two) times daily.) 30 tablet 0   aspirin 81 MG EC tablet Take 81 mg by mouth daily.     atenolol (TENORMIN) 100 MG tablet Take 1 tablet (100 mg total)  by mouth daily. 30 tablet 1   clopidogrel (PLAVIX) 75 MG tablet Take 75 mg by mouth daily.     gabapentin (NEURONTIN) 300 MG capsule TAKE ONE CAPSULE BY MOUTH 3 TIMES A DAY (Patient taking differently: Take 600 mg by mouth at bedtime.) 90 capsule 2   methocarbamol (ROBAXIN) 500 MG tablet Take 1 tablet (500 mg total) by mouth at bedtime as needed for muscle spasms. 10 tablet 0   olmesartan (BENICAR) 40 MG tablet Take 40 mg by mouth daily.     oxyCODONE (ROXICODONE) 15 MG immediate release tablet Take 1 tablet (15 mg total) by mouth every 6 (six) hours as needed for pain. 30 tablet 0   rOPINIRole (REQUIP) 2 MG tablet 1-2 tabs po qhs (Patient taking differently: Take 4 mg by mouth at bedtime.) 60 tablet 3    solifenacin (VESICARE) 10 MG tablet Take 10 mg by mouth daily.     tamoxifen (NOLVADEX) 20 MG tablet TAKE 1 TABLET BY MOUTH EVERYDAY AT BEDTIME (Patient taking differently: Take 20 mg by mouth at bedtime.) 90 tablet 0   No current facility-administered medications for this visit.    PHYSICAL EXAMINATION: ECOG PERFORMANCE STATUS: 0 - Asymptomatic  Vitals:   06/04/21 1502  BP: (!) 141/77  Pulse: 80  Resp: 16  Temp: 98.2 F (36.8 C)  SpO2: 97%   Wt Readings from Last 3 Encounters:  06/04/21 222 lb 14.4 oz (101.1 kg)  04/21/21 222 lb (100.7 kg)  09/29/20 249 lb 1.9 oz (113 kg)     GENERAL:alert, no distress and comfortable SKIN: skin color, texture, turgor are normal, no rashes or significant lesions EYES: normal, Conjunctiva are pink and non-injected, sclera clear  NECK: supple, thyroid normal size, non-tender, without nodularity LYMPH:  no palpable lymphadenopathy in the cervical, axillary  LUNGS: clear to auscultation and percussion with normal breathing effort HEART: regular rate & rhythm and no murmurs and no lower extremity edema ABDOMEN:abdomen soft, non-tender and normal bowel sounds Musculoskeletal:no cyanosis of digits and no clubbing  NEURO: alert & oriented x 3 with fluent speech, no focal motor/sensory deficits BREAST: No palpable mass, nodules or adenopathy bilaterally. Breast exam benign.   LABORATORY DATA:  I have reviewed the data as listed CBC Latest Ref Rng & Units 06/04/2021 04/23/2021 04/21/2021  WBC 4.0 - 10.5 K/uL 10.1 10.0 8.9  Hemoglobin 12.0 - 15.0 g/dL 12.3 11.4(L) 11.1(L)  Hematocrit 36.0 - 46.0 % 37.1 34.3(L) 34.4(L)  Platelets 150 - 400 K/uL 306 296 236     CMP Latest Ref Rng & Units 06/04/2021 04/23/2021 04/21/2021  Glucose 70 - 99 mg/dL 101(H) 100(H) 85  BUN 6 - 20 mg/dL 28(H) 17 26(H)  Creatinine 0.44 - 1.00 mg/dL 0.87 0.63 0.92  Sodium 135 - 145 mmol/L 136 142 138  Potassium 3.5 - 5.1 mmol/L 4.3 4.1 4.1  Chloride 98 - 111 mmol/L 104 113(H)  106  CO2 22 - 32 mmol/L 22 21(L) 20(L)  Calcium 8.9 - 10.3 mg/dL 9.4 8.4(L) 8.4(L)  Total Protein 6.5 - 8.1 g/dL 7.5 - 6.1(L)  Total Bilirubin 0.3 - 1.2 mg/dL 0.3 - 0.4  Alkaline Phos 38 - 126 U/L 71 - 45  AST 15 - 41 U/L 32 - 31  ALT 0 - 44 U/L 46(H) - 30      RADIOGRAPHIC STUDIES: I have personally reviewed the radiological images as listed and agreed with the findings in the report. No results found.    Orders Placed This Encounter  Procedures  Cancer antigen 27.29    Standing Status:   Standing    Number of Occurrences:   20    Standing Expiration Date:   06/04/2022   CBC with Differential/Platelet    Standing Status:   Standing    Number of Occurrences:   50    Standing Expiration Date:   06/04/2022   Comprehensive metabolic panel    Standing Status:   Standing    Number of Occurrences:   50    Standing Expiration Date:   06/04/2022   Estradiol    Standing Status:   Future    Number of Occurrences:   1    Standing Expiration Date:   05/29/6436   FSH-Follicle stimulating hormone    Standing Status:   Future    Number of Occurrences:   1    Standing Expiration Date:   06/04/2022   Ambulatory referral to Genetics    Referral Priority:   Routine    Referral Type:   Consultation    Referral Reason:   Specialty Services Required    Number of Visits Requested:   1   Ambulatory referral to Gynecology    Referral Priority:   Routine    Referral Type:   Consultation    Referral Reason:   Specialty Services Required    Requested Specialty:   Gynecology    Number of Visits Requested:   1   All questions were answered. The patient knows to call the clinic with any problems, questions or concerns. No barriers to learning was detected. The total time spent in the appointment was 40 minutes.     Truitt Merle, MD 06/04/2021   I, Wilburn Mylar, am acting as scribe for Truitt Merle, MD.   I have reviewed the above documentation for accuracy and completeness, and I agree with the  above.

## 2021-06-05 ENCOUNTER — Telehealth: Payer: Self-pay | Admitting: Hematology

## 2021-06-05 LAB — ESTRADIOL: Estradiol: <5 pg/mL

## 2021-06-05 LAB — CANCER ANTIGEN 27.29: CA 27.29: 19.7 U/mL (ref 0.0–38.6)

## 2021-06-05 LAB — FOLLICLE STIMULATING HORMONE: FSH: 21 m[IU]/mL

## 2021-06-05 NOTE — Telephone Encounter (Signed)
Sch per 1/9 inbsket, left msg

## 2021-06-06 ENCOUNTER — Telehealth: Payer: Self-pay

## 2021-06-06 NOTE — Telephone Encounter (Signed)
[facility] referring for Malignant neoplasm of upper-outer quadrant of right breast in female, estrogen receptor positive ([place]. Tamoxifen, needs routine GYN care. MD only Called and left voicemail for patient to call back to be scheduled.

## 2021-06-07 NOTE — Telephone Encounter (Signed)
Patient is scheduled for 06/28/21 with [facility]

## 2021-06-21 IMAGING — CT CT ANGIO CHEST
2 of 6 series · 18 of 36 positions shown · IV contrast (omnipaque)
Comparison: 06/08/2020, CT of the chest, abdomen and pelvis.

CLINICAL DATA: Shortness of breath for a while. Difficulty
sleeping. Patient tested positive for [id number] on [DATE] 2. History
of breast cancer and blood clots.

EXAM:
CT ANGIOGRAPHY CHEST WITH CONTRAST
TECHNIQUE: Multidetector CT imaging of the chest was performed using the
standard protocol during bolus administration of intravenous
contrast. Multiplanar CT image reconstructions and MIPs were
obtained to evaluate the vascular anatomy.
CONTRAST:  50mL OMNIPAQUE IOHEXOL 350 MG/ML SOLN

[Series 7: pe thins · axial · 0.67mm/px · z∈[+1089,+1316]mm · 17 of 362 slices shown]
[im 19/362  lung]
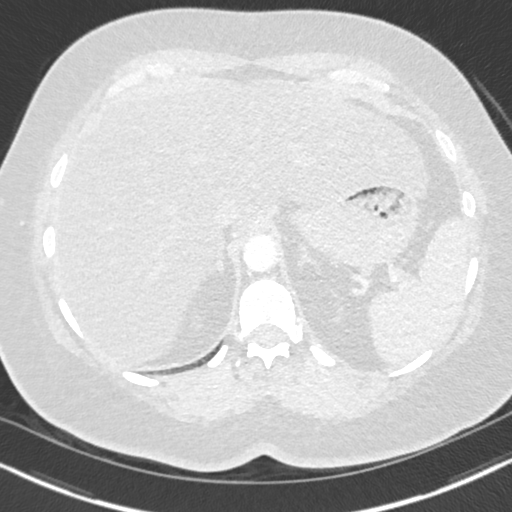
[im 37/362  mediastinal]
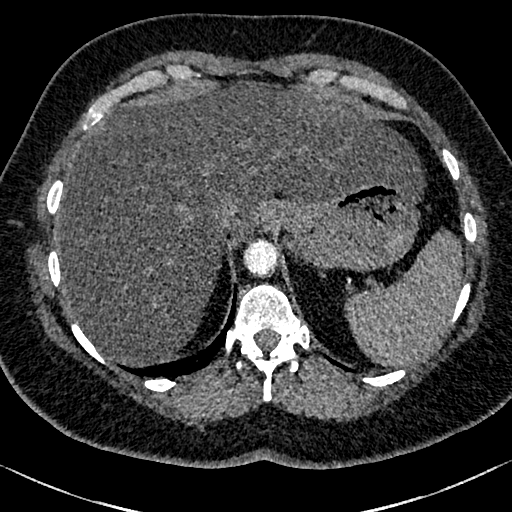
[im 55/362  lung]
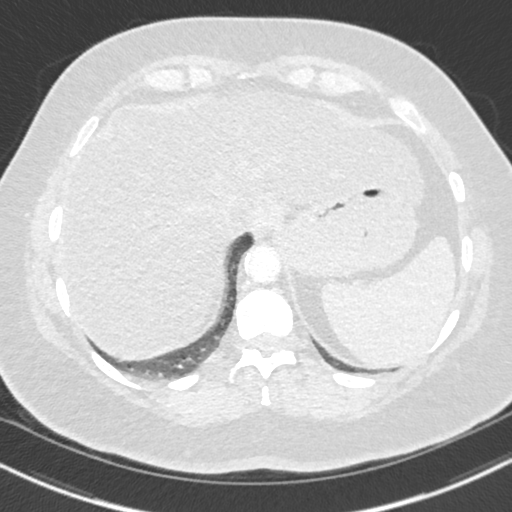
[im 73/362  mediastinal]
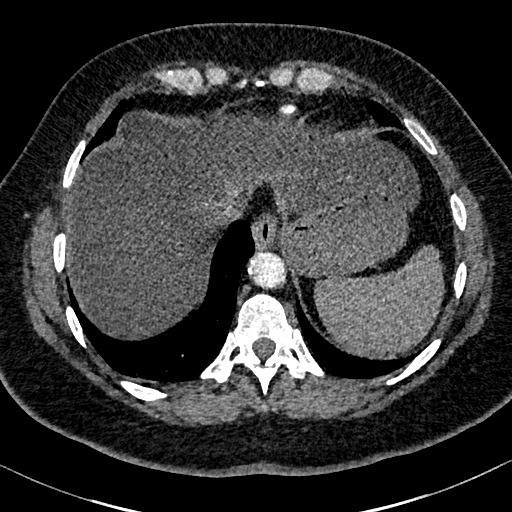
[im 109/362  lung]
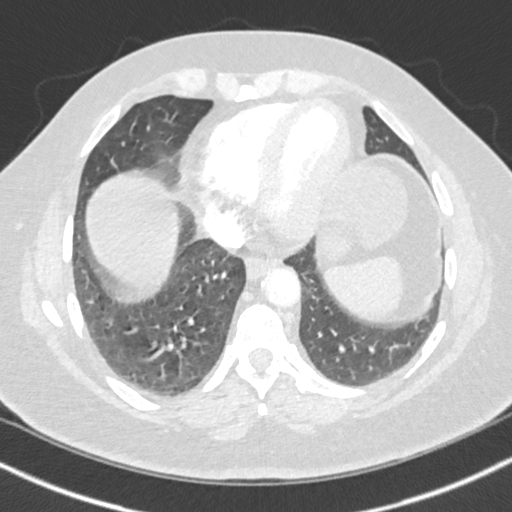
[im 127/362  mediastinal]
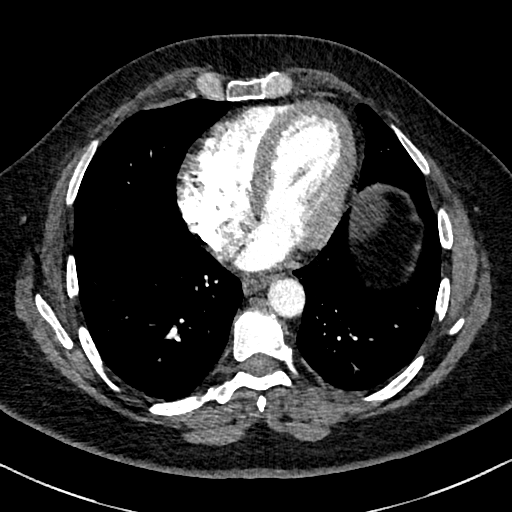
[im 145/362  lung]
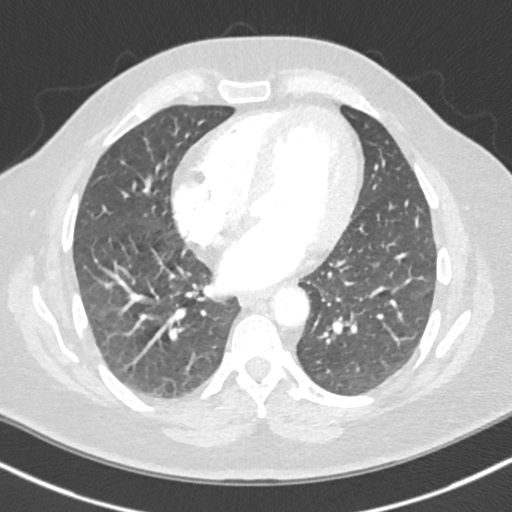
[im 163/362  mediastinal]
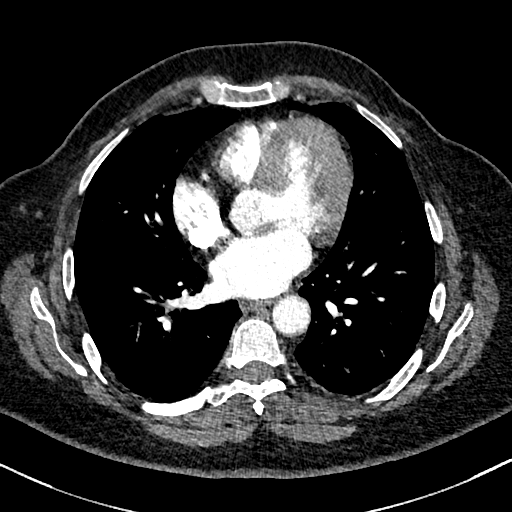
[im 181/362  lung]
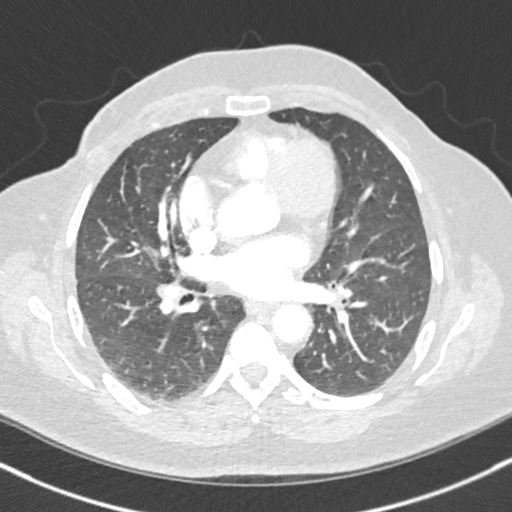
[im 199/362  mediastinal]
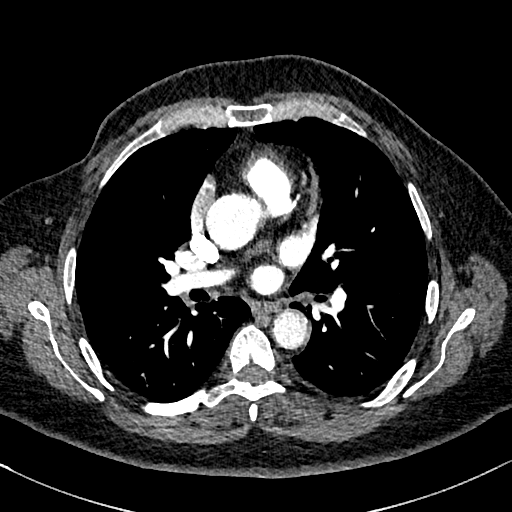
[im 217/362  lung]
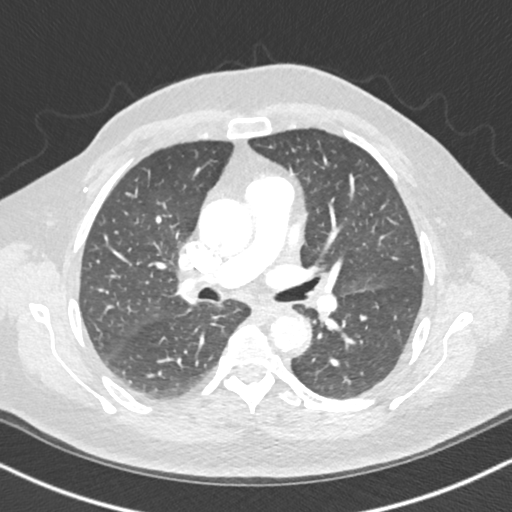
[im 235/362  mediastinal]
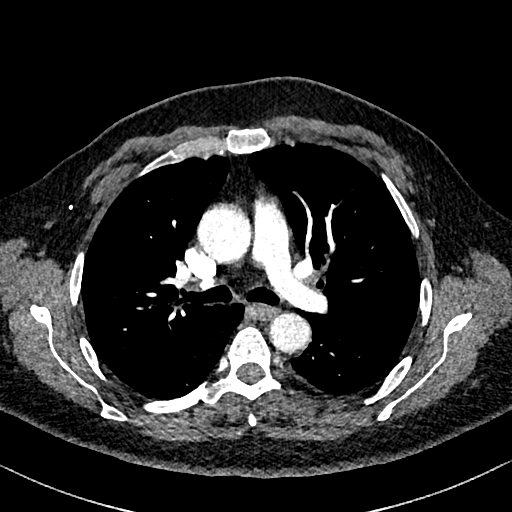
[im 253/362  lung]
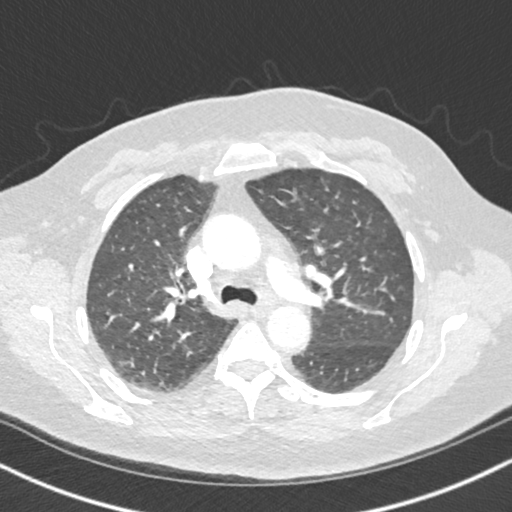
[im 289/362  mediastinal]
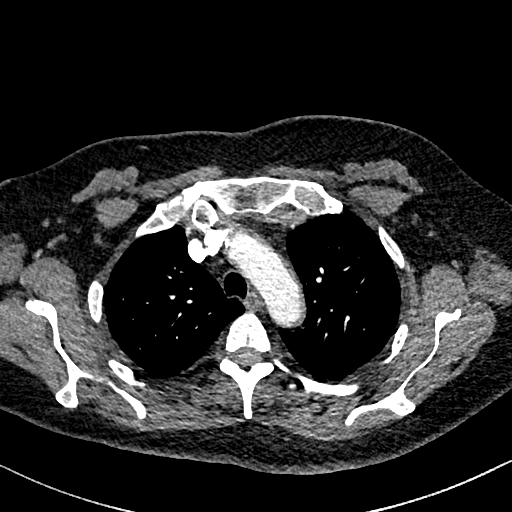
[im 307/362  lung]
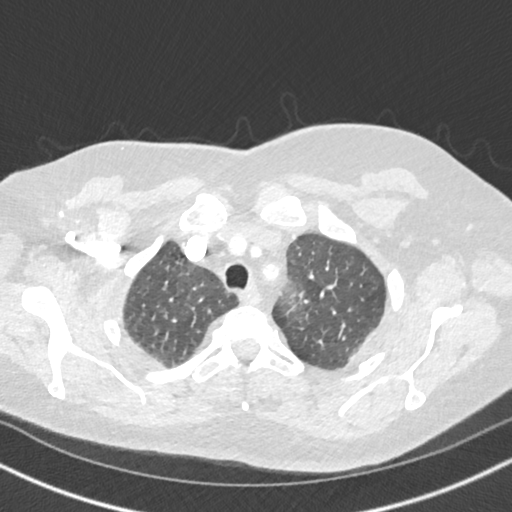
[im 325/362  mediastinal]
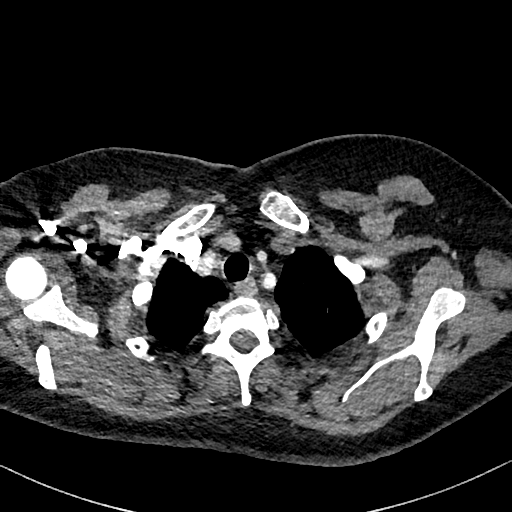
[im 343/362  lung]
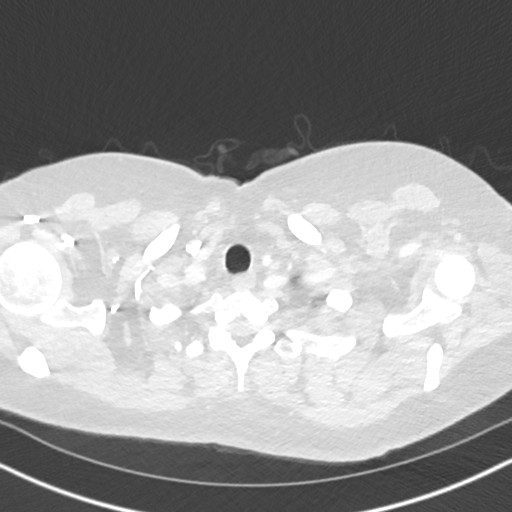

[Series 8: pe 2mm cor · coronal · 0.49mm/px · 1 of 151 slices shown]
[im 76/151  mediastinal]
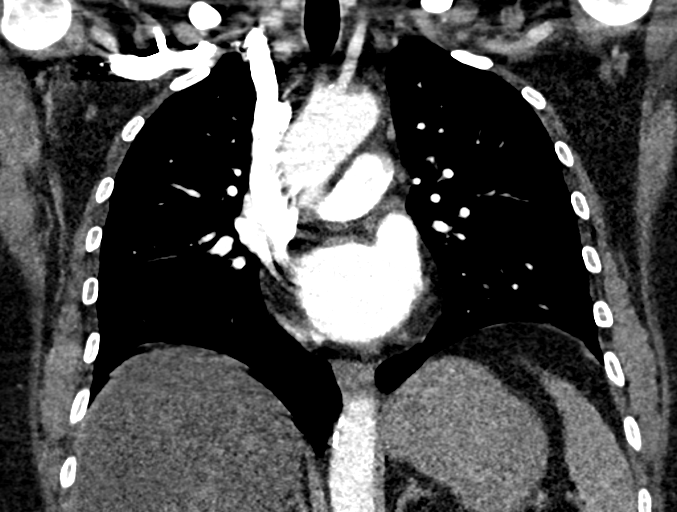

[18 of 36 positions shown; findings below may reference images not displayed]

FINDINGS: Cardiovascular: Pulmonary arteries are satisfactorily opacified.
There is no evidence of a pulmonary embolism.

Heart is normal in size and configuration. No pericardial effusion.
Three-vessel coronary artery calcifications. Great vessels are
normal in caliber. No aortic dissection. Mild aortic
atherosclerosis.

Mediastinum/Nodes: No enlarged mediastinal, hilar, or axillary lymph
nodes. Thyroid gland, trachea, and esophagus demonstrate no
significant findings.

Lungs/Pleura: Clear lungs. Mild areas of bronchial wall thickening
bilaterally. No pleural effusion or pneumothorax.

Upper Abdomen: Liver mildly enlarged and diffusely decreased in
attenuation consistent with fatty infiltration. No acute findings in
the visualized upper abdomen.

Musculoskeletal: No chest wall abnormality. No acute or significant
osseous findings.

Review of the MIP images confirms the above findings.
IMPRESSION: 1. No evidence of a pulmonary embolism.
2. There are areas of bronchial wall thickening in both lungs
consistent with bronchial inflammation, which may be acute or
chronic or a combination.
3. No other evidence of an acute abnormality.  Lungs are clear.
4. Aortic atherosclerosis and diffuse hepatic steatosis and mild
hepatomegaly.

Aortic Atherosclerosis ([id number]).

## 2021-06-27 DIAGNOSIS — C539 Malignant neoplasm of cervix uteri, unspecified: Secondary | ICD-10-CM

## 2021-06-27 HISTORY — DX: Malignant neoplasm of cervix uteri, unspecified: C53.9

## 2021-06-28 ENCOUNTER — Other Ambulatory Visit (HOSPITAL_COMMUNITY)
Admission: RE | Admit: 2021-06-28 | Discharge: 2021-06-28 | Disposition: A | Discharge status: Home / Self Care | Payer: Managed Care, Other (non HMO) | Source: Ambulatory Visit | Attending: Obstetrics & Gynecology | Admitting: Obstetrics & Gynecology

## 2021-06-28 ENCOUNTER — Encounter: Payer: Self-pay | Admitting: Obstetrics & Gynecology

## 2021-06-28 ENCOUNTER — Ambulatory Visit (INDEPENDENT_AMBULATORY_CARE_PROVIDER_SITE_OTHER): Payer: Managed Care, Other (non HMO) | Admitting: Obstetrics & Gynecology

## 2021-06-28 ENCOUNTER — Telehealth: Payer: Self-pay

## 2021-06-28 ENCOUNTER — Other Ambulatory Visit: Payer: Self-pay

## 2021-06-28 VITALS — BP 150/98 | Ht 68.5 in | Wt 223.0 lb

## 2021-06-28 DIAGNOSIS — Z1211 Encounter for screening for malignant neoplasm of colon: Secondary | ICD-10-CM | POA: Diagnosis not present

## 2021-06-28 DIAGNOSIS — Z124 Encounter for screening for malignant neoplasm of cervix: Secondary | ICD-10-CM

## 2021-06-28 DIAGNOSIS — Z9229 Personal history of other drug therapy: Secondary | ICD-10-CM

## 2021-06-28 DIAGNOSIS — N939 Abnormal uterine and vaginal bleeding, unspecified: Secondary | ICD-10-CM | POA: Diagnosis present

## 2021-06-28 NOTE — Patient Instructions (Signed)

## 2021-06-28 NOTE — Progress Notes (Signed)
Postmenopausal Bleeding Patient is a 51 yo G2P2 who complains of vaginal bleeding. She was diagnosed an treated for breast cancer in 2018 [facility]) followed by Tamoxifen daily therapy for the last 5 years.  She also had endometrial ablation in 2018, which did not stop periods.  However, 3 years ago they did indeed stop.  Then, in Nov 2022 she had a brown to red period, followed by one in Dec, and none in Jan.  No pain or associated sx's.  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  Menstrual History: OB History     Gravida  2   Para  2   Term      Preterm      AB      Living         SAB      IAB      Ectopic      Multiple      Live Births              PMHx: She  has a past medical history of Anxiety, Arthritis, Depression (2013), Fibroids, submucosal (10/04/2016), Headache, Hep C w/o coma, chronic ([place]) (06/27/2014), right and left heart catheterization (2014), Hypertension (1987), Invasive ductal carcinoma of breast ([place]) (10/14/2016), Low iron, and Restless legs syndrome. Also,  has a past surgical history that includes Tonsilectomy, adenoidectomy, bilateral myringotomy and tubes (Bilateral, 1984); Tubal ligation (1998); Cardiac catheterization; Mastectomy modified radical (Right, 10/23/2016); Simple mastectomy with axillary sentinel node biopsy (Left, 10/23/2016); Dilatation & currettage/hysteroscopy with novasure ablation (N/A, 10/23/2016); Axillary sentinel node biopsy (Right, 11/20/2016); Breast reconstruction with placement of tissue expander and flex hd (acellular hydrated dermis) (Bilateral, 12/25/2016); Tissue expander placement (Bilateral, 02/27/2017); Bladder repair; Kidney cyst removal; and Rotator cuff repair., family history includes Asthma in her father; Breast cancer in her maternal grandmother and another family member; COPD in her father; Cancer in her maternal grandmother and mother; Diabetes in her father; HIV/AIDS in her brother; Heart disease in her  father; Hyperlipidemia in her father; Hypertension in her father, mother, and son; Kidney disease in her father.,  reports that she quit smoking about 7 months ago. Her smoking use included cigarettes. She has a 5.00 pack-year smoking history. She has never used smokeless tobacco. She reports that she does not drink alcohol and does not use drugs.  She has a current medication list which includes the following prescription(s): alprazolam, aspirin, atenolol, clopidogrel, gabapentin, methocarbamol, olmesartan, oxycodone, ropinirole, solifenacin, and tamoxifen. Also, is allergic to tramadol.  Review of Systems  Constitutional:  Positive for malaise/fatigue. Negative for chills and fever.  HENT:  Negative for congestion, sinus pain and sore throat.   Eyes:  Negative for blurred vision and pain.  Respiratory:  Negative for cough and wheezing.   Cardiovascular:  Negative for chest pain and leg swelling.  Gastrointestinal:  Negative for abdominal pain, constipation, diarrhea, heartburn, nausea and vomiting.  Genitourinary:  Negative for dysuria, frequency, hematuria and urgency.  Musculoskeletal:  Negative for back pain, joint pain, myalgias and neck pain.  Skin:  Negative for itching and rash.  Neurological:  Negative for dizziness, tremors and weakness.  Endo/Heme/Allergies:  Does not bruise/bleed easily.  Psychiatric/Behavioral:  Positive for depression. The patient is nervous/anxious. The patient does not have insomnia.    Objective: BP (!) 150/98    Ht 5' 8.5" (1.74 m)    Wt 223 lb (101.2 kg)    BMI 33.41 kg/m  Physical Exam Constitutional:      General:  She is not in acute distress.    Appearance: She is well-developed.  Genitourinary:     Right Labia: No rash or tenderness.    Left Labia: No tenderness or rash.    No vaginal erythema or bleeding.      Right Adnexa: not tender and no mass present.    Left Adnexa: not tender and no mass present.    Cervical elongation present.     No  cervical motion tenderness, discharge, polyp or nabothian cyst.     Cervical exam comments: Small polyp, contact bleeding.     Uterus is not enlarged.     No uterine mass detected.    Uterus is midaxial.     Pelvic exam was performed with patient in the lithotomy position.  HENT:     Head: Normocephalic and atraumatic.     Nose: Nose normal.  Abdominal:     General: There is no distension.     Palpations: Abdomen is soft.     Tenderness: There is no abdominal tenderness.  Musculoskeletal:        General: Normal range of motion.  Neurological:     Mental Status: She is alert and oriented to person, place, and time.     Cranial Nerves: No cranial nerve deficit.  Skin:    General: Skin is warm and dry.  Psychiatric:        Attention and Perception: Attention normal.        Mood and Affect: Mood and affect normal.        Speech: Speech normal.        Behavior: Behavior normal.        Thought Content: Thought content normal.        Judgment: Judgment normal.    ASSESSMENT/PLAN:   Problem List Items Addressed This Visit     Abnormal uterine bleeding    -  Primary   Relevant Orders   Surgical pathology from [facility], see below Ultrasound   H/O tamoxifen therapy       Relevant Orders   Plans to continue, as Onc recommends 5 more years.   Screening for malignant neoplasm of cervix       Relevant Orders   Cytology - PAP   Screen for colon cancer       Relevant Orders   Ambulatory referral to Gastroenterology as is due for this and agrees      Endometrial Biopsy After discussion with the patient regarding her abnormal uterine bleeding I recommended that she proceed with an endometrial biopsy for further diagnosis. The risks, benefits, alternatives, and indications for an endometrial biopsy were discussed with the patient in detail. She understood the risks including infection, bleeding, cervical laceration and uterine perforation.  Verbal consent was obtained.   PROCEDURE NOTE:   [place] endometrial biopsy was performed using aseptic technique with iodine preparation.  The uterus was sounded to a length of 4 cm, met with resistance (Adhesions from prior Ablation).  Adequate sampling was not obtained with minimal blood loss. Will still send tissue I obtained to be tested. The patient tolerated the procedure well.  Disposition will be pending pathology.  Barnett Applebaum, MD, [facility], [facility] 06/28/2021  10:06 AM

## 2021-06-28 NOTE — Telephone Encounter (Signed)
Called spoke with patient she wants to call us when she is ready to schedule

## 2021-07-02 ENCOUNTER — Inpatient Hospital Stay: Payer: Managed Care, Other (non HMO)

## 2021-07-02 ENCOUNTER — Other Ambulatory Visit: Payer: Self-pay | Admitting: Genetic Counselor

## 2021-07-02 ENCOUNTER — Inpatient Hospital Stay: Payer: Managed Care, Other (non HMO) | Attending: Hematology | Admitting: Genetic Counselor

## 2021-07-02 DIAGNOSIS — C50411 Malignant neoplasm of upper-outer quadrant of right female breast: Secondary | ICD-10-CM

## 2021-07-02 LAB — SURGICAL PATHOLOGY

## 2021-07-02 NOTE — Addendum Note (Signed)
Addended by: Ignacia Bayley on: 07/02/2021 10:00 AM   Modules accepted: Orders

## 2021-07-03 ENCOUNTER — Ambulatory Visit
Admission: RE | Admit: 2021-07-03 | Discharge: 2021-07-03 | Disposition: A | Discharge status: Home / Self Care | Payer: Managed Care, Other (non HMO) | Source: Ambulatory Visit | Attending: Obstetrics & Gynecology | Admitting: Obstetrics & Gynecology

## 2021-07-03 DIAGNOSIS — N939 Abnormal uterine and vaginal bleeding, unspecified: Secondary | ICD-10-CM | POA: Diagnosis not present

## 2021-07-03 DIAGNOSIS — Z9229 Personal history of other drug therapy: Secondary | ICD-10-CM | POA: Insufficient documentation

## 2021-07-03 LAB — CYTOLOGY - PAP: Diagnosis: NEGATIVE

## 2021-07-04 ENCOUNTER — Telehealth: Payer: Self-pay | Admitting: Obstetrics & Gynecology

## 2021-07-04 ENCOUNTER — Other Ambulatory Visit: Payer: Self-pay | Admitting: Obstetrics & Gynecology

## 2021-07-04 DIAGNOSIS — Z9229 Personal history of other drug therapy: Secondary | ICD-10-CM

## 2021-07-04 DIAGNOSIS — N950 Postmenopausal bleeding: Secondary | ICD-10-CM

## 2021-07-04 DIAGNOSIS — N840 Polyp of corpus uteri: Secondary | ICD-10-CM

## 2021-07-04 DIAGNOSIS — R9389 Abnormal findings on diagnostic imaging of other specified body structures: Secondary | ICD-10-CM

## 2021-07-04 NOTE — Telephone Encounter (Signed)
Pt called in regarding her results in [place].  She would like to speak with you.

## 2021-07-04 NOTE — Progress Notes (Signed)
Surgery Booking Request Patient Full Name:  Susan Benton  MRN: [id number]  DOB: 03/26/1971  Surgeon: Hoyt Koch, MD  Requested Surgery Date and Time: next available Primary Diagnosis AND Code: Postmenopausal Bleeding, Endometrial thickening by ultrasound Secondary Diagnosis and Code: Endometrial polyp, history of Tamoxifen use  1. H/O tamoxifen therapy  Z92.29   2. Postmenopausal bleeding  N95.0   3. Endometrial thickening on ultrasound  R93.89   4. Endometrial polyp  N84.0  Surgical Procedure: Hysteroscopy D&C RNFA Requested?: No L&D Notification: No Admission Status: same day surgery Length of Surgery: 25 min Special Case Needs: Myosure available H&P: Yes Phone Interview???:  Yes Interpreter: No Medical Clearance:  No Special Scheduling Instructions: No Any known health/anesthesia issues, diabetes, sleep apnea, latex allergy, defibrillator/pacemaker?: No Acuity: P2   (P1 highest, P2 delay may cause harm, P3 low, elective gyn, P4 lowest) Post op follow up visits: 2 weeks

## 2021-07-05 ENCOUNTER — Telehealth: Payer: Self-pay

## 2021-07-05 ENCOUNTER — Encounter: Payer: Self-pay | Admitting: Hematology

## 2021-07-05 NOTE — Telephone Encounter (Signed)
Can add Laparoscopy and Bilateral Oophorectomy to surgical schedule, and will discuss more with her at time of appointment.

## 2021-07-05 NOTE — Telephone Encounter (Signed)
-----   Message from Gae Dry, MD sent at 07/04/2021  9:19 AM EST ----- Surgery Booking Request Patient Full Name:  Susan Benton  MRN: [id number]  DOB: October 31, 1970  Surgeon: Hoyt Koch, MD  Requested Surgery Date and Time: next available Primary Diagnosis AND Code: Postmenopausal Bleeding, Endometrial thickening by ultrasound Secondary Diagnosis and Code: Endometrial polyp, history of Tamoxifen use  1. H/O tamoxifen therapy  Z92.29   2. Postmenopausal bleeding  N95.0   3. Endometrial thickening on ultrasound  R93.89   4. Endometrial polyp  N84.0  Surgical Procedure: Hysteroscopy D&C RNFA Requested?: No L&D Notification: No Admission Status: same day surgery Length of Surgery: 25 min Special Case Needs: Myosure available H&P: Yes Phone Interview???:  Yes Interpreter: No Medical Clearance:  No Special Scheduling Instructions: No Any known health/anesthesia issues, diabetes, sleep apnea, latex allergy, defibrillator/pacemaker?: No Acuity: P2   (P1 highest, P2 delay may cause harm, P3 low, elective gyn, P4 lowest) Post op follow up visits: 2 weeks

## 2021-07-05 NOTE — Telephone Encounter (Signed)
Called patient to schedule hysteroscopy D&C w Kenton Kingfisher - she mentioned that she had discussed coming off of tamoxifen with Dr Burr Medico. They discussed that she would need oophorectomy in order to do that. Pt says she's had several side effects from the tamoxifen and would like to change and asked if you could do the ovary removal. I told her that I would bring this up with you. She also plans to send a msg to [place] requesting this.  DOS 3/2  H&P 2/21 @ 10:35   Pre-admit phone call appointment to be requested - date and time will be included on H&P paper work. Also all appointments will be updated on pt MyChart. Explained that this appointment has a call window. Based on the time scheduled will indicate if the call will be received within a 4 hour window before 1:00 or after.  Advised that pt may also receive calls from the hospital pharmacy and pre-service center.  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 No secondary insurance.

## 2021-07-07 ENCOUNTER — Encounter: Payer: Self-pay | Admitting: Hematology

## 2021-07-12 ENCOUNTER — Telehealth: Payer: Self-pay

## 2021-07-12 NOTE — Telephone Encounter (Signed)
Pt left msg on triage asking about how long will she stay in hospital after surgery on 07/26/21. She knows she has pre-op on 2/21 but she is trying to make arrangements for her hospital stay since she currently doesn't have anymore here.

## 2021-07-13 ENCOUNTER — Encounter: Payer: Self-pay | Admitting: Obstetrics & Gynecology

## 2021-07-14 NOTE — Telephone Encounter (Signed)
Typical surgery like this (hysterectomy) is outpatient, meaning she will go home after a couple of hours after surgery.  She will need someone to be with her at home the first night.

## 2021-07-16 ENCOUNTER — Other Ambulatory Visit: Payer: Self-pay | Admitting: Obstetrics & Gynecology

## 2021-07-16 ENCOUNTER — Telehealth: Payer: Self-pay

## 2021-07-16 DIAGNOSIS — R9389 Abnormal findings on diagnostic imaging of other specified body structures: Secondary | ICD-10-CM

## 2021-07-16 DIAGNOSIS — N950 Postmenopausal bleeding: Secondary | ICD-10-CM

## 2021-07-16 NOTE — Telephone Encounter (Signed)
Called pt, no answer, LVMTRC. 

## 2021-07-16 NOTE — Telephone Encounter (Signed)
Pt called to rtn a call that was missed. I adv that it may have been the front calling to reschedule her for her H&P with RPH. I adv that he is out unexpectedly and would need to reschedule her appt. She asked for a telephone appt. Being that [facility] is way overbooked prior to her DOS I put her on his schedule for 2/28 with a call possibly being made to her before clinic hours (7:30am - 8:00am). She was fine with this.  She asked if [facility] had been in contact with Dr Annamaria Boots in regards to planning for her surgery? I adv that I was not sure but that she is scheduled for a hysteroscopy D&C as well as a lap oophorectomy. She was thinking that she was having a hysterectomy per her discussions with Dr Annamaria Boots. She also questioned if she would retain her cervix. I told her that I would have to direct her questions to [facility] as I am not aware if he had spoken with Dr Annamaria Boots.

## 2021-07-16 NOTE — Telephone Encounter (Signed)
Pt aware.

## 2021-07-17 ENCOUNTER — Encounter: Payer: Managed Care, Other (non HMO) | Admitting: Obstetrics & Gynecology

## 2021-07-18 ENCOUNTER — Encounter
Admission: RE | Admit: 2021-07-18 | Discharge: 2021-07-18 | Disposition: A | Discharge status: Home / Self Care | Payer: Managed Care, Other (non HMO) | Source: Ambulatory Visit | Attending: Obstetrics & Gynecology | Admitting: Obstetrics & Gynecology

## 2021-07-18 ENCOUNTER — Other Ambulatory Visit: Payer: Self-pay

## 2021-07-18 HISTORY — DX: Personal history of urinary calculi: Z87.442

## 2021-07-18 HISTORY — DX: Other specified postprocedural states: Z98.890

## 2021-07-18 HISTORY — DX: Nausea with vomiting, unspecified: R11.2

## 2021-07-18 HISTORY — DX: Acute infarction of intestine, part and extent unspecified: K55.069

## 2021-07-18 NOTE — Patient Instructions (Signed)
Your procedure is scheduled on:07-26-21 Thursday Report to the Registration Desk on the 1st floor of the [facility].Then proceed to the 2nd floor Surgery Desk in the [facility] To find out your arrival time, please call [phone] between 1PM - 3PM on:07-25-21 Wednesday  REMEMBER: Instructions that are not followed completely may result in serious medical risk, up to and including death; or upon the discretion of your surgeon and anesthesiologist your surgery may need to be rescheduled.  Do not eat food after midnight the night before surgery.  No gum chewing, lozengers or hard candies.  You may however, drink CLEAR liquids up to 2 hours before you are scheduled to arrive for your surgery. Do not drink anything within 2 hours of your scheduled arrival time.  Clear liquids include: - water  - apple juice without pulp - gatorade (not RED colors) - black coffee or tea (Do NOT add milk or creamers to the coffee or tea) Do NOT drink anything that is not on this list.  TAKE THESE MEDICATIONS THE MORNING OF SURGERY WITH A SIP OF WATER: -ALPRAZolam Duanne Moron)  -solifenacin (VESICARE)   Ask Dr Kenton Kingfisher on 07-20-21 at your appointment when he wants you to stop your aspirin 81 MG EC tablet and clopidogrel (PLAVIX)   One week prior to surgery: Stop Anti-inflammatories (NSAIDS) such as Advil, Aleve, Ibuprofen, Motrin, Naproxen, Naprosyn and Aspirin based products such as Excedrin, Goodys Powder, BC Powder.You may however, continue to take Tylenol/Oxycodone if needed for pain up until the day of surgery.  Stop ANY OVER THE COUNTER supplements/vitamins NOW (07-18-21) until after surgery  No Alcohol for 24 hours before or after surgery.  No Smoking including e-cigarettes for 24 hours prior to surgery.  No chewable tobacco products for at least 6 hours prior to surgery.  No nicotine patches on the day of surgery.  Do not use any "recreational" drugs for at least a week prior to your surgery.  Please  be advised that the combination of cocaine and anesthesia may have negative outcomes, up to and including death. If you test positive for cocaine, your surgery will be cancelled.  On the morning of surgery brush your teeth with toothpaste and water, you may rinse your mouth with mouthwash if you wish. Do not swallow any toothpaste or mouthwash.  Use CHG Soap as directed on instruction sheet.  Do not wear jewelry, make-up, hairpins, clips or nail polish.  Do not wear lotions, powders, or perfumes.   Do not shave body from the neck down 48 hours prior to surgery just in case you cut yourself which could leave a site for infection.  Also, freshly shaved skin may become irritated if using the CHG soap.  Contact lenses, hearing aids and dentures may not be worn into surgery.  Do not bring valuables to the hospital. [facility] is not responsible for any missing/lost belongings or valuables.  Notify your doctor if there is any change in your medical condition (cold, fever, infection).  Wear comfortable clothing (specific to your surgery type) to the hospital.  After surgery, you can help prevent lung complications by doing breathing exercises.  Take deep breaths and cough every 1-2 hours. Your doctor may order a device called an Incentive Spirometer to help you take deep breaths. When coughing or sneezing, hold a pillow firmly against your incision with both hands. This is called splinting. Doing this helps protect your incision. It also decreases belly discomfort.  If you are being admitted to the  hospital overnight, leave your suitcase in the car. After surgery it may be brought to your room.  If you are being discharged the day of surgery, you will not be allowed to drive home. You will need a responsible adult (18 years or older) to drive you home and stay with you that night.   If you are taking public transportation, you will need to have a responsible adult (18 years or older)  with you. Please confirm with your physician that it is acceptable to use public transportation.   Please call the [facility] [place] Dept. at [phone] if you have any questions about these instructions.  Surgery Visitation Policy:  Patients undergoing a surgery or procedure may have one family member or support person with them as long as that person is not COVID-19 positive or experiencing its symptoms.  That person may remain in the waiting area during the procedure and may rotate out with other people.  Inpatient Visitation:    Visiting hours are 7 a.m. to 8 p.m. Up to two visitors ages 16+ are allowed at one time in a patient room. The visitors may rotate out with other people during the day. Visitors must check out when they leave, or other visitors will not be allowed. One designated support person may remain overnight. The visitor must pass COVID-19 screenings, use hand sanitizer when entering and exiting the patients room and wear a mask at all times, including in the patients room. Patients must also wear a mask when staff or their visitor are in the room. Masking is required regardless of vaccination status.

## 2021-07-20 ENCOUNTER — Other Ambulatory Visit: Payer: Self-pay

## 2021-07-20 ENCOUNTER — Ambulatory Visit (INDEPENDENT_AMBULATORY_CARE_PROVIDER_SITE_OTHER): Payer: Managed Care, Other (non HMO) | Admitting: Obstetrics & Gynecology

## 2021-07-20 ENCOUNTER — Encounter: Payer: Self-pay | Admitting: Obstetrics & Gynecology

## 2021-07-20 ENCOUNTER — Encounter
Admission: RE | Admit: 2021-07-20 | Discharge: 2021-07-20 | Disposition: A | Discharge status: Home / Self Care | Payer: Managed Care, Other (non HMO) | Source: Ambulatory Visit | Attending: Obstetrics & Gynecology | Admitting: Obstetrics & Gynecology

## 2021-07-20 VITALS — BP 130/80 | Ht 69.0 in | Wt 233.0 lb

## 2021-07-20 DIAGNOSIS — N950 Postmenopausal bleeding: Secondary | ICD-10-CM | POA: Insufficient documentation

## 2021-07-20 DIAGNOSIS — R9389 Abnormal findings on diagnostic imaging of other specified body structures: Secondary | ICD-10-CM | POA: Diagnosis not present

## 2021-07-20 DIAGNOSIS — I10 Essential (primary) hypertension: Secondary | ICD-10-CM | POA: Diagnosis not present

## 2021-07-20 DIAGNOSIS — Z9229 Personal history of other drug therapy: Secondary | ICD-10-CM

## 2021-07-20 DIAGNOSIS — C50411 Malignant neoplasm of upper-outer quadrant of right female breast: Secondary | ICD-10-CM

## 2021-07-20 DIAGNOSIS — Z01818 Encounter for other preprocedural examination: Secondary | ICD-10-CM | POA: Diagnosis present

## 2021-07-20 DIAGNOSIS — Z170 Estrogen receptor positive status [ER+]: Secondary | ICD-10-CM

## 2021-07-20 LAB — TYPE AND SCREEN
ABO/RH(D): AB POS
Antibody Screen: NEGATIVE

## 2021-07-20 LAB — CBC
HCT: 37.6 % (ref 36.0–46.0)
Hemoglobin: 12.2 g/dL (ref 12.0–15.0)
MCH: 27.7 pg (ref 26.0–34.0)
MCHC: 32.4 g/dL (ref 30.0–36.0)
MCV: 85.5 fL (ref 80.0–100.0)
Platelets: 320 10*3/uL (ref 150–400)
RBC: 4.4 MIL/uL (ref 3.87–5.11)
RDW: 12.5 % (ref 11.5–15.5)
WBC: 9.9 10*3/uL (ref 4.0–10.5)
nRBC: 0 % (ref 0.0–0.2)

## 2021-07-20 NOTE — Patient Instructions (Signed)
Total Laparoscopic Hysterectomy A total laparoscopic hysterectomy is a minimally invasive surgery to remove the uterus and cervix. The fallopian tubes and ovaries can also be removed during this surgery, if necessary. This procedure may be done to treat problems such as: Growths in the uterus (uterine fibroids) that are not cancer but cause symptoms. A condition that causes the lining of the uterus to grow in other areas (endometriosis). Problems with pelvic support. Cancer of the cervix, ovaries, uterus, or tissue that lines the uterus (endometrium). Excessive bleeding in the uterus. After this procedure, you will no longer be able to have a baby, and you will no longer have a menstrual period. Tell a health care provider about: Any allergies you have. All medicines you are taking, including vitamins, herbs, eye drops, creams, and over-the-counter medicines. Any problems you or family members have had with anesthetic medicines. Any blood disorders you have. Any surgeries you have had. Any medical conditions you have. Whether you are pregnant or may be pregnant. What are the risks? Generally, this is a safe procedure. However, problems may occur, including: Infection. Bleeding. Blood clots in the legs or lungs. Allergic reactions to medicines. Damage to nearby structures or organs. Having to change from this surgery to one in which a large incision is made in the abdomen (abdominal hysterectomy). What happens before the procedure? Staying hydrated Follow instructions from your health care provider about hydration, which may include: Up to 2 hours before the procedure - you may continue to drink clear liquids, such as water, clear fruit juice, black coffee, and plain tea.  Eating and drinking restrictions Follow instructions from your health care provider about eating and drinking, which may include: 8 hours before the procedure - stop eating heavy meals or foods, such as meat, fried  foods, or fatty foods. 6 hours before the procedure - stop eating light meals or foods, such as toast or cereal. 6 hours before the procedure - stop drinking milk or drinks that contain milk. 2 hours before the procedure - stop drinking clear liquids. Medicines Ask your health care provider about: Changing or stopping your regular medicines. This is especially important if you are taking diabetes medicines or blood thinners. Taking medicines such as aspirin and ibuprofen. These medicines can thin your blood. Do not take these medicines unless your health care provider tells you to take them. Taking over-the-counter medicines, vitamins, herbs, and supplements. You may be asked to take medicine that helps you have a bowel movement (laxative) to prevent constipation. General instructions If you were asked to do bowel preparation before the procedure, follow instructions from your health care provider. This procedure can affect the way you feel about yourself. Talk with your health care provider about the physical and emotional changes hysterectomy may cause. Do not use any products that contain nicotine or tobacco for at least 4 weeks before the procedure. These products include cigarettes, chewing tobacco, and vaping devices, such as e-cigarettes. If you need help quitting, ask your health care provider. Plan to have a responsible adult take you home from the hospital or clinic. Plan to have a responsible adult care for you for the time you are told after you leave the hospital or clinic. This is important. Surgery safety Ask your health care provider: How your surgery site will be marked. What steps will be taken to help prevent infection. These may include: Removing hair at the surgery site. Washing skin with a germ-killing soap. Receiving antibiotic medicine. What happens during the  procedure? An IV will be inserted into one of your veins. You will be given one or more of the following: A  medicine to help you relax (sedative). A medicine to make you fall asleep (general anesthetic). A medicine to numb the area (local anesthetic). A medicine that is injected into your spine to numb the area below and slightly above the injection site (spinal anesthetic). A medicine that is injected into an area of your body to numb everything below the injection site (regional anesthetic). A gas will be used to inflate your abdomen. This will allow your surgeon to look inside your abdomen and do the surgery. Three or four small incisions will be made in your abdomen. A small device with a light (laparoscope) will be inserted into one of your incisions. Surgical instruments will be inserted through the other incisions in order to perform the procedure. Your uterus and cervix may be removed through your vagina or cut into small pieces and removed through the small incisions. Any other organs that need to be removed will also be removed this way. The gas will be released from inside your abdomen. Your incisions will be closed with stitches (sutures), skin glue, or adhesive strips. A bandage (dressing) may be placed over your incisions. The procedure may vary among health care providers and hospitals. What happens after the procedure? Your blood pressure, heart rate, breathing rate, and blood oxygen level will be monitored until you leave the hospital or clinic. You will be given medicine for pain as needed. You will be encouraged to walk as soon as possible. You will also use a device to help you breathe or do breathing exercises to keep your lungs clear. You may have to wear compression stockings. These stockings help to prevent blood clots and reduce swelling in your legs. You will need to wear a sanitary pad for vaginal discharge or bleeding. Summary Total laparoscopic hysterectomy is a procedure to remove your uterus, cervix, and sometimes the fallopian tubes and ovaries. This procedure can  affect the way you feel about yourself. Talk with your health care provider about the physical and emotional changes hysterectomy may cause. After this procedure, you will no longer be able to have a baby, and you will no longer have a menstrual period. You will be given pain medicine to control discomfort after this procedure. Plan to have a responsible adult take you home from the hospital or clinic. This information is not intended to replace advice given to you by your health care provider. Make sure you discuss any questions you have with your health care provider. Document Revised: 01/14/2020 Document Reviewed: 01/14/2020 Elsevier Patient Education  [employer].

## 2021-07-20 NOTE — H&P (View-Only) (Signed)
PRE-OPERATIVE HISTORY AND PHYSICAL EXAM  HPI:  Susan Benton is a 51 y.o. G2P2 No LMP recorded. Patient has had an ablation.; she is being admitted for surgery related to  postmenopausal bleeding as well as breast cancer and high risk ovarian cancer risks, and risks of Tamoxifen use.  She is to have complete hysterectomy.   She was diagnosed an treated for breast cancer in 2018 [facility]) followed by Tamoxifen daily therapy for the last 5 years.  She also had endometrial ablation in 2018, which did not stop periods.  However, 3 years ago they did indeed stop.  Then, in Nov 2022 she had a brown to red period, followed by one in Dec, and none in Jan.  Desires to come off of Tamoxifen, and needs this surgery for this reason as well.  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[place])    after taking tamoxifan-on plavix and asa   PONV (postoperative nausea and vomiting)    Restless legs syndrome    Past Surgical History:  Procedure Laterality Date   AXILLARY SENTINEL NODE BIOPSY Right 11/20/2016   Procedure: AXILLARY SENTINEL NODE BIOPSY;  Surgeon: Aviva Signs, MD;  Location: [facility];  Service: General;  Laterality: Right;  Sentinel Node Injection @ 10:30am   BLADDER REPAIR  2022   BREAST RECONSTRUCTION WITH PLACEMENT OF TISSUE EXPANDER AND FLEX HD (ACELLULAR HYDRATED DERMIS) Bilateral 12/25/2016   Procedure: BREAST RECONSTRUCTION WITH PLACEMENT OF TISSUE EXPANDER AND FLEX HD (ACELLULAR HYDRATED DERMIS);  Surgeon: Wallace Going, DO;  Location: [facility];  Service: Plastics;  Laterality: Bilateral;   CARDIAC CATHETERIZATION     DILITATION & CURRETTAGE/HYSTROSCOPY WITH NOVASURE ABLATION N/A 10/23/2016   Procedure: DILATATION & CURETTAGE/HYSTEROSCOPY WITH NOVASURE ENDOMETRIAL ABLATION;  Surgeon: Florian Buff, MD;  Location: [facility];  Service: Gynecology;  Laterality: N/A;   KIDNEY CYST REMOVAL     MASTECTOMY MODIFIED RADICAL Right 10/23/2016   Procedure: MASTECTOMY MODIFIED RADICAL;  Surgeon: Aviva Signs, MD;  Location: [facility];  Service: General;  Laterality: Right;   ROTATOR CUFF REPAIR     SIMPLE MASTECTOMY WITH AXILLARY SENTINEL NODE BIOPSY Left 10/23/2016   Procedure: SIMPLE MASTECTOMY;  Surgeon: Aviva Signs, MD;  Location: [facility];  Service: General;  Laterality: Left;   TISSUE EXPANDER PLACEMENT Bilateral 02/27/2017   Procedure: REMOVAL TISSUE EXPANDER;  Surgeon: Wallace Going, DO;  Location: [place] [facility];  Service: Plastics;  Laterality: Bilateral;   TONSILECTOMY, ADENOIDECTOMY, BILATERAL MYRINGOTOMY AND TUBES Bilateral 1984   TUBAL LIGATION  1998   2000   Family History  Problem Relation Age of Onset   Hypertension Mother    Cancer Mother        skin cancer   Diabetes Father    Hypertension Father    Kidney disease Father    Hyperlipidemia Father    Asthma Father    COPD Father    Heart disease Father    HIV/AIDS Brother  Cancer Maternal Grandmother        breast   Breast cancer Maternal Grandmother    Hypertension Son    Breast cancer Other        cousin had breast cancer   Social History   Tobacco Use   Smoking status: Former    Packs/day: 0.25    Years: 20.00    Pack years: 5.00    Types: Cigarettes    Quit date: 11/2020    Years since quitting: 0.6   Smokeless tobacco: Never   Tobacco comments:    3-4 per day  Vaping Use   Vaping Use: Former  Substance Use Topics   Alcohol use: No   Drug use: No    Current Outpatient Medications:    ALPRAZolam (XANAX) 0.5 MG tablet, TAKE 1 TABLET BY  MOUTH TWICE A DAY AS NEEDED FOR ANXIETY (Patient taking differently: Take 0.5 mg by mouth 2 (two) times daily.), Disp: 30 tablet, Rfl: 0   aspirin 81 MG EC tablet, Take 81 mg by mouth daily., Disp: , Rfl:    atenolol (TENORMIN) 100 MG tablet, Take 1 tablet (100 mg total) by mouth daily. (Patient taking differently: Take 100 mg by mouth at bedtime.), Disp: 30 tablet, Rfl: 1   clopidogrel (PLAVIX) 75 MG tablet, Take 75 mg by mouth daily., Disp: , Rfl:    gabapentin (NEURONTIN) 300 MG capsule, TAKE ONE CAPSULE BY MOUTH 3 TIMES A DAY (Patient taking differently: Take 300 mg by mouth daily as needed (Nerve pain).), Disp: 90 capsule, Rfl: 2   methocarbamol (ROBAXIN) 500 MG tablet, Take 1 tablet (500 mg total) by mouth at bedtime as needed for muscle spasms., Disp: 10 tablet, Rfl: 0   olmesartan (BENICAR) 40 MG tablet, Take 40 mg by mouth daily., Disp: , Rfl:    oxyCODONE (ROXICODONE) 15 MG immediate release tablet, Take 1 tablet (15 mg total) by mouth every 6 (six) hours as needed for pain., Disp: 30 tablet, Rfl: 0   rOPINIRole (REQUIP) 2 MG tablet, 1-2 tabs po qhs (Patient taking differently: Take 2 mg by mouth daily as needed (Restless leg at bedtime).), Disp: 60 tablet, Rfl: 3   solifenacin (VESICARE) 10 MG tablet, Take 10 mg by mouth every morning., Disp: , Rfl:    tamoxifen (NOLVADEX) 20 MG tablet, TAKE 1 TABLET BY MOUTH EVERYDAY AT BEDTIME (Patient taking differently: Take 20 mg by mouth at bedtime.), Disp: 90 tablet, Rfl: 0 Allergies: Tramadol  Review of Systems  Constitutional:  Negative for chills, fever and malaise/fatigue.  HENT:  Negative for congestion, sinus pain and sore throat.   Eyes:  Negative for blurred vision and pain.  Respiratory:  Negative for cough and wheezing.   Cardiovascular:  Negative for chest pain and leg swelling.  Gastrointestinal:  Negative for abdominal pain, constipation, diarrhea, heartburn, nausea and vomiting.  Genitourinary:  Negative for dysuria, frequency,  hematuria and urgency.  Musculoskeletal:  Negative for back pain, joint pain, myalgias and neck pain.  Skin:  Negative for itching and rash.  Neurological:  Negative for dizziness, tremors and weakness.  Endo/Heme/Allergies:  Does not bruise/bleed easily.  Psychiatric/Behavioral:  Negative for depression. The patient is not nervous/anxious and does not have insomnia.    Objective: BP 130/80    Ht 5\' 9"  (1.753 m)    Wt 233 lb (105.7 kg)    BMI 34.41 kg/m   Filed Weights   07/20/21 1501  Weight: 233 lb (105.7 kg)   Physical Exam Constitutional:  General: She is not in acute distress.    Appearance: She is well-developed.  HENT:     Head: Normocephalic and atraumatic. No laceration.     Right Ear: Hearing normal.     Left Ear: Hearing normal.     Mouth/Throat:     Pharynx: Uvula midline.  Eyes:     Pupils: Pupils are equal, round, and reactive to light.  Neck:     Thyroid: No thyromegaly.  Cardiovascular:     Rate and Rhythm: Normal rate and regular rhythm.     Heart sounds: No murmur heard.   No friction rub. No gallop.  Pulmonary:     Effort: Pulmonary effort is normal. No respiratory distress.     Breath sounds: Normal breath sounds. No wheezing.  Abdominal:     General: Bowel sounds are normal. There is no distension.     Palpations: Abdomen is soft.     Tenderness: There is no abdominal tenderness. There is no rebound.  Musculoskeletal:        General: Normal range of motion.     Cervical back: Normal range of motion and neck supple.  Neurological:     Mental Status: She is alert and oriented to person, place, and time.     Cranial Nerves: No cranial nerve deficit.  Skin:    General: Skin is warm and dry.  Psychiatric:        Judgment: Judgment normal.  Vitals reviewed.    Assessment: 1. Postmenopausal bleeding   2. Endometrial thickening on ultrasound   3. H/O tamoxifen therapy   4. Malignant neoplasm of upper-outer quadrant of right breast in female,  estrogen receptor positive ([place])   Plan TLH BSO Cysto Discussed pros and cons of ovarian removal, including benefits of lowering risk of ovarian cancer and allowing use of better alternatives to Tamoxifen therapy moving forward in her breast cancer treatment.  I have had a careful discussion with this patient about all the options available and the risk/benefits of each. I have fully informed this patient that surgery may subject her to a variety of discomforts and risks: She understands that most patients have surgery with little difficulty, but problems can happen ranging from minor to fatal. These include nausea, vomiting, pain, bleeding, infection, poor healing, hernia, or formation of adhesions. Unexpected reactions may occur from any drug or anesthetic given. Unintended injury may occur to other pelvic or abdominal structures such as Fallopian tubes, ovaries, bladder, ureter (tube from kidney to bladder), or bowel. Nerves going from the pelvis to the legs may be injured. Any such injury may require immediate or later additional surgery to correct the problem. Excessive blood loss requiring transfusion is very unlikely but possible. Dangerous blood clots may form in the legs or lungs. Physical and sexual activity will be restricted in varying degrees for an indeterminate period of time but most often 2-6 weeks.  Finally, she understands that it is impossible to list every possible undesirable effect and that the condition for which surgery is done is not always cured or significantly improved, and in rare cases may be even worse.Ample time was given to answer all questions.  Barnett Applebaum, MD, [facility], [facility] 07/20/2021  3:48 PM

## 2021-07-20 NOTE — Progress Notes (Signed)
PRE-OPERATIVE HISTORY AND PHYSICAL EXAM  HPI:  Susan Benton is a 51 y.o. G2P2 No LMP recorded. Patient has had an ablation.; she is being admitted for surgery related to  postmenopausal bleeding as well as breast cancer and high risk ovarian cancer risks, and risks of Tamoxifen use.  She is to have complete hysterectomy.   She was diagnosed an treated for breast cancer in 2018 [facility]) followed by Tamoxifen daily therapy for the last 5 years.  She also had endometrial ablation in 2018, which did not stop periods.  However, 3 years ago they did indeed stop.  Then, in Nov 2022 she had a brown to red period, followed by one in Dec, and none in Jan.  Desires to come off of Tamoxifen, and needs this surgery for this reason as well.  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[place])    after taking tamoxifan-on plavix and asa   PONV (postoperative nausea and vomiting)    Restless legs syndrome    Past Surgical History:  Procedure Laterality Date   AXILLARY SENTINEL NODE BIOPSY Right 11/20/2016   Procedure: AXILLARY SENTINEL NODE BIOPSY;  Surgeon: Aviva Signs, MD;  [facility];  Service: General;  Laterality: Right;  Sentinel Node Injection @ 10:30am   BLADDER REPAIR  2022   BREAST RECONSTRUCTION WITH PLACEMENT OF TISSUE EXPANDER AND FLEX HD (ACELLULAR HYDRATED DERMIS) Bilateral 12/25/2016   Procedure: BREAST RECONSTRUCTION WITH PLACEMENT OF TISSUE EXPANDER AND FLEX HD (ACELLULAR HYDRATED DERMIS);  Surgeon: Wallace Going, DO;  Location: [facility];  Service: Plastics;  Laterality: Bilateral;   CARDIAC CATHETERIZATION     DILITATION & CURRETTAGE/HYSTROSCOPY WITH NOVASURE ABLATION N/A 10/23/2016   Procedure: DILATATION & CURETTAGE/HYSTEROSCOPY WITH NOVASURE ENDOMETRIAL ABLATION;  Surgeon: Florian Buff, MD;  [facility];  Service: Gynecology;  Laterality: N/A;   KIDNEY CYST REMOVAL     MASTECTOMY MODIFIED RADICAL Right 10/23/2016   Procedure: MASTECTOMY MODIFIED RADICAL;  Surgeon: Aviva Signs, MD;  [facility];  Service: General;  Laterality: Right;   ROTATOR CUFF REPAIR     SIMPLE MASTECTOMY WITH AXILLARY SENTINEL NODE BIOPSY Left 10/23/2016   Procedure: SIMPLE MASTECTOMY;  Surgeon: Aviva Signs, MD;  Location: [facility];  Service: General;  Laterality: Left;   TISSUE EXPANDER PLACEMENT Bilateral 02/27/2017   Procedure: REMOVAL TISSUE EXPANDER;  Surgeon: Wallace Going, DO;  Location: [facility];  Service: Plastics;  Laterality: Bilateral;   TONSILECTOMY, ADENOIDECTOMY, BILATERAL MYRINGOTOMY AND TUBES Bilateral 1984   TUBAL LIGATION  1998   2000   Family History  Problem Relation Age of Onset   Hypertension Mother    Cancer Mother        skin cancer   Diabetes Father    Hypertension Father    Kidney disease Father    Hyperlipidemia Father    Asthma Father    COPD Father    Heart disease Father    HIV/AIDS Brother  Cancer Maternal Grandmother        breast   Breast cancer Maternal Grandmother    Hypertension Son    Breast cancer Other        cousin had breast cancer   Social History   Tobacco Use   Smoking status: Former    Packs/day: 0.25    Years: 20.00    Pack years: 5.00    Types: Cigarettes    Quit date: 11/2020    Years since quitting: 0.6   Smokeless tobacco: Never   Tobacco comments:    3-4 per day  Vaping Use   Vaping Use: Former  Substance Use Topics   Alcohol use: No   Drug use: No    Current Outpatient Medications:    ALPRAZolam (XANAX) 0.5 MG tablet, TAKE 1 TABLET BY  MOUTH TWICE A DAY AS NEEDED FOR ANXIETY (Patient taking differently: Take 0.5 mg by mouth 2 (two) times daily.), Disp: 30 tablet, Rfl: 0   aspirin 81 MG EC tablet, Take 81 mg by mouth daily., Disp: , Rfl:    atenolol (TENORMIN) 100 MG tablet, Take 1 tablet (100 mg total) by mouth daily. (Patient taking differently: Take 100 mg by mouth at bedtime.), Disp: 30 tablet, Rfl: 1   clopidogrel (PLAVIX) 75 MG tablet, Take 75 mg by mouth daily., Disp: , Rfl:    gabapentin (NEURONTIN) 300 MG capsule, TAKE ONE CAPSULE BY MOUTH 3 TIMES A DAY (Patient taking differently: Take 300 mg by mouth daily as needed (Nerve pain).), Disp: 90 capsule, Rfl: 2   methocarbamol (ROBAXIN) 500 MG tablet, Take 1 tablet (500 mg total) by mouth at bedtime as needed for muscle spasms., Disp: 10 tablet, Rfl: 0   olmesartan (BENICAR) 40 MG tablet, Take 40 mg by mouth daily., Disp: , Rfl:    oxyCODONE (ROXICODONE) 15 MG immediate release tablet, Take 1 tablet (15 mg total) by mouth every 6 (six) hours as needed for pain., Disp: 30 tablet, Rfl: 0   rOPINIRole (REQUIP) 2 MG tablet, 1-2 tabs po qhs (Patient taking differently: Take 2 mg by mouth daily as needed (Restless leg at bedtime).), Disp: 60 tablet, Rfl: 3   solifenacin (VESICARE) 10 MG tablet, Take 10 mg by mouth every morning., Disp: , Rfl:    tamoxifen (NOLVADEX) 20 MG tablet, TAKE 1 TABLET BY MOUTH EVERYDAY AT BEDTIME (Patient taking differently: Take 20 mg by mouth at bedtime.), Disp: 90 tablet, Rfl: 0 Allergies: Tramadol  Review of Systems  Constitutional:  Negative for chills, fever and malaise/fatigue.  HENT:  Negative for congestion, sinus pain and sore throat.   Eyes:  Negative for blurred vision and pain.  Respiratory:  Negative for cough and wheezing.   Cardiovascular:  Negative for chest pain and leg swelling.  Gastrointestinal:  Negative for abdominal pain, constipation, diarrhea, heartburn, nausea and vomiting.  Genitourinary:  Negative for dysuria, frequency,  hematuria and urgency.  Musculoskeletal:  Negative for back pain, joint pain, myalgias and neck pain.  Skin:  Negative for itching and rash.  Neurological:  Negative for dizziness, tremors and weakness.  Endo/Heme/Allergies:  Does not bruise/bleed easily.  Psychiatric/Behavioral:  Negative for depression. The patient is not nervous/anxious and does not have insomnia.    Objective: BP 130/80    Ht 5\' 9"  (1.753 m)    Wt 233 lb (105.7 kg)    BMI 34.41 kg/m   Filed Weights   07/20/21 1501  Weight: 233 lb (105.7 kg)   Physical Exam Constitutional:  General: She is not in acute distress.    Appearance: She is well-developed.  HENT:     Head: Normocephalic and atraumatic. No laceration.     Right Ear: Hearing normal.     Left Ear: Hearing normal.     Mouth/Throat:     Pharynx: Uvula midline.  Eyes:     Pupils: Pupils are equal, round, and reactive to light.  Neck:     Thyroid: No thyromegaly.  Cardiovascular:     Rate and Rhythm: Normal rate and regular rhythm.     Heart sounds: No murmur heard.   No friction rub. No gallop.  Pulmonary:     Effort: Pulmonary effort is normal. No respiratory distress.     Breath sounds: Normal breath sounds. No wheezing.  Abdominal:     General: Bowel sounds are normal. There is no distension.     Palpations: Abdomen is soft.     Tenderness: There is no abdominal tenderness. There is no rebound.  Musculoskeletal:        General: Normal range of motion.     Cervical back: Normal range of motion and neck supple.  Neurological:     Mental Status: She is alert and oriented to person, place, and time.     Cranial Nerves: No cranial nerve deficit.  Skin:    General: Skin is warm and dry.  Psychiatric:        Judgment: Judgment normal.  Vitals reviewed.    Assessment: 1. Postmenopausal bleeding   2. Endometrial thickening on ultrasound   3. H/O tamoxifen therapy   4. Malignant neoplasm of upper-outer quadrant of right breast in female,  estrogen receptor positive ([place])   Plan TLH BSO Cysto Discussed pros and cons of ovarian removal, including benefits of lowering risk of ovarian cancer and allowing use of better alternatives to Tamoxifen therapy moving forward in her breast cancer treatment.  I have had a careful discussion with this patient about all the options available and the risk/benefits of each. I have fully informed this patient that surgery may subject her to a variety of discomforts and risks: She understands that most patients have surgery with little difficulty, but problems can happen ranging from minor to fatal. These include nausea, vomiting, pain, bleeding, infection, poor healing, hernia, or formation of adhesions. Unexpected reactions may occur from any drug or anesthetic given. Unintended injury may occur to other pelvic or abdominal structures such as Fallopian tubes, ovaries, bladder, ureter (tube from kidney to bladder), or bowel. Nerves going from the pelvis to the legs may be injured. Any such injury may require immediate or later additional surgery to correct the problem. Excessive blood loss requiring transfusion is very unlikely but possible. Dangerous blood clots may form in the legs or lungs. Physical and sexual activity will be restricted in varying degrees for an indeterminate period of time but most often 2-6 weeks.  Finally, she understands that it is impossible to list every possible undesirable effect and that the condition for which surgery is done is not always cured or significantly improved, and in rare cases may be even worse.Ample time was given to answer all questions.  Barnett Applebaum, MD, [facility], [facility] 07/20/2021  3:48 PM

## 2021-07-24 ENCOUNTER — Encounter: Payer: Managed Care, Other (non HMO) | Admitting: Obstetrics & Gynecology

## 2021-07-25 MED ORDER — POVIDONE-IODINE 10 % EX SWAB: 2.0000 "application " | Freq: Once | CUTANEOUS | Status: DC

## 2021-07-25 MED ORDER — CHLORHEXIDINE GLUCONATE 0.12 % MT SOLN: 15.0000 mL | Freq: Once | OROMUCOSAL | Status: AC

## 2021-07-25 MED ORDER — LACTATED RINGERS IV SOLN: INTRAVENOUS | Status: DC

## 2021-07-25 MED ORDER — APREPITANT 40 MG PO CAPS
40.0000 mg | ORAL_CAPSULE | Freq: Once | ORAL | Status: AC
Start: 2021-07-25 — End: 2021-07-26

## 2021-07-25 MED ORDER — SODIUM CHLORIDE 0.9 % IV SOLN
2.0000 g | INTRAVENOUS | Status: AC
  Administered 2021-07-26: 2 g via INTRAVENOUS

## 2021-07-25 MED ORDER — FAMOTIDINE 20 MG PO TABS
20.0000 mg | ORAL_TABLET | Freq: Once | ORAL | Status: AC
Start: 2021-07-25 — End: 2021-07-26

## 2021-07-25 MED ORDER — ORAL CARE MOUTH RINSE: 15.0000 mL | Freq: Once | OROMUCOSAL | Status: AC

## 2021-07-26 ENCOUNTER — Other Ambulatory Visit: Payer: Self-pay

## 2021-07-26 ENCOUNTER — Ambulatory Visit: Payer: Managed Care, Other (non HMO) | Admitting: Anesthesiology

## 2021-07-26 ENCOUNTER — Encounter: Payer: Self-pay | Admitting: Obstetrics & Gynecology

## 2021-07-26 ENCOUNTER — Other Ambulatory Visit: Payer: Self-pay | Admitting: Obstetrics & Gynecology

## 2021-07-26 ENCOUNTER — Encounter
Admission: RE | Disposition: A | Discharge status: Home / Self Care | Payer: Self-pay | Source: Home / Self Care | Attending: Obstetrics & Gynecology

## 2021-07-26 ENCOUNTER — Ambulatory Visit
Admission: RE | Admit: 2021-07-26 | Discharge: 2021-07-26 | Disposition: A | Discharge status: Home / Self Care | Payer: Managed Care, Other (non HMO) | Attending: Obstetrics & Gynecology | Admitting: Obstetrics & Gynecology

## 2021-07-26 ENCOUNTER — Ambulatory Visit: Admit: 2021-07-26 | Payer: Managed Care, Other (non HMO) | Admitting: Obstetrics & Gynecology

## 2021-07-26 ENCOUNTER — Ambulatory Visit: Payer: Managed Care, Other (non HMO) | Admitting: Urgent Care

## 2021-07-26 DIAGNOSIS — Z9013 Acquired absence of bilateral breasts and nipples: Secondary | ICD-10-CM | POA: Diagnosis not present

## 2021-07-26 DIAGNOSIS — I10 Essential (primary) hypertension: Secondary | ICD-10-CM | POA: Diagnosis not present

## 2021-07-26 DIAGNOSIS — N838 Other noninflammatory disorders of ovary, fallopian tube and broad ligament: Secondary | ICD-10-CM | POA: Insufficient documentation

## 2021-07-26 DIAGNOSIS — N879 Dysplasia of cervix uteri, unspecified: Secondary | ICD-10-CM | POA: Diagnosis not present

## 2021-07-26 DIAGNOSIS — F32A Depression, unspecified: Secondary | ICD-10-CM | POA: Diagnosis not present

## 2021-07-26 DIAGNOSIS — Z170 Estrogen receptor positive status [ER+]: Secondary | ICD-10-CM | POA: Insufficient documentation

## 2021-07-26 DIAGNOSIS — N83202 Unspecified ovarian cyst, left side: Secondary | ICD-10-CM | POA: Diagnosis not present

## 2021-07-26 DIAGNOSIS — Z853 Personal history of malignant neoplasm of breast: Secondary | ICD-10-CM | POA: Diagnosis not present

## 2021-07-26 DIAGNOSIS — N83201 Unspecified ovarian cyst, right side: Secondary | ICD-10-CM | POA: Insufficient documentation

## 2021-07-26 DIAGNOSIS — N920 Excessive and frequent menstruation with regular cycle: Secondary | ICD-10-CM

## 2021-07-26 DIAGNOSIS — R9389 Abnormal findings on diagnostic imaging of other specified body structures: Secondary | ICD-10-CM | POA: Insufficient documentation

## 2021-07-26 DIAGNOSIS — B182 Chronic viral hepatitis C: Secondary | ICD-10-CM | POA: Diagnosis not present

## 2021-07-26 DIAGNOSIS — N950 Postmenopausal bleeding: Secondary | ICD-10-CM | POA: Insufficient documentation

## 2021-07-26 DIAGNOSIS — N840 Polyp of corpus uteri: Secondary | ICD-10-CM | POA: Diagnosis not present

## 2021-07-26 DIAGNOSIS — Z87891 Personal history of nicotine dependence: Secondary | ICD-10-CM | POA: Insufficient documentation

## 2021-07-26 DIAGNOSIS — C50911 Malignant neoplasm of unspecified site of right female breast: Secondary | ICD-10-CM

## 2021-07-26 DIAGNOSIS — Z79810 Long term (current) use of selective estrogen receptor modulators (SERMs): Secondary | ICD-10-CM | POA: Insufficient documentation

## 2021-07-26 DIAGNOSIS — C50411 Malignant neoplasm of upper-outer quadrant of right female breast: Secondary | ICD-10-CM | POA: Diagnosis not present

## 2021-07-26 DIAGNOSIS — D251 Intramural leiomyoma of uterus: Secondary | ICD-10-CM | POA: Insufficient documentation

## 2021-07-26 DIAGNOSIS — D259 Leiomyoma of uterus, unspecified: Secondary | ICD-10-CM

## 2021-07-26 DIAGNOSIS — N841 Polyp of cervix uteri: Secondary | ICD-10-CM | POA: Insufficient documentation

## 2021-07-26 DIAGNOSIS — N888 Other specified noninflammatory disorders of cervix uteri: Secondary | ICD-10-CM | POA: Insufficient documentation

## 2021-07-26 DIAGNOSIS — F419 Anxiety disorder, unspecified: Secondary | ICD-10-CM | POA: Insufficient documentation

## 2021-07-26 DIAGNOSIS — Z803 Family history of malignant neoplasm of breast: Secondary | ICD-10-CM

## 2021-07-26 DIAGNOSIS — I2510 Atherosclerotic heart disease of native coronary artery without angina pectoris: Secondary | ICD-10-CM | POA: Diagnosis not present

## 2021-07-26 HISTORY — PX: CYSTOSCOPY: SHX5120

## 2021-07-26 HISTORY — PX: TOTAL LAPAROSCOPIC HYSTERECTOMY WITH BILATERAL SALPINGO OOPHORECTOMY: SHX6845

## 2021-07-26 SURGERY — HYSTERECTOMY, TOTAL, LAPAROSCOPIC, WITH BILATERAL SALPINGO-OOPHORECTOMY
Anesthesia: General | Laterality: Bilateral

## 2021-07-26 SURGERY — DILATATION & CURETTAGE/HYSTEROSCOPY WITH MYOSURE
Anesthesia: Choice

## 2021-07-26 MED ORDER — DEXMEDETOMIDINE (PRECEDEX) IN NS 20 MCG/5ML (4 MCG/ML) IV SYRINGE
PREFILLED_SYRINGE | INTRAVENOUS | Status: DC | PRN
  Administered 2021-07-26: 4 ug via INTRAVENOUS
  Administered 2021-07-26: 10 ug via INTRAVENOUS
  Administered 2021-07-26: 6 ug via INTRAVENOUS

## 2021-07-26 MED ORDER — MORPHINE SULFATE (PF) 2 MG/ML IV SOLN
1.0000 mg | INTRAVENOUS | Status: DC | PRN
  Administered 2021-07-26: 1 mg via INTRAVENOUS

## 2021-07-26 MED ORDER — KETOROLAC TROMETHAMINE 30 MG/ML IJ SOLN
INTRAMUSCULAR | Status: DC | PRN
Start: 2021-07-26 — End: 2021-07-26
  Administered 2021-07-26: 30 mg via INTRAVENOUS

## 2021-07-26 MED ORDER — FENTANYL CITRATE (PF) 100 MCG/2ML IJ SOLN
INTRAMUSCULAR | Status: DC | PRN
  Administered 2021-07-26 (×2): 25 ug via INTRAVENOUS
  Administered 2021-07-26: 50 ug via INTRAVENOUS

## 2021-07-26 MED ORDER — ACETAMINOPHEN 325 MG PO TABS: 650.0000 mg | ORAL_TABLET | ORAL | Status: DC | PRN

## 2021-07-26 MED ORDER — ONDANSETRON HCL 4 MG/2ML IJ SOLN
INTRAMUSCULAR | Status: AC
  Filled 2021-07-26: qty 2

## 2021-07-26 MED ORDER — MIDAZOLAM HCL 2 MG/2ML IJ SOLN
INTRAMUSCULAR | Status: AC
  Filled 2021-07-26: qty 2

## 2021-07-26 MED ORDER — ROCURONIUM BROMIDE 100 MG/10ML IV SOLN
INTRAVENOUS | Status: DC | PRN
Start: 2021-07-26 — End: 2021-07-26
  Administered 2021-07-26: 10 mg via INTRAVENOUS
  Administered 2021-07-26: 40 mg via INTRAVENOUS
  Administered 2021-07-26: 10 mg via INTRAVENOUS
  Administered 2021-07-26: 5 mg via INTRAVENOUS
  Administered 2021-07-26: 10 mg via INTRAVENOUS

## 2021-07-26 MED ORDER — HYDROMORPHONE HCL 1 MG/ML IJ SOLN
INTRAMUSCULAR | Status: AC
  Administered 2021-07-26: 0.5 mg via INTRAVENOUS
  Filled 2021-07-26: qty 1

## 2021-07-26 MED ORDER — ACETAMINOPHEN 10 MG/ML IV SOLN
INTRAVENOUS | Status: DC | PRN
  Administered 2021-07-26: 1000 mg via INTRAVENOUS

## 2021-07-26 MED ORDER — OXYCODONE HCL 5 MG/5ML PO SOLN: 5.0000 mg | Freq: Once | ORAL | Status: AC | PRN

## 2021-07-26 MED ORDER — PHENYLEPHRINE 40 MCG/ML (10ML) SYRINGE FOR IV PUSH (FOR BLOOD PRESSURE SUPPORT)
PREFILLED_SYRINGE | INTRAVENOUS | Status: DC | PRN
  Administered 2021-07-26: 80 ug via INTRAVENOUS
  Administered 2021-07-26 (×3): 160 ug via INTRAVENOUS
  Administered 2021-07-26: 80 ug via INTRAVENOUS
  Administered 2021-07-26: 160 ug via INTRAVENOUS
  Administered 2021-07-26 (×4): 80 ug via INTRAVENOUS

## 2021-07-26 MED ORDER — FENTANYL CITRATE (PF) 100 MCG/2ML IJ SOLN
25.0000 ug | INTRAMUSCULAR | Status: DC | PRN
  Administered 2021-07-26: 25 ug via INTRAVENOUS
  Administered 2021-07-26: 50 ug via INTRAVENOUS
  Administered 2021-07-26: 25 ug via INTRAVENOUS

## 2021-07-26 MED ORDER — EPHEDRINE 5 MG/ML INJ
INTRAVENOUS | Status: AC
  Filled 2021-07-26: qty 5

## 2021-07-26 MED ORDER — HYDROMORPHONE HCL 1 MG/ML IJ SOLN
INTRAMUSCULAR | Status: DC | PRN
  Administered 2021-07-26: .5 mg via INTRAVENOUS

## 2021-07-26 MED ORDER — FLUORESCEIN SODIUM 10 % IV SOLN
INTRAVENOUS | Status: AC
  Filled 2021-07-26: qty 5

## 2021-07-26 MED ORDER — FAMOTIDINE 20 MG PO TABS
ORAL_TABLET | ORAL | Status: AC
  Administered 2021-07-26: 20 mg via ORAL
  Filled 2021-07-26: qty 1

## 2021-07-26 MED ORDER — CHLORHEXIDINE GLUCONATE 0.12 % MT SOLN
OROMUCOSAL | Status: AC
  Administered 2021-07-26: 15 mL via OROMUCOSAL
  Filled 2021-07-26: qty 15

## 2021-07-26 MED ORDER — SODIUM CHLORIDE 0.9 % IV SOLN
INTRAVENOUS | Status: AC
  Filled 2021-07-26: qty 2

## 2021-07-26 MED ORDER — LIDOCAINE HCL (CARDIAC) PF 100 MG/5ML IV SOSY
PREFILLED_SYRINGE | INTRAVENOUS | Status: DC | PRN
Start: 2021-07-26 — End: 2021-07-26
  Administered 2021-07-26: 80 mg via INTRAVENOUS

## 2021-07-26 MED ORDER — DEXAMETHASONE SODIUM PHOSPHATE 10 MG/ML IJ SOLN
INTRAMUSCULAR | Status: AC
  Filled 2021-07-26: qty 1

## 2021-07-26 MED ORDER — LACTATED RINGERS IV SOLN: INTRAVENOUS | Status: DC

## 2021-07-26 MED ORDER — LACTATED RINGERS IR SOLN
Status: DC | PRN
  Administered 2021-07-26: 300 mL

## 2021-07-26 MED ORDER — PHENYLEPHRINE HCL-NACL 20-0.9 MG/250ML-% IV SOLN
INTRAVENOUS | Status: AC
  Filled 2021-07-26: qty 250

## 2021-07-26 MED ORDER — 0.9 % SODIUM CHLORIDE (POUR BTL) OPTIME
TOPICAL | Status: DC | PRN
  Administered 2021-07-26: 700 mL

## 2021-07-26 MED ORDER — OXYCODONE HCL 5 MG PO TABS
ORAL_TABLET | ORAL | Status: AC
  Administered 2021-07-26: 5 mg via ORAL
  Filled 2021-07-26: qty 1

## 2021-07-26 MED ORDER — PROPOFOL 10 MG/ML IV BOLUS
INTRAVENOUS | Status: DC | PRN
  Administered 2021-07-26: 170 mg via INTRAVENOUS

## 2021-07-26 MED ORDER — ONDANSETRON 4 MG PO TBDP: 4.0000 mg | ORAL_TABLET | Freq: Four times a day (QID) | ORAL | 0 refills | Status: AC | PRN

## 2021-07-26 MED ORDER — PROPOFOL 10 MG/ML IV BOLUS
INTRAVENOUS | Status: AC
  Filled 2021-07-26: qty 20

## 2021-07-26 MED ORDER — OXYCODONE-ACETAMINOPHEN 5-325 MG PO TABS
1.0000 | ORAL_TABLET | ORAL | Status: DC | PRN
Start: 2021-07-26 — End: 2021-07-26

## 2021-07-26 MED ORDER — SUGAMMADEX SODIUM 200 MG/2ML IV SOLN
INTRAVENOUS | Status: DC | PRN
  Administered 2021-07-26 (×2): 100 mg via INTRAVENOUS

## 2021-07-26 MED ORDER — PROPOFOL 500 MG/50ML IV EMUL
INTRAVENOUS | Status: DC | PRN
  Administered 2021-07-26: 25 ug/kg/min via INTRAVENOUS

## 2021-07-26 MED ORDER — FUROSEMIDE 10 MG/ML IJ SOLN
INTRAMUSCULAR | Status: AC
  Filled 2021-07-26: qty 4

## 2021-07-26 MED ORDER — MORPHINE SULFATE (PF) 2 MG/ML IV SOLN
INTRAVENOUS | Status: AC
  Filled 2021-07-26: qty 1

## 2021-07-26 MED ORDER — BUPIVACAINE HCL (PF) 0.5 % IJ SOLN
INTRAMUSCULAR | Status: AC
  Filled 2021-07-26: qty 30

## 2021-07-26 MED ORDER — FENTANYL CITRATE (PF) 100 MCG/2ML IJ SOLN
INTRAMUSCULAR | Status: AC
  Administered 2021-07-26: 25 ug via INTRAVENOUS
  Filled 2021-07-26: qty 2

## 2021-07-26 MED ORDER — DEXAMETHASONE SODIUM PHOSPHATE 10 MG/ML IJ SOLN
INTRAMUSCULAR | Status: DC | PRN
  Administered 2021-07-26: 10 mg via INTRAVENOUS

## 2021-07-26 MED ORDER — EPHEDRINE SULFATE (PRESSORS) 50 MG/ML IJ SOLN
INTRAMUSCULAR | Status: DC | PRN
  Administered 2021-07-26 (×3): 5 mg via INTRAVENOUS
  Administered 2021-07-26: 10 mg via INTRAVENOUS

## 2021-07-26 MED ORDER — BUPIVACAINE HCL (PF) 0.5 % IJ SOLN
INTRAMUSCULAR | Status: DC | PRN
  Administered 2021-07-26: 16 mL

## 2021-07-26 MED ORDER — KETOROLAC TROMETHAMINE 30 MG/ML IJ SOLN
INTRAMUSCULAR | Status: AC
  Filled 2021-07-26: qty 1

## 2021-07-26 MED ORDER — FUROSEMIDE 10 MG/ML IJ SOLN
INTRAMUSCULAR | Status: DC | PRN
  Administered 2021-07-26: 10 mg via INTRAMUSCULAR

## 2021-07-26 MED ORDER — ACETAMINOPHEN 10 MG/ML IV SOLN
INTRAVENOUS | Status: AC
  Filled 2021-07-26: qty 100

## 2021-07-26 MED ORDER — APREPITANT 40 MG PO CAPS
ORAL_CAPSULE | ORAL | Status: AC
  Administered 2021-07-26: 40 mg via ORAL
  Filled 2021-07-26: qty 1

## 2021-07-26 MED ORDER — MIDAZOLAM HCL 2 MG/2ML IJ SOLN
INTRAMUSCULAR | Status: DC | PRN
Start: 2021-07-26 — End: 2021-07-26
  Administered 2021-07-26 (×2): 1 mg via INTRAVENOUS

## 2021-07-26 MED ORDER — OXYCODONE HCL 5 MG PO TABS
10.0000 mg | ORAL_TABLET | ORAL | Status: AC
  Administered 2021-07-26: 10 mg via ORAL

## 2021-07-26 MED ORDER — PROPOFOL 10 MG/ML IV BOLUS
INTRAVENOUS | Status: AC
  Filled 2021-07-26: qty 40

## 2021-07-26 MED ORDER — LIDOCAINE HCL (PF) 2 % IJ SOLN
INTRAMUSCULAR | Status: AC
  Filled 2021-07-26: qty 5

## 2021-07-26 MED ORDER — LORAZEPAM 2 MG/ML IJ SOLN
1.0000 mg | INTRAMUSCULAR | Status: AC
Start: 2021-07-26 — End: 2021-07-26

## 2021-07-26 MED ORDER — FENTANYL CITRATE (PF) 100 MCG/2ML IJ SOLN
INTRAMUSCULAR | Status: AC
  Filled 2021-07-26: qty 2

## 2021-07-26 MED ORDER — OXYCODONE HCL 5 MG PO TABS
ORAL_TABLET | ORAL | Status: AC
  Filled 2021-07-26: qty 2

## 2021-07-26 MED ORDER — LORAZEPAM 2 MG/ML IJ SOLN
INTRAMUSCULAR | Status: AC
  Administered 2021-07-26: 1 mg via INTRAVENOUS
  Filled 2021-07-26: qty 1

## 2021-07-26 MED ORDER — ACETAMINOPHEN 650 MG RE SUPP
650.0000 mg | RECTAL | Status: DC | PRN
  Filled 2021-07-26: qty 1

## 2021-07-26 MED ORDER — HYDROMORPHONE HCL 1 MG/ML IJ SOLN
0.5000 mg | INTRAMUSCULAR | Status: AC | PRN
  Administered 2021-07-26 (×2): 0.5 mg via INTRAVENOUS

## 2021-07-26 MED ORDER — FLUORESCEIN SODIUM 10 % IV SOLN
INTRAVENOUS | Status: DC | PRN
  Administered 2021-07-26: .5 mL via INTRAVENOUS

## 2021-07-26 MED ORDER — HEMOSTATIC AGENTS (NO CHARGE) OPTIME
TOPICAL | Status: DC | PRN
  Administered 2021-07-26: 1 via TOPICAL

## 2021-07-26 MED ORDER — HYDROMORPHONE HCL 1 MG/ML IJ SOLN
INTRAMUSCULAR | Status: AC
  Filled 2021-07-26: qty 1

## 2021-07-26 MED ORDER — OXYCODONE HCL 5 MG PO TABS: 5.0000 mg | ORAL_TABLET | Freq: Once | ORAL | Status: AC | PRN

## 2021-07-26 MED ORDER — ONDANSETRON HCL 4 MG/2ML IJ SOLN
INTRAMUSCULAR | Status: DC | PRN
  Administered 2021-07-26 (×2): 4 mg via INTRAVENOUS

## 2021-07-26 SURGICAL SUPPLY — 51 items
ADH SKN CLS APL DERMABOND .7 (GAUZE/BANDAGES/DRESSINGS) ×2
APL SRG 38 LTWT LNG FL B (MISCELLANEOUS) ×2
APPLICATOR ARISTA FLEXITIP XL (MISCELLANEOUS) ×1 IMPLANT
BACTOSHIELD CHG 4% 4OZ (MISCELLANEOUS) ×1
BAG DRN RND TRDRP ANRFLXCHMBR (UROLOGICAL SUPPLIES) ×2
BAG URINE DRAIN 2000ML AR STRL (UROLOGICAL SUPPLIES) ×3 IMPLANT
BLADE SURG SZ11 CARB STEEL (BLADE) ×3 IMPLANT
CATH FOLEY 2WAY  5CC 16FR (CATHETERS) ×1
CATH FOLEY 2WAY 5CC 16FR (CATHETERS) ×2
CATH URTH 16FR FL 2W BLN LF (CATHETERS) ×2 IMPLANT
DERMABOND ADVANCED (GAUZE/BANDAGES/DRESSINGS) ×1
DERMABOND ADVANCED .7 DNX12 (GAUZE/BANDAGES/DRESSINGS) ×2 IMPLANT
DEVICE SUTURE ENDOST 10MM (ENDOMECHANICALS) ×1 IMPLANT
GAUZE 4X4 16PLY ~~LOC~~+RFID DBL (SPONGE) ×5 IMPLANT
GLOVE SURG ENC MOIS LTX SZ8 (GLOVE) ×9 IMPLANT
GLOVE SURG UNDER LTX SZ8 (GLOVE) ×9 IMPLANT
GOWN STRL REUS W/ TWL LRG LVL3 (GOWN DISPOSABLE) ×8 IMPLANT
GOWN STRL REUS W/ TWL XL LVL3 (GOWN DISPOSABLE) ×6 IMPLANT
GOWN STRL REUS W/TWL LRG LVL3 (GOWN DISPOSABLE) ×9
GOWN STRL REUS W/TWL XL LVL3 (GOWN DISPOSABLE) ×6
GRASPER SUT TROCAR 14GX15 (MISCELLANEOUS) ×3 IMPLANT
HEMOSTAT ARISTA ABSORB 3G PWDR (HEMOSTASIS) ×1 IMPLANT
IRRIGATION STRYKERFLOW (MISCELLANEOUS) IMPLANT
IRRIGATOR STRYKERFLOW (MISCELLANEOUS) ×3
IV LACTATED RINGERS 1000ML (IV SOLUTION) ×6 IMPLANT
KIT PINK PAD W/HEAD ARE REST (MISCELLANEOUS) ×3
KIT PINK PAD W/HEAD ARM REST (MISCELLANEOUS) ×2 IMPLANT
KIT TURNOVER CYSTO (KITS) ×3 IMPLANT
LABEL OR SOLS (LABEL) ×3 IMPLANT
MANIFOLD NEPTUNE II (INSTRUMENTS) ×3 IMPLANT
MANIPULATOR VCARE LG CRV RETR (MISCELLANEOUS) ×1 IMPLANT
NEEDLE VERESS 14GA 120MM (NEEDLE) ×3 IMPLANT
NS IRRIG 500ML POUR BTL (IV SOLUTION) ×3 IMPLANT
OCCLUDER COLPOPNEUMO (BALLOONS) ×3 IMPLANT
PACK GYN LAPAROSCOPIC (MISCELLANEOUS) ×3 IMPLANT
PAD OB MATERNITY 4.3X12.25 (PERSONAL CARE ITEMS) ×3 IMPLANT
PAD PREP 24X41 OB/GYN DISP (PERSONAL CARE ITEMS) ×3 IMPLANT
SCISSORS METZENBAUM CVD 33 (INSTRUMENTS) ×1 IMPLANT
SCRUB CHG 4% DYNA-HEX 4OZ (MISCELLANEOUS) ×2 IMPLANT
SET CYSTO W/LG BORE CLAMP LF (SET/KITS/TRAYS/PACK) ×3 IMPLANT
SET TRI-LUMEN FLTR TB AIRSEAL (TUBING) ×3 IMPLANT
SHEARS HARMONIC ACE PLUS 36CM (ENDOMECHANICALS) ×3 IMPLANT
SLEEVE ENDOPATH XCEL 5M (ENDOMECHANICALS) ×3 IMPLANT
SUT ENDO VLOC 180-0-8IN (SUTURE) ×1 IMPLANT
SUT VIC AB 0 CT1 36 (SUTURE) ×3 IMPLANT
SUT VIC AB 4-0 FS2 27 (SUTURE) ×3 IMPLANT
SYR 10ML LL (SYRINGE) ×3 IMPLANT
SYR 50ML LL SCALE MARK (SYRINGE) ×3 IMPLANT
TROCAR PORT AIRSEAL 8X100 (TROCAR) ×3 IMPLANT
TROCAR XCEL NON-BLD 5MMX100MML (ENDOMECHANICALS) ×3 IMPLANT
WATER STERILE IRR 500ML POUR (IV SOLUTION) ×3 IMPLANT

## 2021-07-26 NOTE — Anesthesia Procedure Notes (Signed)
Procedure Name: Intubation ?Date/Time: 07/26/2021 7:36 AM ?Performed by: Loletha Grayer, CRNA ?Pre-anesthesia Checklist: Patient identified, Patient being monitored, Timeout performed, Emergency Drugs available and Suction available ?Patient Re-evaluated:Patient Re-evaluated prior to induction ?Oxygen Delivery Method: Circle system utilized ?Preoxygenation: Pre-oxygenation with 100% oxygen ?Induction Type: IV induction ?Ventilation: Mask ventilation without difficulty and Oral airway inserted - appropriate to patient size ?Laryngoscope Size: 3 and McGraph ?Grade View: Grade I ?Tube type: Oral ?Tube size: 7.0 mm ?Number of attempts: 1 ?Airway Equipment and Method: Stylet ?Placement Confirmation: ETT inserted through vocal cords under direct vision, positive ETCO2 and breath sounds checked- equal and bilateral ?Secured at: 21 cm ?Tube secured with: Tape ?Dental Injury: Teeth and Oropharynx as per pre-operative assessment  ? ? ? ? ?

## 2021-07-26 NOTE — Anesthesia Preprocedure Evaluation (Signed)
Anesthesia Evaluation  ?Patient identified by MRN, date of birth, ID band ?Patient awake ? ? ? ?Reviewed: ?Allergy & Precautions, NPO status , Patient's Chart, lab work & pertinent test results ? ?History of Anesthesia Complications ?(+) PONV and history of anesthetic complications ? ?Airway ?Mallampati: III ? ?TM Distance: >3 FB ?Neck ROM: full ? ? ? Dental ? ?(+) Chipped, Poor Dentition ?  ?Pulmonary ?neg shortness of breath, former smoker,  ?  ?Pulmonary exam normal ? ? ? ? ? ? ? Cardiovascular ?Exercise Tolerance: Good ?hypertension, + CAD  ?Normal cardiovascular exam ? ? ?  ?Neuro/Psych ? Headaches, PSYCHIATRIC DISORDERS   ? GI/Hepatic ?negative GI ROS, (+) Hepatitis -  ?Endo/Other  ?negative endocrine ROS ? Renal/GU ?  ? ?  ?Musculoskeletal ? ?(+) Arthritis ,  ? Abdominal ?  ?Peds ? Hematology ?negative hematology ROS ?(+)   ?Anesthesia Other Findings ?Past Medical History: ?No date: Anxiety ?No date: Arthritis ?2013: Depression ?10/04/2016: Fibroids, submucosal ?No date: Headache ?06/27/2014: Hep C w/o coma, chronic ([place]) ?    Comment:  had treatment  ?No date: History of kidney stones ?2014: Hx of right and left heart catheterization ?    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    Comment:  after taking tamoxifan-on plavix and asa ?No date: PONV (postoperative nausea and vomiting) ?No date: Restless legs syndrome ? ?Past Surgical History: ?11/20/2016: AXILLARY SENTINEL NODE BIOPSY; Right ?    Comment:  Procedure: AXILLARY SENTINEL NODE BIOPSY;  Surgeon:  ?             Aviva Signs, MD;  Location: [facility];  Service: General;  ?             Laterality: Right;  Sentinel Node Injection @ 10:30am ?2022: BLADDER REPAIR ?12/25/2016: BREAST RECONSTRUCTION WITH PLACEMENT OF TISSUE EXPANDER  ?AND FLEX HD (ACELLULAR HYDRATED DERMIS); Bilateral ?     Comment:  Procedure: BREAST RECONSTRUCTION WITH PLACEMENT OF  ?             TISSUE EXPANDER AND FLEX HD (ACELLULAR HYDRATED DERMIS);  ?             Surgeon: Wallace Going, DO;  Location: [facility]  ?             [facility];  Service: Plastics;  Laterality:  ?             Bilateral; ?No date: CARDIAC CATHETERIZATION ?10/23/2016: [place]  ?ABLATION; N/A ?    Comment:  Procedure: DILATATION & CURETTAGE/HYSTEROSCOPY WITH  ?             NOVASURE ENDOMETRIAL ABLATION;  Surgeon: Florian Buff,  ?             MD;  [facility]: [facility];  Service: Gynecology;  Laterality: ?             N/A; ?No date: KIDNEY CYST REMOVAL ?10/23/2016: MASTECTOMY MODIFIED RADICAL; Right ?    Comment:  Procedure: MASTECTOMY MODIFIED RADICAL;  Surgeon:  ?             Aviva Signs, MD;  [facility];  Service: General;  ?             Laterality: Right; ?No date: ROTATOR CUFF REPAIR ?10/23/2016: SIMPLE MASTECTOMY WITH AXILLARY SENTINEL NODE BIOPSY; Left ?    Comment:  Procedure: SIMPLE MASTECTOMY;  Surgeon: Aviva Signs,  ?             MD;  [facility];  Service: General;  Laterality:  ?             Left; ?02/27/2017: TISSUE EXPANDER PLACEMENT; Bilateral ?    Comment:  Procedure: REMOVAL TISSUE EXPANDER;  Surgeon:  ?             Wallace Going, DO;  Location: [facility];  Service:  ?             Plastics;  Laterality: Bilateral; ?1984: TONSILECTOMY, ADENOIDECTOMY, BILATERAL MYRINGOTOMY AND TUBES;  ?Bilateral ?1998: TUBAL LIGATION ?    Comment:  2000 ? ?BMI   ? Body Mass Index: 34.41 kg/m?  ?  ? ? Reproductive/Obstetrics ?negative OB ROS ? ?  ? ? ? ? ? ? ? ? ? ? ? ? ? ?  ?  ? ? ? ? ? ? ? ? ?Anesthesia Physical ?Anesthesia Plan ? ?ASA: 3 ? ?Anesthesia Plan: General ETT  ? ?Post-op Pain Management:   ? ?Induction: Intravenous ? ?PONV Risk Score and Plan: Ondansetron, Dexamethasone, Midazolam, Treatment may vary due to age or medical condition and Aprepitant ? ?Airway Management Planned:  Oral ETT ? ?Additional Equipment:  ? ?Intra-op Plan:  ? ?Post-operative Plan: Extubation in OR ? ?Informed Consent: I have reviewed the patients History and Physical, chart, labs and discussed the procedure including the risks, benefits and alternatives for the proposed anesthesia with the patient or authorized representative who has indicated his/her understanding and acceptance.  ? ? ? ?Dental Advisory Given ? ?Plan Discussed with: Anesthesiologist, CRNA and Surgeon ? ?Anesthesia Plan Comments: (Patient consented for risks of anesthesia including but not limited to:  ?- adverse reactions to medications ?- damage to eyes, teeth, lips or other oral mucosa ?- nerve damage due to positioning  ?- sore throat or hoarseness ?- Damage to heart, brain, nerves, lungs, other parts of body or loss of life ? ?Patient voiced understanding.)  ? ? ? ? ? ? ?Anesthesia Quick Evaluation ? ?

## 2021-07-26 NOTE — Transfer of Care (Signed)
Immediate Anesthesia Transfer of Care Note ? ?Patient: Susan Benton ? ?Procedure(s) Performed: TOTAL LAPAROSCOPIC HYSTERECTOMY WITH BILATERAL SALPINGO OOPHORECTOMY (Bilateral) ?CYSTOSCOPY ? ?Patient Location: PACU ? ?Anesthesia Type:General ? ?Level of Consciousness: drowsy ? ?Airway & Oxygen Therapy: Patient Spontanous Breathing and Patient connected to face mask oxygen ? ?Post-op Assessment: Report given to RN and Post -op Vital signs reviewed and stable ? ?Post vital signs: Reviewed and stable ? ?Last Vitals:  ?Vitals Value Taken Time  ?BP 102/50 07/26/21 0946  ?Temp    ?Pulse 79 07/26/21 0946  ?Resp 12 07/26/21 0946  ?SpO2 99 % 07/26/21 0946  ?Vitals shown include unvalidated device data. ? ?Last Pain:  ?Vitals:  ? 07/26/21 0941  ?TempSrc:   ?PainSc: Asleep  ?   ? ?Patients Stated Pain Goal: 3 (07/26/21 0029) ? ?Complications: No notable events documented. ?

## 2021-07-26 NOTE — Interval H&P Note (Signed)
History and Physical Interval Note: ? ?07/26/2021 ?6:53 AM ? ?Susan Benton  has presented today for surgery, with the diagnosis of Postmenopausal Bleeding ?Endometrial thickening by ultrasound ?Endometrial polyp ?history of Tamoxifen use.  The various methods of treatment have been discussed with the patient and family. After consideration of risks, benefits and other options for treatment, the patient has consented to  Procedure(s): ?TOTAL LAPAROSCOPIC HYSTERECTOMY WITH BILATERAL SALPINGO OOPHORECTOMY (Bilateral) as a surgical intervention.  The patient's history has been reviewed, patient examined, no change in status, stable for surgery.  I have reviewed the patient's chart and labs.  Questions were answered to the patient's satisfaction.   ? ? ?Hoyt Koch ? ? ?

## 2021-07-26 NOTE — Discharge Instructions (Signed)
AMBULATORY SURGERY  ?DISCHARGE INSTRUCTIONS ? ? ?The drugs that you were given will stay in your system until tomorrow so for the next 24 hours you should not: ? ?Drive an automobile ?Make any legal decisions ?Drink any alcoholic beverage ? ? ?You may resume regular meals tomorrow.  Today it is better to start with liquids and gradually work up to solid foods. ? ?You may eat anything you prefer, but it is better to start with liquids, then soup and crackers, and gradually work up to solid foods. ? ? ?Please notify your doctor immediately if you have any unusual bleeding, trouble breathing, redness and pain at the surgery site, drainage, fever, or pain not relieved by medication. ? ? ? ?Additional Instructions: ? ? ? ?Please contact your physician with any problems or Same Day Surgery at [phone], Monday through Friday 6 am to 4 pm, or [facility] at [place] Main number at [phone].  ?

## 2021-07-26 NOTE — Anesthesia Postprocedure Evaluation (Signed)
Anesthesia Post Note ? ?Patient: Susan Benton ? ?Procedure(s) Performed: TOTAL LAPAROSCOPIC HYSTERECTOMY WITH BILATERAL SALPINGO OOPHORECTOMY (Bilateral) ?CYSTOSCOPY ? ?Patient location during evaluation: PACU ?Anesthesia Type: General ?Level of consciousness: awake and alert ?Pain management: pain level controlled ?Vital Signs Assessment: post-procedure vital signs reviewed and stable ?Respiratory status: spontaneous breathing, nonlabored ventilation, respiratory function stable and patient connected to nasal cannula oxygen ?Cardiovascular status: blood pressure returned to baseline and stable ?Postop Assessment: no apparent nausea or vomiting ?Anesthetic complications: no ? ? ?No notable events documented. ? ? ?Last Vitals:  ?Vitals:  ? 07/26/21 1215 07/26/21 1230  ?BP: 135/76 130/87  ?Pulse: 82 94  ?Resp: 11 20  ?Temp: (!) 36.3 ?C (!) 36.3 ?C  ?SpO2: 92% 95%  ?  ?Last Pain:  ?Vitals:  ? 07/26/21 1230  ?TempSrc:   ?PainSc: 6   ? ? ?  ?  ?  ?  ?  ?  ? ?Susan Benton ? ? ? ? ?

## 2021-07-26 NOTE — Op Note (Signed)
Operative Report: ? ?PRE-OP DIAGNOSIS: Postmenopausal Bleeding ?Endometrial thickening by ultrasound ?Endometrial polyp ?Breast cancer ?History of Tamoxifen use  ? ?POST-OP DIAGNOSIS: Postmenopausal Bleeding ?Endometrial thickening by ultrasound ?Endometrial polyp ?Breast Cancer ?History of Tamoxifen use  ? ?PROCEDURE: Procedure(s): ?TOTAL LAPAROSCOPIC HYSTERECTOMY WITH BILATERAL SALPINGO OOPHORECTOMY ?CYSTOSCOPY ? ?SURGEON: Barnett Applebaum, MD, FACOG ? ?ASSISTANT: Lawernce Pitts, RNFA  ? ?ANESTHESIA: General endotracheal anesthesia ? ?ESTIMATED BLOOD LOSS: 20 mL ? ?SPECIMENS: Uterus, Tubes, Ovaries. ? ?COMPLICATIONS: None ? ?DISPOSITION: stable to PACU ? ?FINDINGS: Intraabdominal adhesions were not noted.  ? ?PROCEDURE: ? ?The patient was taken to the OR where anesthesia was administed. She was prepped and draped in the normal sterile fashion in the dorsal lithotomy position in the [place] stirrups. A time out was performed. A Graves speculum was inserted, the cervix was grasped with a single tooth tenaculum and the endometrial cavity was sounded. The cervix was progressively dilated to a size 18 [place] with [employer] dilators. A V-Care uterine manipulator was inserted in the usual fashion without incident. Gloves were changed and attention was turned to the abdomen.  ? ?An infraumbilical transverse 48mm skin incision was made with the scalpel after local anesthesia applied to the skin. A Veress-step needle was inserted in the usual fashion and confirmed using the hanging drop technique. A pneumoperitoneum was obtained by insufflation of CO2 (opening pressure of 50mmHg) to 55mmHg. An 8 mm trocar is then placed under direct visualization with the laparoscope. A diagnostic laparoscopy was performed yielding the previously described findings. Attention was turned to the left lower quadrant where after visualization of the inferior epigastric vessels a 85mm skin incision was made with the scalpel. A 5 mm laparoscopic port was  inserted. The same procedure was repeated in the right lower quadrant with a 66mm trocar. Attention was turned to the left aspect of the uterus, where after visualization of the ureter, the round ligament was coagulated and transected using the 79mm Harmonic Scapel. The anterior and posterior leafs of the broad ligament were dissected off as the anterior one was coagulated and transected in a caudal direction towards the cuff of the uterine manipulator.  Attention was then turned to the left fallopian tube and ovary which was recognized by visualization of the fimbria. The infundibulopelvic ligament and its blood vessels were carefully coagulated and transected using the Harmonic scapel.  Attention was turned to the right aspect of the uterus where the same procedure was performed.  The vesicouterine reflection of the peritoneum was dissected with the harmonic scapel and the bladder flap was created bluntly.  The uterine vessels were coagulated and transected bilaterally using first bipolar cautery and then the harmonic scapel. A 360 degree, circumferential colpotomy was done to completely amputate the uterus with cervix and tubes. Once the specimen was amputated it was delivered through the vagina.  ? ?The colpotomy was repaired in a simple running fashion using a delayed absorbable suture with an endo-stitch device.  Vaginal exam confirms complete closure.  The cavity was copiously irrigated. A survey of the pelvic cavity revealed adequate hemostasis and no injury to bowel, bladder, or ureter.  The umbilical incision is closed (fascia) with a 0 Vicryl suture using a fascia closure device. ?  ?A diagnostic cystoscopy was performed using saline distension of bladder with no lesions or injuries noted.  Bilateral urine flow from each ureteral orifice is visualized. ? ?At this point the procedure was finalized. All the instruments were removed from the patient's body. Gas was expelled and patient  is leveled.  Incisions  are closed with skin adhesive.   ? ?Patient goes to recovery room in stable condition.  All sponge, instrument, and needle counts are correct x2.   ? ? ?Barnett Applebaum, MD, FACOG ?[facility], [place] ?07/26/2021  9:36 AM ? ?

## 2021-07-27 ENCOUNTER — Telehealth: Payer: Self-pay

## 2021-07-27 ENCOUNTER — Encounter: Payer: Self-pay | Admitting: Obstetrics & Gynecology

## 2021-07-27 NOTE — Telephone Encounter (Signed)
Pt calling; had hyst yesterday; pain is still a 10/10; her PCP has her taking oxy 15mg  TID and has been doing this for years; anesthesia said it may not help since she has been on it for so long; PCP can order extra if there is a diagnosis of the surgery on the order; pt is asking for PH to call Dr. Wende Neighbors in [place] on [address] and discuss something to get her through the weekend. [phone] ?

## 2021-07-27 NOTE — Telephone Encounter (Signed)
Called and left a message for Dr. Juel Burrow nurse ?

## 2021-07-27 NOTE — Telephone Encounter (Signed)
Pt calling; apologized - she is not thinking; forgot today is Friday; Dr. Juel Burrow office is closed; states to scratch prev msg and she will call Dr. Nevada Crane on Monday; will try taking IBU over weekend and see how that does. ?

## 2021-07-30 LAB — SURGICAL PATHOLOGY

## 2021-08-10 ENCOUNTER — Encounter: Payer: Self-pay | Admitting: Obstetrics & Gynecology

## 2021-08-10 ENCOUNTER — Other Ambulatory Visit: Payer: Self-pay

## 2021-08-10 ENCOUNTER — Ambulatory Visit (INDEPENDENT_AMBULATORY_CARE_PROVIDER_SITE_OTHER): Payer: Managed Care, Other (non HMO) | Admitting: Obstetrics & Gynecology

## 2021-08-10 VITALS — BP 140/90 | Ht 69.0 in | Wt 232.0 lb

## 2021-08-10 DIAGNOSIS — R8290 Unspecified abnormal findings in urine: Secondary | ICD-10-CM

## 2021-08-10 DIAGNOSIS — N950 Postmenopausal bleeding: Secondary | ICD-10-CM

## 2021-08-10 DIAGNOSIS — Z9229 Personal history of other drug therapy: Secondary | ICD-10-CM

## 2021-08-10 DIAGNOSIS — Z90710 Acquired absence of both cervix and uterus: Secondary | ICD-10-CM

## 2021-08-10 LAB — POCT URINALYSIS DIPSTICK
Bilirubin, UA: NEGATIVE
Glucose, UA: NEGATIVE
Ketones, UA: NEGATIVE
Nitrite, UA: NEGATIVE
Protein, UA: NEGATIVE
Spec Grav, UA: 1.01 (ref 1.010–1.025)
Urobilinogen, UA: 0.2 E.U./dL
pH, UA: 5 (ref 5.0–8.0)

## 2021-08-10 MED ORDER — CLONIDINE HCL 0.1 MG PO TABS: 0.1000 mg | ORAL_TABLET | Freq: Every day | ORAL | 11 refills | Status: DC

## 2021-08-10 NOTE — Patient Instructions (Addendum)
This is the new practice information you requested: ? ?Dr. [facility] ?[address] ?[address] ?[place], [place]  [address] ?[phone] ? ? ?Clonidine Tablets ?What is this medication? ?CLONIDINE (KLOE ni deen) treats high blood pressure. It works by relaxing blood vessels, which decreases blood pressure and the amount of work the heart has to do. ?This medicine may be used for other purposes; ask your health care provider or pharmacist if you have questions. ?COMMON BRAND NAME(S): Catapres ?What should I tell my care team before I take this medication? ?They need to know if you have any of these conditions: ?Kidney disease ?An unusual or allergic reaction to clonidine, other medications, foods, dyes, or preservatives ?Pregnant or trying to get pregnant ?Breast-feeding ?How should I use this medication? ?Take this medication by mouth. Take it as directed on the prescription label at the same time every day. You can take it with or without food. If it upsets your stomach, take it with food. Keep taking it unless your care team tells you to stop. ?Talk to your care team about the use of this drug in children. Special care may be needed. ?Overdosage: If you think you have taken too much of this medicine contact a poison control center or emergency room at once. ?NOTE: This medicine is only for you. Do not share this medicine with others. ?What if I miss a dose? ?If you miss a dose, take it as soon as you can. If it is almost time for your next dose, take only that dose. Do not take double or extra doses. ?What may interact with this medication? ?Do not take this medication with any of the following: ?MAOIs like Carbex, Eldepryl, Marplan, Nardil, and Parnate ?This medication may also interact with the following: ?Barbiturate medications for inducing sleep or treating seizures like phenobarbital ?Certain medications for blood pressure, heart disease, irregular heart beat ?Certain  medications for depression, anxiety, or psychotic disturbances ?Prescription pain medications ?This list may not describe all possible interactions. Give your health care provider a list of all the medicines, herbs, non-prescription drugs, or dietary supplements you use. Also tell them if you smoke, drink alcohol, or use illegal drugs. Some items may interact with your medicine. ?What should I watch for while using this medication? ?Visit your care team for regular checks on your progress. Check your heart rate and blood pressure regularly while you are taking this medication. Ask your care team what your heart rate should be and when you should contact them. ?You may get drowsy or dizzy. Do not drive, use machinery, or do anything that needs mental alertness until you know how this medication affects you. To avoid dizzy or fainting spells, do not stand or sit up quickly, especially if you are an older person. Alcohol can make you more drowsy and dizzy. Avoid alcoholic drinks. ?Your mouth may get dry. Chewing sugarless gum or sucking hard candy, and drinking plenty of water will help. ?Do not treat yourself for coughs, colds, or pain while you are taking this medication without asking your care team for advice. Some ingredients may increase your blood pressure. ?If you are going to have surgery tell your care team that you are taking this medication. ?What side effects may I notice from receiving this medication? ?Side effects that you should report to your care team as soon as possible: ?Allergic reactions--skin rash, itching, hives, swelling of the face, lips, tongue, or throat ?Low blood pressure--dizziness, feeling faint  or lightheaded, blurry vision ?Slow heartbeat--dizziness, feeling faint or lightheaded, confusion, trouble breathing, unusual weakness or fatigue ?Side effects that usually do not require medical attention (report to your care team if they continue or are  bothersome): ?Constipation ?Dizziness ?Drowsiness ?Dry eyes ?Dry mouth ?Fatigue ?This list may not describe all possible side effects. Call your doctor for medical advice about side effects. You may report side effects to FDA at 1-800-FDA-1088. ?Where should I keep my medication? ?Keep out of the reach of children and pets. ?Store at room temperature between 15 and 30 degrees C (59 and 86 degrees F). Throw away any unused medication after the expiration date. ?NOTE: This sheet is a summary. It may not cover all possible information. If you have questions about this medicine, talk to your doctor, pharmacist, or health care provider. ?? 2022 Elsevier/Gold Standard (2021-01-30 00:00:00) ? ? ?

## 2021-08-10 NOTE — Progress Notes (Signed)
?  Postoperative Follow-up ?Patient presents post op from  [facility] BSO  for abnormal uterine bleeding and Tamoxifen use with risk for cancer , 2 weeks ago. ? ?Subjective: ?Patient reports marked improvement in her preop symptoms. Eating a regular diet without difficulty. The patient is not having any pain.  Activity: normal activities of daily living. Patient reports additional symptom's since surgery of None. ? ?Objective: ?BP 140/90   Ht '5\' 9"'$  (1.753 m)   Wt 232 lb (105.2 kg)   BMI 34.26 kg/m?  ?Physical Exam ?Constitutional:   ?   General: She is not in acute distress. ?   Appearance: She is well-developed.  ?Cardiovascular:  ?   Rate and Rhythm: Normal rate.  ?Pulmonary:  ?   Effort: Pulmonary effort is normal.  ?Abdominal:  ?   General: There is no distension.  ?   Palpations: Abdomen is soft.  ?   Tenderness: There is no abdominal tenderness.  ?   Comments: Incision Healing Well ?  ?Musculoskeletal:     ?   General: Normal range of motion.  ?Neurological:  ?   Mental Status: She is alert and oriented to person, place, and time.  ?   Cranial Nerves: No cranial nerve deficit.  ?Skin: ?   General: Skin is warm and dry.  ? ? ?Assessment: ?s/p :   [facility] BSO  stable ? ?Plan: ?Patient has done well after surgery with no apparent complications.  I have discussed the post-operative course to date, and the expected progress moving forward.  The patient understands what complications to be concerned about.  I will see the patient in routine follow up, or sooner if needed.   ? ?Activity plan: No restriction except Pelvic rest. ?Clonidine for hot flashes ?Urine culture.  She has longstanding history of kidney stones and some infection. ? ?Hoyt Koch ?08/10/2021, 10:22 AM ? ? ?

## 2021-08-14 ENCOUNTER — Other Ambulatory Visit: Payer: Self-pay | Admitting: Obstetrics & Gynecology

## 2021-08-14 ENCOUNTER — Telehealth: Payer: Self-pay

## 2021-08-14 MED ORDER — SULFAMETHOXAZOLE-TRIMETHOPRIM 800-160 MG PO TABS: 1.0000 | ORAL_TABLET | Freq: Two times a day (BID) | ORAL | 0 refills | Status: AC

## 2021-08-14 NOTE — Telephone Encounter (Signed)
Yes, Rx called in

## 2021-08-14 NOTE — Telephone Encounter (Signed)
Left detailed msg that rx has been called in for her; hope you feel better. ?

## 2021-08-14 NOTE — Telephone Encounter (Signed)
Pt calling; was seen last Fri; u/s done showed RBC and WBC; was sent for culture; knows she has a UTI; wants an antibx; urgency is getting worse.  [phone] ?

## 2021-08-16 ENCOUNTER — Other Ambulatory Visit: Payer: Self-pay | Admitting: Obstetrics & Gynecology

## 2021-08-16 LAB — URINE CULTURE

## 2021-08-16 MED ORDER — CIPROFLOXACIN HCL 500 MG PO TABS: 500.0000 mg | ORAL_TABLET | Freq: Two times a day (BID) | ORAL | 0 refills | Status: DC

## 2021-09-11 ENCOUNTER — Encounter: Payer: Managed Care, Other (non HMO) | Admitting: Obstetrics & Gynecology

## 2021-09-25 ENCOUNTER — Ambulatory Visit (INDEPENDENT_AMBULATORY_CARE_PROVIDER_SITE_OTHER): Payer: Managed Care, Other (non HMO) | Admitting: Family Medicine

## 2021-09-25 ENCOUNTER — Encounter: Payer: Self-pay | Admitting: Family Medicine

## 2021-09-25 VITALS — Ht 69.0 in | Wt 228.0 lb

## 2021-09-25 DIAGNOSIS — Z90710 Acquired absence of both cervix and uterus: Secondary | ICD-10-CM

## 2021-09-25 NOTE — Progress Notes (Signed)
? ? ?  GYNECOLOGY PROBLEM  VISIT ENCOUNTER NOTE ?I connected with Susan Benton 09/25/21 at  1:55 PM EDT by: telephone and verified that I am speaking with the correct person using two identifiers.  Patient is located at home and provider is located at [facility].   ?  ?The purpose of this virtual visit is to provide medical care while limiting exposure to the novel coronavirus. I discussed the limitations, risks, security and privacy concerns of performing an evaluation and management service by MyChart video and the availability of in person appointments. I also discussed with the patient that there may be a patient responsible charge related to this service. By engaging in this virtual visit, you consent to the provision of healthcare.  Additionally, you authorize for your insurance to be billed for the services provided during this visit.  The patient expressed understanding and agreed to proceed. ? ?The following staff members participated in the virtual visit:  Quintella Baton ? ?Subjective:  ? Susan Benton is a 51 y.o. G2P2 female here for a problem GYN visit.  Current complaints: Post-Op visit.    ? ?Denies bleeding, pain cramp, reports incisions are WNL/not red. She is feeling sick and this is why we converted to a telephone visit. She reports it is all upper respiratory ? ?On 07/26/21 with Dr. Barnett Applebaum-- PROCEDURE: Procedure(s): ?TOTAL LAPAROSCOPIC HYSTERECTOMY WITH BILATERAL SALPINGO OOPHORECTOMY ?CYSTOSCOPY ? ?4.5 years since having intercourse. Reports having recent sex on Sunday 4/30 ? ?Denies abnormal vaginal bleeding, discharge, pelvic pain, problems with intercourse or other gynecologic concerns.  ?  ?Gynecologic History ?Patient's last menstrual period was 01/21/2017. ? ?Contraception: none ? ?Health Maintenance Due  ?Topic Date Due  ? Zoster Vaccines- Shingrix (1 of 2) Never done  ? COVID-19 Vaccine (3 - Pfizer risk series) 07/20/2020  ? ? ?The following portions of the patient's history were  reviewed and updated as appropriate: allergies, current medications, past family history, past medical history, past social history, past surgical history and problem list. ? ?Review of Systems ?Pertinent items are noted in HPI. ?  ?Objective:  ?Ht '5\' 9"'$  (1.753 m)   Wt 228 lb (103.4 kg)   LMP 01/21/2017   BMI 33.67 kg/m?  ? ?Performed via telehealth ? ?Assessment and Plan:  ? ?1. S/P hysterectomy ?Doing well, no concerns ?Low threshold to return to office ?  ?Please refer to After Visit Summary for other counseling recommendations.  ? ?Return if symptoms worsen or fail to improve. ? ?Caren Macadam, MD, MPH, ABFM ?Attending Physician ?[place] [facility] [place] ? ?

## 2021-10-18 ENCOUNTER — Encounter: Payer: Self-pay | Admitting: Hematology

## 2021-10-19 ENCOUNTER — Other Ambulatory Visit: Payer: Self-pay

## 2021-10-23 ENCOUNTER — Other Ambulatory Visit: Payer: Self-pay

## 2021-10-23 NOTE — Progress Notes (Signed)
Sent fax request to Dr. Alona Bene -Urologist office at [facility] for a copy of the pt's recent CT Scan results and images.

## 2021-10-25 ENCOUNTER — Other Ambulatory Visit: Payer: Self-pay

## 2021-10-25 ENCOUNTER — Encounter: Payer: Self-pay | Admitting: Hematology

## 2021-10-25 DIAGNOSIS — C50411 Malignant neoplasm of upper-outer quadrant of right female breast: Secondary | ICD-10-CM

## 2021-10-25 NOTE — Progress Notes (Signed)
Spoke with Cira Rue, NP regarding pt's recent abdominal x-ray results done at [facility] ordered by Dr. Amalia Hailey.  Lacie gave verbal order for CT Scan of abdomen, pelvis, and chest w/contrast.  Pt's recent abdominal xray shows a liver mass that the pt is highly concerned about.

## 2021-10-30 ENCOUNTER — Ambulatory Visit (HOSPITAL_COMMUNITY)
Admission: RE | Admit: 2021-10-30 | Discharge: 2021-10-30 | Disposition: A | Discharge status: Home / Self Care | Payer: Managed Care, Other (non HMO) | Source: Ambulatory Visit | Attending: Nurse Practitioner | Admitting: Nurse Practitioner

## 2021-10-30 ENCOUNTER — Other Ambulatory Visit: Payer: Self-pay

## 2021-10-30 ENCOUNTER — Inpatient Hospital Stay: Payer: Managed Care, Other (non HMO) | Attending: Physician Assistant

## 2021-10-30 ENCOUNTER — Inpatient Hospital Stay (HOSPITAL_BASED_OUTPATIENT_CLINIC_OR_DEPARTMENT_OTHER): Payer: Managed Care, Other (non HMO) | Admitting: Physician Assistant

## 2021-10-30 VITALS — BP 153/91 | HR 85 | Temp 98.2°F | Resp 17 | Wt 215.2 lb

## 2021-10-30 DIAGNOSIS — C50411 Malignant neoplasm of upper-outer quadrant of right female breast: Secondary | ICD-10-CM | POA: Insufficient documentation

## 2021-10-30 DIAGNOSIS — K769 Liver disease, unspecified: Secondary | ICD-10-CM

## 2021-10-30 DIAGNOSIS — Z9013 Acquired absence of bilateral breasts and nipples: Secondary | ICD-10-CM | POA: Insufficient documentation

## 2021-10-30 DIAGNOSIS — Z170 Estrogen receptor positive status [ER+]: Secondary | ICD-10-CM

## 2021-10-30 DIAGNOSIS — Z79810 Long term (current) use of selective estrogen receptor modulators (SERMs): Secondary | ICD-10-CM | POA: Insufficient documentation

## 2021-10-30 LAB — COMPREHENSIVE METABOLIC PANEL
ALT: 45 U/L — ABNORMAL HIGH (ref 0–44)
AST: 30 U/L (ref 15–41)
Albumin: 4.1 g/dL (ref 3.5–5.0)
Alkaline Phosphatase: 58 U/L (ref 38–126)
Anion gap: 7 (ref 5–15)
BUN: 17 mg/dL (ref 6–20)
CO2: 25 mmol/L (ref 22–32)
Calcium: 9.3 mg/dL (ref 8.9–10.3)
Chloride: 105 mmol/L (ref 98–111)
Creatinine, Ser: 0.8 mg/dL (ref 0.44–1.00)
GFR, Estimated: >60 mL/min (ref >60)
Glucose, Bld: 146 mg/dL — ABNORMAL HIGH (ref 70–99)
Potassium: 3.9 mmol/L (ref 3.5–5.1)
Sodium: 137 mmol/L (ref 135–145)
Total Bilirubin: 0.3 mg/dL (ref 0.3–1.2)
Total Protein: 6.8 g/dL (ref 6.5–8.1)

## 2021-10-30 LAB — CBC WITH DIFFERENTIAL/PLATELET
Abs Immature Granulocytes: 0.03 10*3/uL (ref 0.00–0.07)
Basophils Absolute: 0.1 10*3/uL (ref 0.0–0.1)
Basophils Relative: 1 %
Eosinophils Absolute: 0.2 10*3/uL (ref 0.0–0.5)
Eosinophils Relative: 2 %
HCT: 35 % — ABNORMAL LOW (ref 36.0–46.0)
Hemoglobin: 12 g/dL (ref 12.0–15.0)
Immature Granulocytes: 0 %
Lymphocytes Relative: 34 %
Lymphs Abs: 3.5 10*3/uL (ref 0.7–4.0)
MCH: 28.2 pg (ref 26.0–34.0)
MCHC: 34.3 g/dL (ref 30.0–36.0)
MCV: 82.2 fL (ref 80.0–100.0)
Monocytes Absolute: 0.5 10*3/uL (ref 0.1–1.0)
Monocytes Relative: 5 %
Neutro Abs: 6 10*3/uL (ref 1.7–7.7)
Neutrophils Relative %: 58 %
Platelets: 314 10*3/uL (ref 150–400)
RBC: 4.26 MIL/uL (ref 3.87–5.11)
RDW: 13.2 % (ref 11.5–15.5)
WBC: 10.4 10*3/uL (ref 4.0–10.5)
nRBC: 0 % (ref 0.0–0.2)

## 2021-10-30 MED ORDER — IOHEXOL 300 MG/ML  SOLN
100.0000 mL | Freq: Once | INTRAMUSCULAR | Status: AC | PRN
  Administered 2021-10-30: 100 mL via INTRAVENOUS

## 2021-10-30 NOTE — Progress Notes (Signed)
[place]   [phone] [phone]   Clinic Follow up Note   Patient Care Team: Celene Squibb, MD as PCP - General (Internal Medicine) Domingo Pulse, MD as Consulting Physician (Urology) Truitt Merle, MD as Consulting Physician (Hematology)  Date of Service:  10/30/2021  CHIEF COMPLAINT: f/u of right breast cancer  CURRENT THERAPY:  Tamoxifen, started 02/2017  SUMMARY OF ONCOLOGIC HISTORY: Oncology History  Malignant neoplasm of upper-outer quadrant of right female breast [facility])   Initial Diagnosis   Malignant neoplasm of upper-outer quadrant of right female breast ([facility] [place])    10/08/2016 Initial Biopsy   (R) breast needle biopsy (10:00): Invasive ductal carcinoma, grade 1-2, with DCIS.  ER+ (100%), PR+ (100%), Ki67 10%, HER2 neg (ratio 1.03).     10/23/2016 Surgery   Bilateral mastectomies Arnoldo Morale)    10/23/2016 Pathology Results   RIGHT mastectomy: Multifocal IDC, grade 1, spanning 4.6 cm & 0.6 cm. Low grade DCIS. Negative margins. HER2 repeated and neg (ratio 1.36).  LEFT mastectomy: Fibroadenoma, no malignancy.     11/20/2016 Surgery   (R) axillary sentinel lymph node biopsy Arnoldo Morale)    11/20/2016 Pathology Results   0/1 axillary sentinel LN biopsy for carcinoma.     11/28/2016 Oncotype testing   Recurrence score: 9 (low risk); associated with 6% risk of distant recurrence.     12/03/2016 Imaging   CT chest: IMPRESSION: 1. 7.7 cm lobulated fluid attenuation collection in Susan right axilla, could represent a postoperative fluid collection, with or without presence of infection. Further management will need to be based on clinical grounds. Could correlate with targeted sonography and aspiration as indicated. 2. Clear lung fields 3. Hepatic steatosis 4. Aortic Atherosclerosis    12/13/2016 Miscellaneous   BCI genomic testing revealed LOW risk of recurrence with 4.3% risk and LOW likelihood of benefit of extension of therapy beyond 5  years.         INTERVAL HISTORY:  Susan Benton is here for a follow up of breast cancer. She was last seen by Dr. Burr Medico on 06/04/2021.She presents to Susan clinic alone. Since Susan last visit, Susan Benton underwent hysterectomy with bilateral salpingo-oophorectomy on 07/26/2021, pathology was negative for malignancy. She continues on tamoxifen therapy.   She reports that her energy levels are fairly stable. She does have diffuse joint pain and muscle aches that she feels is secondary to tamoxifen. A few months ago, she started to take tamoxifen every other day and felt better on Susan days off. She is back to taking tamoxifen daily. She reports early satiety and some indigestion. Her weight oscillates over Susan last few months. She denies nausea, vomiting or abdominal pain. Her bowel habits are unchanged. She denies easy bruising or signs of active bleeding. She continues to have hot flashes and sweats with tamoxifen. She denies fevers, chills, shortness of breath, chest pain or cough. She has no other complaints.    All other systems were reviewed with Susan patient and are negative.  MEDICAL HISTORY:  Past Medical History:  Diagnosis Date   Anxiety    Arthritis    Depression 2013   Fibroids, submucosal 10/04/2016   Headache    Hep C w/o coma, chronic ([place]) 06/27/2014   had treatment    History of kidney stones    Hx of right and left heart catheterization 2014   had cath in [place], no intervention   Hypertension 1987   Invasive ductal carcinoma of breast ([place]) 10/14/2016   Low iron  Mesenteric thrombosis (HCC)    after taking tamoxifan-on plavix and asa   PONV (postoperative nausea and vomiting)    Restless legs syndrome     SURGICAL HISTORY: Past Surgical History:  Procedure Laterality Date   AXILLARY SENTINEL NODE BIOPSY Right 11/20/2016   Procedure: AXILLARY SENTINEL NODE BIOPSY;  Surgeon: Aviva Signs, MD;  [facility]: [facility];  Service: General;  Laterality: Right;  Sentinel  Node Injection @ 10:30am   BLADDER REPAIR  2022   BREAST RECONSTRUCTION WITH PLACEMENT OF TISSUE EXPANDER AND FLEX HD (ACELLULAR HYDRATED DERMIS) Bilateral 12/25/2016   Procedure: BREAST RECONSTRUCTION WITH PLACEMENT OF TISSUE EXPANDER AND FLEX HD (ACELLULAR HYDRATED DERMIS);  Surgeon: Wallace Going, DO;  Location: [place];  Service: Plastics;  Laterality: Bilateral;   CARDIAC CATHETERIZATION     CYSTOSCOPY  07/26/2021   Procedure: CYSTOSCOPY;  Surgeon: Gae Dry, MD;  Location: [facility];  Service: Gynecology;;   [place] N/A 10/23/2016   Procedure: DILATATION & CURETTAGE/HYSTEROSCOPY WITH NOVASURE ENDOMETRIAL ABLATION;  Surgeon: Florian Buff, MD;  Location: [facility];  Service: Gynecology;  Laterality: N/A;   KIDNEY CYST REMOVAL     MASTECTOMY MODIFIED RADICAL Right 10/23/2016   Procedure: MASTECTOMY MODIFIED RADICAL;  Surgeon: Aviva Signs, MD;  [facility]: [facility];  Service: General;  Laterality: Right;   ROTATOR CUFF REPAIR     SIMPLE MASTECTOMY WITH AXILLARY SENTINEL NODE BIOPSY Left 10/23/2016   Procedure: SIMPLE MASTECTOMY;  Surgeon: Aviva Signs, MD;  [facility]: [facility];  Service: General;  Laterality: Left;   TISSUE EXPANDER PLACEMENT Bilateral 02/27/2017   Procedure: REMOVAL TISSUE EXPANDER;  Surgeon: Wallace Going, DO;  Location: [place];  Service: Plastics;  Laterality: Bilateral;   TONSILECTOMY, ADENOIDECTOMY, BILATERAL MYRINGOTOMY AND TUBES Bilateral 1984   TOTAL LAPAROSCOPIC HYSTERECTOMY WITH BILATERAL SALPINGO OOPHORECTOMY Bilateral 07/26/2021   Procedure: TOTAL LAPAROSCOPIC HYSTERECTOMY WITH BILATERAL SALPINGO OOPHORECTOMY;  Surgeon: Gae Dry, MD;  Location: [facility];  Service: Gynecology;  Laterality: Bilateral;   TUBAL LIGATION  1998   2000    I have reviewed Susan social history and family history with Susan patient and they are unchanged from previous note.  ALLERGIES:  is allergic to  tramadol.  MEDICATIONS:  Current Outpatient Medications  Medication Sig Dispense Refill   ALPRAZolam (XANAX) 0.5 MG tablet TAKE 1 TABLET BY MOUTH TWICE A DAY AS NEEDED FOR ANXIETY (Patient taking differently: Take 0.5 mg by mouth 2 (two) times daily.) 30 tablet 0   aspirin 81 MG EC tablet Take 81 mg by mouth daily.     atenolol (TENORMIN) 100 MG tablet Take 1 tablet (100 mg total) by mouth daily. (Patient taking differently: Take 100 mg by mouth at bedtime.) 30 tablet 1   ciprofloxacin (CIPRO) 500 MG tablet Take 1 tablet (500 mg total) by mouth 2 (two) times daily. 14 tablet 0   cloNIDine (CATAPRES) 0.1 MG tablet Take 1 tablet (0.1 mg total) by mouth daily. 30 tablet 11   clopidogrel (PLAVIX) 75 MG tablet Take 75 mg by mouth daily.     gabapentin (NEURONTIN) 300 MG capsule TAKE ONE CAPSULE BY MOUTH 3 TIMES A DAY (Patient taking differently: Take 300 mg by mouth daily as needed (Nerve pain).) 90 capsule 2   methocarbamol (ROBAXIN) 500 MG tablet Take 1 tablet (500 mg total) by mouth at bedtime as needed for muscle spasms. 10 tablet 0   olmesartan (BENICAR) 40 MG tablet Take 40 mg by mouth daily.  ondansetron (ZOFRAN-ODT) 4 MG disintegrating tablet Take 1 tablet (4 mg total) by mouth every 6 (six) hours as needed for nausea. 20 tablet 0   rOPINIRole (REQUIP) 2 MG tablet 1-2 tabs po qhs (Patient taking differently: Take 2 mg by mouth daily as needed (Restless leg at bedtime).) 60 tablet 3   solifenacin (VESICARE) 10 MG tablet Take 10 mg by mouth every morning.     tamoxifen (NOLVADEX) 20 MG tablet TAKE 1 TABLET BY MOUTH EVERYDAY AT BEDTIME (Patient taking differently: Take 20 mg by mouth at bedtime.) 90 tablet 0   No current facility-administered medications for this visit.    PHYSICAL EXAMINATION: ECOG PERFORMANCE STATUS: 0 - Asymptomatic  Vitals:   10/30/21 1428  BP: (!) 153/91  Pulse: 85  Resp: 17  Temp: 98.2 F (36.8 C)  SpO2: 97%   Wt Readings from Last 3 Encounters:  10/30/21  215 lb 3.2 oz (97.6 kg)  09/25/21 228 lb (103.4 kg)  08/10/21 232 lb (105.2 kg)     GENERAL:alert, no distress and comfortable SKIN: skin color, texture, turgor are normal, no rashes or significant lesions EYES: normal, Conjunctiva are pink and non-injected, sclera clear  LUNGS: clear to auscultation and percussion with normal breathing effort HEART: regular rate & rhythm and no murmurs and no lower extremity edema Musculoskeletal:no cyanosis of digits and no clubbing  NEURO: alert & oriented x 3 with fluent speech, no focal motor/sensory deficits  LABORATORY DATA:  I have reviewed Susan data as listed    Latest Ref Rng & Units 10/30/2021    1:33 PM 07/20/2021    2:24 PM 06/04/2021    4:07 PM  CBC  WBC 4.0 - 10.5 K/uL 10.4   9.9   10.1    Hemoglobin 12.0 - 15.0 g/dL 12.0   12.2   12.3    Hematocrit 36.0 - 46.0 % 35.0   37.6   37.1    Platelets 150 - 400 K/uL 314   320   306          Latest Ref Rng & Units 10/30/2021    1:33 PM 06/04/2021    4:07 PM 04/23/2021    4:39 AM  CMP  Glucose 70 - 99 mg/dL 146   101   100    BUN 6 - 20 mg/dL $Remove'17   28   17    'yPBEXlh$ Creatinine 0.44 - 1.00 mg/dL 0.80   0.87   0.63    Sodium 135 - 145 mmol/L 137   136   142    Potassium 3.5 - 5.1 mmol/L 3.9   4.3   4.1    Chloride 98 - 111 mmol/L 105   104   113    CO2 22 - 32 mmol/L $RemoveB'25   22   21    'yVCcoTFe$ Calcium 8.9 - 10.3 mg/dL 9.3   9.4   8.4    Total Protein 6.5 - 8.1 g/dL 6.8   7.5     Total Bilirubin 0.3 - 1.2 mg/dL 0.3   0.3     Alkaline Phos 38 - 126 U/L 58   71     AST 15 - 41 U/L 30   32     ALT 0 - 44 U/L 45   46         RADIOGRAPHIC STUDIES: I have personally reviewed Susan radiological images as listed and agreed with Susan findings in Susan report. CT CHEST ABDOMEN PELVIS W CONTRAST  Result Date: 10/30/2021  CLINICAL DATA:  History of breast cancer 5 years ago. Recent diagnosis of cervical cancer. Now with left-sided abdominal pain and nausea. Previous CT showed possible liver mass. * Tracking Code: BO *  EXAM: CT CHEST, ABDOMEN, AND PELVIS WITH CONTRAST TECHNIQUE: Multidetector CT imaging of Susan chest, abdomen and pelvis was performed following Susan standard protocol during bolus administration of intravenous contrast. RADIATION DOSE REDUCTION: This exam was performed according to Susan departmental dose-optimization program which includes automated exposure control, adjustment of the mA and/or kV according to patient size and/or use of iterative reconstruction technique. CONTRAST:  129mL OMNIPAQUE IOHEXOL 300 MG/ML  SOLN COMPARISON:  CT stone study 04/19/2021 FINDINGS: CT CHEST FINDINGS Cardiovascular: Susan heart size is normal. No substantial pericardial effusion. Coronary artery calcification is evident. Mild atherosclerotic calcification is noted in Susan wall of Susan thoracic aorta. Mediastinum/Nodes: No mediastinal lymphadenopathy. There is no hilar lymphadenopathy. Susan esophagus has normal imaging features. There is no axillary lymphadenopathy. Lungs/Pleura: 2 mm left lower lobe pulmonary nodule identified on image 65 of series 4. No suspicious pulmonary nodule or mass. No focal airspace consolidation. There is no evidence of pleural effusion. Musculoskeletal: No worrisome lytic or sclerotic osseous abnormality. CT ABDOMEN PELVIS FINDINGS Hepatobiliary: Susan liver shows diffusely decreased attenuation suggesting fat deposition. 2.4 cm subtle ill-defined area of decreased attenuation in Susan central liver near Susan junction of Susan left and right hepatic lobes (image 96/series 2) is probable focal fatty deposition. There is no evidence for gallstones, gallbladder wall thickening, or pericholecystic fluid. Common bile duct measures 7 mm diameter in Susan head of pancreas, mildly increased from 5-6 mm (remeasured) on Susan prior study. Pancreas: No focal mass lesion. No dilatation of Susan main duct. No intraparenchymal cyst. No peripancreatic edema. Spleen: No splenomegaly. No focal mass lesion. Adrenals/Urinary Tract: No  adrenal nodule or mass. Right kidney unremarkable. Left kidney atrophic with 5 mm nonobstructing stone in Susan lower pole. Dominant larger stone seen in Susan left renal pelvis and lower pole left kidney previously are no longer evident. 11 mm hypoattenuating lesion interpolar left kidney on image 18/12 was not visible on prior studies performed without intravenous contrast. This was visible on contrast infused CT of 12/15/2017 and measured 8 mm at that time. No evidence for hydroureter. Susan urinary bladder appears normal for Susan degree of distention. Stomach/Bowel: Stomach is unremarkable. No gastric wall thickening. No evidence of outlet obstruction. Duodenum is normally positioned as is Susan ligament of Treitz. No small bowel wall thickening. No small bowel dilatation. Susan terminal ileum is normal. Susan appendix is normal. No gross colonic mass. No colonic wall thickening. Moderate stool volume distributed along Susan length of Susan colon. Vascular/Lymphatic: There is moderate atherosclerotic calcification of Susan abdominal aorta without aneurysm. SMA stent evident. Patency of Susan SMA through Susan stent device cannot be confirmed on this exam and imaging features suggests that it is occluded at Susan level of Susan stent. There is no gastrohepatic or hepatoduodenal ligament lymphadenopathy. No retroperitoneal or mesenteric lymphadenopathy. No pelvic sidewall lymphadenopathy. 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  There is no adnexal mass. Other: No intraperitoneal free fluid. Musculoskeletal: No worrisome lytic or sclerotic osseous abnormality. IMPRESSION: 1. No definite findings of metastatic disease in Susan chest, abdomen, or pelvis. 2. 2 mm left lower lobe pulmonary nodule. Although likely benign, if Susan patient is high-risk, given Susan morphology and/or location of this nodule a non-contrast chest CT can be considered in 12 months.This recommendation follows Susan consensus  statement: Guidelines for Management of Incidental Pulmonary Nodules Detected on CT Images: From Susan Fleischner Society 2017; Radiology 2017; 284:228-243. 3. 2.4 cm subtle ill-defined area of decreased attenuation in Susan central liver near Susan junction of Susan left and right hepatic lobes. This is most likely focal fatty deposition. However, given patient history, MRI of Susan abdomen with and without contrast recommended to confirm. 4. 11 mm hypoattenuating lesion interpolar left kidney, not visible on prior studies performed without intravenous contrast. This was visible on contrast infused CT of 12/15/2017 and measured 8 mm at that time. This is almost assuredly benign and is likely a small cyst. This could also be better assessed at Susan time of follow-up MRI. 5. SMA stent with apparent occlusion at Susan level of Susan stent. 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 6. Moderate stool volume. Imaging features could be compatible with clinical constipation. 7. Aortic Atherosclerosis (ICD10-I70.0). Electronically Signed   By: Kennith Center M.D.   On: 10/30/2021 12:56     ASSESSMENT & PLAN:  Susan Benton is a 51 y.o. female with   1. Stage Ia right breast IDC, ER/PR+, HER2- -presented with right breast pain. B/l MM and [place] on 10/01/16 showed a 1 cm irregular right breast mass at 10 o'clock with calcifications, bilateral fibroadenomas, and a benign right breast cyst at 9 o'clock. Biopsy 10/08/16 confirmed IDC, grade 1-2, with DCIS -she proceeded to bilateral mastectomies on 10/23/16 under Dr. Lovell Sheehan. Right breast path showed multifocal IDC, grade 1, 4.6 cm and 0.6 cm, and low grade DCIS. Left breast was benign. -oncotype recurrence score of 9 (low risk).  -Began tamoxifen around 02/2017.  -Underwent hysterectomy with bilateral salpingo-oophorectomy on 07/26/2021 -Recommend to continue AI for additional 5 years, until 2028.   2. Symptom Management: weight loss -weight has oscillated since  January 2023, now 215. Likely secondary to early satiety. Suggested referral to GI    3. 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 She had breast reconstruction under Dr. Ulice Bold but ultimately ended up having Susan tissue expanders removed. -takes gabapentin as needed.  4. Arthritis -at baseline, in her fingers, shoulders, knees.  5. Liver abnormality/left kidney lesion: -Xray from 10/17/2021 showed hepatomegaly with local mass effect.  -CT scan from today showed 2.4 cm subtle ill-defined area of decreased attenuation in Susan central liver near Susan junction of Susan left and right hepatic lobes. This is most likely focal fatty deposition. 11 mm left kidney lesion, visible on CT scan from 12/15/2017, like benign.  -Recommend MR abdomen to further evaluate above CT scan findings.   Plan: -Labs from today were reviewed and require no intervention. -MR abdomen to evaluate liver abnormality -Re-sent referral to genetics -Continue on tamoxifen for now but will discuss with Dr. Mosetta Putt to switch to exemestane since undergoing hysterectomy and bilateral salpingo-oophorectomy. -RTC in 1 year with labs.   Orders Placed This Encounter  Procedures   MR Abdomen W Wo Contrast    Standing Status:   Future    Standing Expiration Date:   10/31/2022    Order Specific Question:   If indicated for Susan ordered procedure, I authorize Susan administration of contrast media per Radiology protocol    Answer:   Yes    Order Specific Question:   What is Susan patient's sedation requirement?    Answer:   No Sedation    Order Specific Question:   Does Susan patient have a pacemaker or implanted devices?    Answer:  No    Order Specific Question:   Preferred imaging location?    Answer:   [facility] (table limit - 550 lbs)   All questions were answered. Susan patient knows to call Susan clinic with any problems, questions or concerns. No barriers to learning was detected.  I have spent a total of 30 minutes  minutes of face-to-face and non-face-to-face time, preparing to see Susan Benton a medically appropriate examination, counseling and educating Susan patient, ordering tests/procedures, referring and communicating with other health care professionals, documenting clinical information in Susan electronic health record, and care coordination.   Dede Query PA-C Dept of Hematology and [place] at [facility] Phone: [phone]

## 2021-10-31 ENCOUNTER — Other Ambulatory Visit: Payer: Self-pay

## 2021-10-31 LAB — CANCER ANTIGEN 27.29: CA 27.29: 11.7 U/mL (ref 0.0–38.6)

## 2021-11-01 ENCOUNTER — Encounter: Payer: Self-pay | Admitting: Physician Assistant

## 2021-11-01 ENCOUNTER — Telehealth: Payer: Self-pay | Admitting: Hematology

## 2021-11-01 NOTE — Telephone Encounter (Signed)
.  Called patient to schedule appointment per 6/8 inbasket, patient is aware of date and time.   

## 2021-11-05 ENCOUNTER — Telehealth: Payer: Self-pay

## 2021-11-05 NOTE — Telephone Encounter (Signed)
Pt left msg on triage requesting call back. Called back and she said last night she had intercourse and during intercourse she started bleeding and bad pain. She mentioned her partners penis is large and she's afraid he hit the pouch Dr Kenton Kingfisher did on her surgery. Denies vaginal bleeding and severe pain now. She says she feels some discomfort in her pelvic area only. Advised I would send Dr Linna Caprice since she recently had a post op visit with her on 09/25/21. Aware to go to ER if heavy vag bleeding and/or severe pelvic pain occurred.

## 2021-11-06 NOTE — Telephone Encounter (Signed)
Pt aware. Offered to hold to transfer to [place] and she said she was going to call back in a few mins to schedule appt.

## 2021-11-08 ENCOUNTER — Ambulatory Visit: Payer: Managed Care, Other (non HMO) | Admitting: Anesthesiology

## 2021-11-08 ENCOUNTER — Inpatient Hospital Stay
Admission: RE | Admit: 2021-11-08 | Discharge: 2021-11-09 | DRG: 909 | Disposition: A | Discharge status: Home / Self Care | Payer: Managed Care, Other (non HMO) | Attending: Obstetrics and Gynecology | Admitting: Obstetrics and Gynecology

## 2021-11-08 ENCOUNTER — Other Ambulatory Visit: Payer: Self-pay

## 2021-11-08 ENCOUNTER — Other Ambulatory Visit: Payer: Self-pay | Admitting: Obstetrics and Gynecology

## 2021-11-08 ENCOUNTER — Encounter: Payer: Self-pay | Admitting: Obstetrics

## 2021-11-08 ENCOUNTER — Encounter: Payer: Self-pay | Admitting: Obstetrics and Gynecology

## 2021-11-08 ENCOUNTER — Encounter
Admission: RE | Disposition: A | Discharge status: Home / Self Care | Payer: Self-pay | Source: Home / Self Care | Attending: Obstetrics and Gynecology

## 2021-11-08 ENCOUNTER — Ambulatory Visit (INDEPENDENT_AMBULATORY_CARE_PROVIDER_SITE_OTHER): Payer: Managed Care, Other (non HMO) | Admitting: Obstetrics

## 2021-11-08 VITALS — BP 122/62 | Ht 69.0 in | Wt 214.0 lb

## 2021-11-08 DIAGNOSIS — T8131XA Disruption of external operation (surgical) wound, not elsewhere classified, initial encounter: Secondary | ICD-10-CM

## 2021-11-08 DIAGNOSIS — Z7982 Long term (current) use of aspirin: Secondary | ICD-10-CM

## 2021-11-08 DIAGNOSIS — Z90722 Acquired absence of ovaries, bilateral: Secondary | ICD-10-CM | POA: Diagnosis not present

## 2021-11-08 DIAGNOSIS — F419 Anxiety disorder, unspecified: Secondary | ICD-10-CM | POA: Diagnosis present

## 2021-11-08 DIAGNOSIS — N736 Female pelvic peritoneal adhesions (postinfective): Secondary | ICD-10-CM | POA: Diagnosis present

## 2021-11-08 DIAGNOSIS — Z79810 Long term (current) use of selective estrogen receptor modulators (SERMs): Secondary | ICD-10-CM | POA: Diagnosis not present

## 2021-11-08 DIAGNOSIS — N930 Postcoital and contact bleeding: Secondary | ICD-10-CM | POA: Diagnosis not present

## 2021-11-08 DIAGNOSIS — Z90710 Acquired absence of both cervix and uterus: Secondary | ICD-10-CM

## 2021-11-08 DIAGNOSIS — N946 Dysmenorrhea, unspecified: Secondary | ICD-10-CM

## 2021-11-08 DIAGNOSIS — G8929 Other chronic pain: Secondary | ICD-10-CM | POA: Diagnosis present

## 2021-11-08 DIAGNOSIS — G2581 Restless legs syndrome: Secondary | ICD-10-CM | POA: Diagnosis present

## 2021-11-08 DIAGNOSIS — Z419 Encounter for procedure for purposes other than remedying health state, unspecified: Principal | ICD-10-CM

## 2021-11-08 DIAGNOSIS — Z01812 Encounter for preprocedural laboratory examination: Secondary | ICD-10-CM

## 2021-11-08 DIAGNOSIS — Z9229 Personal history of other drug therapy: Secondary | ICD-10-CM

## 2021-11-08 DIAGNOSIS — K529 Noninfective gastroenteritis and colitis, unspecified: Secondary | ICD-10-CM

## 2021-11-08 DIAGNOSIS — Z803 Family history of malignant neoplasm of breast: Secondary | ICD-10-CM | POA: Diagnosis not present

## 2021-11-08 DIAGNOSIS — Z853 Personal history of malignant neoplasm of breast: Secondary | ICD-10-CM | POA: Diagnosis not present

## 2021-11-08 HISTORY — PX: REPAIR VAGINAL CUFF: SHX6067

## 2021-11-08 HISTORY — PX: LAPAROSCOPY: SHX197

## 2021-11-08 SURGERY — REPAIR, VAGINAL CUFF
Anesthesia: General | Site: Abdomen

## 2021-11-08 MED ORDER — LACTATED RINGERS IV SOLN: INTRAVENOUS | Status: DC

## 2021-11-08 MED ORDER — IBUPROFEN 600 MG PO TABS: 600.0000 mg | ORAL_TABLET | Freq: Four times a day (QID) | ORAL | 1 refills | Status: DC | PRN

## 2021-11-08 MED ORDER — SIMETHICONE 80 MG PO CHEW: 80.0000 mg | CHEWABLE_TABLET | Freq: Four times a day (QID) | ORAL | Status: DC | PRN

## 2021-11-08 MED ORDER — ACETAMINOPHEN 500 MG PO TABS
ORAL_TABLET | ORAL | Status: AC
  Administered 2021-11-08: 1000 mg via ORAL
  Filled 2021-11-08: qty 2

## 2021-11-08 MED ORDER — FENTANYL CITRATE (PF) 100 MCG/2ML IJ SOLN
INTRAMUSCULAR | Status: AC
  Filled 2021-11-08: qty 2

## 2021-11-08 MED ORDER — CIPROFLOXACIN HCL 500 MG PO TABS
500.0000 mg | ORAL_TABLET | Freq: Two times a day (BID) | ORAL | Status: DC
  Filled 2021-11-08 (×2): qty 1

## 2021-11-08 MED ORDER — ONDANSETRON HCL 4 MG/2ML IJ SOLN: 4.0000 mg | Freq: Four times a day (QID) | INTRAMUSCULAR | Status: DC | PRN

## 2021-11-08 MED ORDER — SUCCINYLCHOLINE CHLORIDE 200 MG/10ML IV SOSY
PREFILLED_SYRINGE | INTRAVENOUS | Status: DC | PRN
  Administered 2021-11-08: 100 mg via INTRAVENOUS

## 2021-11-08 MED ORDER — FENTANYL CITRATE (PF) 100 MCG/2ML IJ SOLN
INTRAMUSCULAR | Status: DC | PRN
Start: 2021-11-08 — End: 2021-11-08
  Administered 2021-11-08 (×4): 50 ug via INTRAVENOUS

## 2021-11-08 MED ORDER — BUPIVACAINE HCL (PF) 0.5 % IJ SOLN
INTRAMUSCULAR | Status: AC
  Filled 2021-11-08: qty 30

## 2021-11-08 MED ORDER — GABAPENTIN 300 MG PO CAPS
300.0000 mg | ORAL_CAPSULE | Freq: Three times a day (TID) | ORAL | Status: DC
  Filled 2021-11-08: qty 1

## 2021-11-08 MED ORDER — KETOROLAC TROMETHAMINE 30 MG/ML IJ SOLN
INTRAMUSCULAR | Status: AC
  Filled 2021-11-08: qty 1

## 2021-11-08 MED ORDER — GABAPENTIN 300 MG PO CAPS
ORAL_CAPSULE | ORAL | Status: AC
  Administered 2021-11-08: 300 mg via ORAL
  Filled 2021-11-08: qty 1

## 2021-11-08 MED ORDER — MIDAZOLAM HCL 2 MG/2ML IJ SOLN
INTRAMUSCULAR | Status: DC | PRN
  Administered 2021-11-08: 2 mg via INTRAVENOUS

## 2021-11-08 MED ORDER — EPHEDRINE SULFATE (PRESSORS) 50 MG/ML IJ SOLN
INTRAMUSCULAR | Status: DC | PRN
  Administered 2021-11-08: 5 mg via INTRAVENOUS
  Administered 2021-11-08: 10 mg via INTRAVENOUS

## 2021-11-08 MED ORDER — HYDROMORPHONE HCL 2 MG PO TABS: 2.0000 mg | ORAL_TABLET | Freq: Four times a day (QID) | ORAL | 0 refills | Status: DC | PRN

## 2021-11-08 MED ORDER — ACETAMINOPHEN 500 MG PO TABS: 1000.0000 mg | ORAL_TABLET | ORAL | Status: AC

## 2021-11-08 MED ORDER — ALLOPURINOL 300 MG PO TABS
300.0000 mg | ORAL_TABLET | Freq: Every day | ORAL | Status: DC
  Administered 2021-11-08: 300 mg via ORAL
  Filled 2021-11-08 (×2): qty 1

## 2021-11-08 MED ORDER — PANTOPRAZOLE SODIUM 40 MG PO TBEC
40.0000 mg | DELAYED_RELEASE_TABLET | Freq: Every day | ORAL | Status: DC
  Filled 2021-11-08: qty 1

## 2021-11-08 MED ORDER — ROCURONIUM BROMIDE 10 MG/ML (PF) SYRINGE
PREFILLED_SYRINGE | INTRAVENOUS | Status: AC
  Filled 2021-11-08: qty 10

## 2021-11-08 MED ORDER — ONDANSETRON HCL 4 MG/2ML IJ SOLN
INTRAMUSCULAR | Status: DC | PRN
  Administered 2021-11-08: 4 mg via INTRAVENOUS

## 2021-11-08 MED ORDER — CEFAZOLIN SODIUM 1 G IJ SOLR
INTRAMUSCULAR | Status: AC
  Filled 2021-11-08: qty 20

## 2021-11-08 MED ORDER — GABAPENTIN 300 MG PO CAPS: 300.0000 mg | ORAL_CAPSULE | ORAL | Status: AC

## 2021-11-08 MED ORDER — LIDOCAINE HCL (PF) 2 % IJ SOLN
INTRAMUSCULAR | Status: AC
  Filled 2021-11-08: qty 5

## 2021-11-08 MED ORDER — HYDROMORPHONE HCL 1 MG/ML IJ SOLN
0.5000 mg | INTRAMUSCULAR | Status: DC | PRN
  Administered 2021-11-08 – 2021-11-09 (×4): 0.5 mg via INTRAVENOUS
  Filled 2021-11-08 (×4): qty 0.5

## 2021-11-08 MED ORDER — ESTROGENS CONJUGATED 0.625 MG/GM VA CREA
TOPICAL_CREAM | VAGINAL | Status: AC
  Filled 2021-11-08: qty 30

## 2021-11-08 MED ORDER — GLYCOPYRROLATE 0.2 MG/ML IJ SOLN
INTRAMUSCULAR | Status: AC
Start: 2021-11-08 — End: ?
  Filled 2021-11-08: qty 1

## 2021-11-08 MED ORDER — ONDANSETRON 4 MG PO TBDP: 4.0000 mg | ORAL_TABLET | Freq: Four times a day (QID) | ORAL | Status: DC | PRN

## 2021-11-08 MED ORDER — OXYCODONE HCL 5 MG PO TABS: 5.0000 mg | ORAL_TABLET | Freq: Once | ORAL | Status: DC | PRN

## 2021-11-08 MED ORDER — CLONIDINE HCL 0.1 MG PO TABS
0.1000 mg | ORAL_TABLET | Freq: Every day | ORAL | Status: DC
  Filled 2021-11-08 (×2): qty 1

## 2021-11-08 MED ORDER — FENTANYL CITRATE (PF) 100 MCG/2ML IJ SOLN
25.0000 ug | INTRAMUSCULAR | Status: DC | PRN
  Administered 2021-11-08 (×4): 25 ug via INTRAVENOUS

## 2021-11-08 MED ORDER — PROPOFOL 10 MG/ML IV BOLUS
INTRAVENOUS | Status: DC | PRN
  Administered 2021-11-08: 160 mg via INTRAVENOUS
  Administered 2021-11-08: 20 mg via INTRAVENOUS

## 2021-11-08 MED ORDER — METHOCARBAMOL 500 MG PO TABS
500.0000 mg | ORAL_TABLET | Freq: Every evening | ORAL | Status: DC | PRN
Start: 2021-11-08 — End: 2021-11-09

## 2021-11-08 MED ORDER — PHENYLEPHRINE 80 MCG/ML (10ML) SYRINGE FOR IV PUSH (FOR BLOOD PRESSURE SUPPORT)
PREFILLED_SYRINGE | INTRAVENOUS | Status: AC
Start: 2021-11-08 — End: ?
  Filled 2021-11-08: qty 10

## 2021-11-08 MED ORDER — GLYCOPYRROLATE 0.2 MG/ML IJ SOLN
INTRAMUSCULAR | Status: DC | PRN
  Administered 2021-11-08: .2 mg via INTRAVENOUS

## 2021-11-08 MED ORDER — MIDAZOLAM HCL 2 MG/2ML IJ SOLN
INTRAMUSCULAR | Status: AC
Start: 2021-11-08 — End: ?
  Filled 2021-11-08: qty 2

## 2021-11-08 MED ORDER — DARIFENACIN HYDROBROMIDE ER 7.5 MG PO TB24
7.5000 mg | ORAL_TABLET | Freq: Every day | ORAL | Status: DC
  Filled 2021-11-08 (×2): qty 1

## 2021-11-08 MED ORDER — BISACODYL 10 MG RE SUPP: 10.0000 mg | Freq: Every day | RECTAL | Status: DC | PRN

## 2021-11-08 MED ORDER — ROPINIROLE HCL 1 MG PO TABS: 2.0000 mg | ORAL_TABLET | Freq: Every day | ORAL | Status: DC | PRN

## 2021-11-08 MED ORDER — IRBESARTAN 150 MG PO TABS
300.0000 mg | ORAL_TABLET | Freq: Every day | ORAL | Status: DC
  Filled 2021-11-08 (×2): qty 2

## 2021-11-08 MED ORDER — SIMETHICONE 80 MG PO CHEW: 80.0000 mg | CHEWABLE_TABLET | Freq: Four times a day (QID) | ORAL | 2 refills | Status: DC | PRN

## 2021-11-08 MED ORDER — SUGAMMADEX SODIUM 200 MG/2ML IV SOLN
INTRAVENOUS | Status: DC | PRN
  Administered 2021-11-08: 200 mg via INTRAVENOUS

## 2021-11-08 MED ORDER — ORAL CARE MOUTH RINSE: 15.0000 mL | Freq: Once | OROMUCOSAL | Status: AC

## 2021-11-08 MED ORDER — TAMOXIFEN CITRATE 10 MG PO TABS
20.0000 mg | ORAL_TABLET | Freq: Two times a day (BID) | ORAL | Status: DC
  Administered 2021-11-08: 20 mg via ORAL
  Filled 2021-11-08 (×2): qty 2

## 2021-11-08 MED ORDER — BUPIVACAINE HCL (PF) 0.5 % IJ SOLN
INTRAMUSCULAR | Status: DC | PRN
  Administered 2021-11-08: 8 mL

## 2021-11-08 MED ORDER — ONDANSETRON HCL 4 MG PO TABS: 4.0000 mg | ORAL_TABLET | Freq: Four times a day (QID) | ORAL | Status: DC | PRN

## 2021-11-08 MED ORDER — GABAPENTIN 300 MG PO CAPS
ORAL_CAPSULE | ORAL | Status: AC
  Filled 2021-11-08: qty 1

## 2021-11-08 MED ORDER — ESTROGENS CONJUGATED 0.625 MG/GM VA CREA
TOPICAL_CREAM | VAGINAL | Status: DC | PRN
  Administered 2021-11-08: 1 via VAGINAL

## 2021-11-08 MED ORDER — SENNOSIDES-DOCUSATE SODIUM 8.6-50 MG PO TABS: 1.0000 | ORAL_TABLET | Freq: Every evening | ORAL | Status: DC | PRN

## 2021-11-08 MED ORDER — ALUM & MAG HYDROXIDE-SIMETH 200-200-20 MG/5ML PO SUSP: 30.0000 mL | ORAL | Status: DC | PRN

## 2021-11-08 MED ORDER — ZOLPIDEM TARTRATE 5 MG PO TABS: 5.0000 mg | ORAL_TABLET | Freq: Every evening | ORAL | Status: DC | PRN

## 2021-11-08 MED ORDER — ASPIRIN 81 MG PO TBEC
81.0000 mg | DELAYED_RELEASE_TABLET | Freq: Every day | ORAL | Status: DC
  Filled 2021-11-08: qty 1

## 2021-11-08 MED ORDER — CLOPIDOGREL BISULFATE 75 MG PO TABS
75.0000 mg | ORAL_TABLET | Freq: Every day | ORAL | Status: DC
  Filled 2021-11-08 (×2): qty 1

## 2021-11-08 MED ORDER — MENTHOL 3 MG MT LOZG: 1.0000 | LOZENGE | OROMUCOSAL | Status: DC | PRN

## 2021-11-08 MED ORDER — ALPRAZOLAM 0.5 MG PO TABS
0.5000 mg | ORAL_TABLET | Freq: Two times a day (BID) | ORAL | Status: DC
  Administered 2021-11-08: 0.5 mg via ORAL
  Filled 2021-11-08: qty 1

## 2021-11-08 MED ORDER — CHLORHEXIDINE GLUCONATE 0.12 % MT SOLN
OROMUCOSAL | Status: AC
  Administered 2021-11-08: 15 mL via OROMUCOSAL
  Filled 2021-11-08: qty 15

## 2021-11-08 MED ORDER — HYDROMORPHONE HCL 1 MG/ML IJ SOLN
0.5000 mg | INTRAMUSCULAR | Status: DC | PRN
  Administered 2021-11-08 (×2): 0.5 mg via INTRAVENOUS

## 2021-11-08 MED ORDER — KETOROLAC TROMETHAMINE 30 MG/ML IJ SOLN
INTRAMUSCULAR | Status: DC | PRN
  Administered 2021-11-08: 30 mg via INTRAVENOUS

## 2021-11-08 MED ORDER — METRONIDAZOLE 500 MG PO TABS: 500.0000 mg | ORAL_TABLET | Freq: Three times a day (TID) | ORAL | 0 refills | Status: AC

## 2021-11-08 MED ORDER — LIDOCAINE HCL (CARDIAC) PF 100 MG/5ML IV SOSY
PREFILLED_SYRINGE | INTRAVENOUS | Status: DC | PRN
  Administered 2021-11-08: 50 mg via INTRAVENOUS

## 2021-11-08 MED ORDER — ATENOLOL 100 MG PO TABS
100.0000 mg | ORAL_TABLET | Freq: Every day | ORAL | Status: DC
  Administered 2021-11-08: 100 mg via ORAL
  Filled 2021-11-08: qty 1

## 2021-11-08 MED ORDER — ROCURONIUM BROMIDE 100 MG/10ML IV SOLN
INTRAVENOUS | Status: DC | PRN
  Administered 2021-11-08: 50 mg via INTRAVENOUS
  Administered 2021-11-08: 10 mg via INTRAVENOUS
  Administered 2021-11-08: 20 mg via INTRAVENOUS

## 2021-11-08 MED ORDER — POTASSIUM CITRATE ER 10 MEQ (1080 MG) PO TBCR
10.0000 meq | EXTENDED_RELEASE_TABLET | Freq: Two times a day (BID) | ORAL | Status: DC
  Administered 2021-11-08: 10 meq via ORAL
  Filled 2021-11-08 (×2): qty 1

## 2021-11-08 MED ORDER — DEXAMETHASONE SODIUM PHOSPHATE 10 MG/ML IJ SOLN
INTRAMUSCULAR | Status: DC | PRN
  Administered 2021-11-08: 10 mg via INTRAVENOUS

## 2021-11-08 MED ORDER — SODIUM CHLORIDE 0.9 % IR SOLN
Status: DC | PRN
  Administered 2021-11-08: 1000 mL

## 2021-11-08 MED ORDER — OXYCODONE HCL 5 MG/5ML PO SOLN: 5.0000 mg | Freq: Once | ORAL | Status: DC | PRN

## 2021-11-08 MED ORDER — DOCUSATE SODIUM 100 MG PO CAPS
100.0000 mg | ORAL_CAPSULE | Freq: Two times a day (BID) | ORAL | Status: DC
  Administered 2021-11-08: 100 mg via ORAL
  Filled 2021-11-08: qty 1

## 2021-11-08 MED ORDER — SUCCINYLCHOLINE CHLORIDE 200 MG/10ML IV SOSY
PREFILLED_SYRINGE | INTRAVENOUS | Status: AC
  Filled 2021-11-08: qty 10

## 2021-11-08 MED ORDER — OXYCODONE HCL 5 MG PO TABS
15.0000 mg | ORAL_TABLET | ORAL | Status: DC | PRN
  Administered 2021-11-08: 15 mg via ORAL
  Filled 2021-11-08: qty 3

## 2021-11-08 MED ORDER — HYDROMORPHONE HCL 1 MG/ML IJ SOLN
INTRAMUSCULAR | Status: AC
  Filled 2021-11-08: qty 1

## 2021-11-08 MED ORDER — DOCUSATE SODIUM 100 MG PO CAPS: 100.0000 mg | ORAL_CAPSULE | Freq: Two times a day (BID) | ORAL | 2 refills | Status: DC | PRN

## 2021-11-08 MED ORDER — PHENYLEPHRINE HCL (PRESSORS) 10 MG/ML IV SOLN
INTRAVENOUS | Status: DC | PRN
  Administered 2021-11-08: 80 ug via INTRAVENOUS
  Administered 2021-11-08: 160 ug via INTRAVENOUS
  Administered 2021-11-08 (×2): 80 ug via INTRAVENOUS

## 2021-11-08 MED ORDER — CEFAZOLIN SODIUM-DEXTROSE 2-3 GM-%(50ML) IV SOLR
INTRAVENOUS | Status: DC | PRN
  Administered 2021-11-08: 2 g via INTRAVENOUS

## 2021-11-08 MED ORDER — CHLORHEXIDINE GLUCONATE 0.12 % MT SOLN: 15.0000 mL | Freq: Once | OROMUCOSAL | Status: AC

## 2021-11-08 MED ORDER — EPHEDRINE 5 MG/ML INJ
INTRAVENOUS | Status: AC
  Filled 2021-11-08: qty 5

## 2021-11-08 MED ORDER — PREMARIN 0.625 MG/GM VA CREA: 1.0000 | TOPICAL_CREAM | Freq: Every day | VAGINAL | 12 refills | Status: DC

## 2021-11-08 SURGICAL SUPPLY — 48 items
BACTOSHIELD CHG 4% 4OZ (MISCELLANEOUS) ×1
BAG URINE DRAIN 2000ML AR STRL (UROLOGICAL SUPPLIES) ×3 IMPLANT
BARRIER ADHS 3X4 INTERCEED (GAUZE/BANDAGES/DRESSINGS) ×1 IMPLANT
BASIN GRAD PLASTIC 32OZ STRL (MISCELLANEOUS) ×1 IMPLANT
BLADE SURG SZ10 CARB STEEL (BLADE) ×3 IMPLANT
CATH FOLEY 2WAY  5CC 16FR (CATHETERS) ×1
CATH URTH 16FR FL 2W BLN LF (CATHETERS) ×2 IMPLANT
COUNTER NEEDLE 20/40 LG (NEEDLE) ×1 IMPLANT
DRAPE 3/4 80X56 (DRAPES) ×3 IMPLANT
DRAPE PERI LITHO V/GYN (MISCELLANEOUS) ×3 IMPLANT
DRAPE UNDER BUTTOCK W/FLU (DRAPES) ×3 IMPLANT
ELECT REM PT RETURN 9FT ADLT (ELECTROSURGICAL) ×3
ELECTRODE REM PT RTRN 9FT ADLT (ELECTROSURGICAL) ×2 IMPLANT
GAUZE 4X4 16PLY ~~LOC~~+RFID DBL (SPONGE) ×6 IMPLANT
GAUZE PACK 2X3YD (PACKING) ×1 IMPLANT
GLOVE BIO SURGEON STRL SZ 6.5 (GLOVE) ×3 IMPLANT
GLOVE SURG UNDER LTX SZ7 (GLOVE) ×3 IMPLANT
GOWN STRL REUS W/ TWL LRG LVL3 (GOWN DISPOSABLE) ×6 IMPLANT
GOWN STRL REUS W/ TWL XL LVL3 (GOWN DISPOSABLE) ×2 IMPLANT
GOWN STRL REUS W/TWL LRG LVL3 (GOWN DISPOSABLE) ×3
GOWN STRL REUS W/TWL XL LVL3 (GOWN DISPOSABLE) ×1
HANDLE YANKAUER SUCT BULB TIP (MISCELLANEOUS) ×1 IMPLANT
IRRIGATION STRYKERFLOW (MISCELLANEOUS) IMPLANT
IRRIGATOR STRYKERFLOW (MISCELLANEOUS) ×3
KIT TURNOVER CYSTO (KITS) ×3 IMPLANT
MANIFOLD NEPTUNE II (INSTRUMENTS) ×3 IMPLANT
NS IRRIG 500ML POUR BTL (IV SOLUTION) ×3 IMPLANT
PACK BASIN MINOR ARMC (MISCELLANEOUS) ×3 IMPLANT
PAD OB MATERNITY 4.3X12.25 (PERSONAL CARE ITEMS) ×3 IMPLANT
PAD PREP 24X41 OB/GYN DISP (PERSONAL CARE ITEMS) ×3 IMPLANT
PENCIL ELECTRO HAND CTR (MISCELLANEOUS) ×4 IMPLANT
SCISSORS METZENBAUM CVD 33 (INSTRUMENTS) ×1 IMPLANT
SCRUB CHG 4% DYNA-HEX 4OZ (MISCELLANEOUS) ×2 IMPLANT
SET TUBE SMOKE EVAC HIGH FLOW (TUBING) ×1 IMPLANT
SLEEVE ENDOPATH XCEL 5M (ENDOMECHANICALS) ×2 IMPLANT
SUT CHROMIC 2 0 CT 1 (SUTURE) ×6 IMPLANT
SUT VIC AB 0 CT1 18XCR BRD 8 (SUTURE) IMPLANT
SUT VIC AB 0 CT1 27 (SUTURE) ×1
SUT VIC AB 0 CT1 27XCR 8 STRN (SUTURE) ×2 IMPLANT
SUT VIC AB 0 CT1 36 (SUTURE) ×4 IMPLANT
SUT VIC AB 0 CT1 8-18 (SUTURE) ×1
SUT VIC AB 0 CT2 27 (SUTURE) ×5 IMPLANT
SUT VIC AB 2-0 UR6 27 (SUTURE) ×6 IMPLANT
SUT VIC AB 4-0 SH 27 (SUTURE) ×1
SUT VIC AB 4-0 SH 27XANBCTRL (SUTURE) IMPLANT
SYR 10ML LL (SYRINGE) ×3 IMPLANT
TROCAR XCEL NON-BLD 5MMX100MML (ENDOMECHANICALS) ×1 IMPLANT
WATER STERILE IRR 500ML POUR (IV SOLUTION) ×3 IMPLANT

## 2021-11-08 NOTE — Anesthesia Preprocedure Evaluation (Addendum)
Anesthesia Evaluation  Patient identified by MRN, date of birth, ID band Patient awake    Reviewed: Allergy & Precautions, NPO status , Patient's Chart, lab work & pertinent test results  History of Anesthesia Complications (+) PONV and history of anesthetic complications  Airway Mallampati: III  TM Distance: <3 FB Neck ROM: full    Dental  (+) Chipped   Pulmonary neg shortness of breath, former smoker,    Pulmonary exam normal        Cardiovascular Exercise Tolerance: Good hypertension, (-) angina(-) Past MI Normal cardiovascular exam     Neuro/Psych  Headaches, PSYCHIATRIC DISORDERS    GI/Hepatic negative GI ROS, Neg liver ROS, (+) Hepatitis -  Endo/Other  negative endocrine ROS  Renal/GU      Musculoskeletal   Abdominal   Peds  Hematology negative hematology ROS (+)   Anesthesia Other Findings Concern for sepsis based on hemodynamics, no cardiac symptoms  Past Medical History: No date: Anxiety No date: Arthritis 2013: Depression 10/04/2016: Fibroids, submucosal No date: Headache 06/27/2014: Hep C w/o coma, chronic ([place])     Comment:  had treatment  No date: History of kidney stones 2014: Hx of right and left heart catheterization     Comment:  had cath in [place], no intervention 1987: Hypertension 10/14/2016: Invasive ductal carcinoma of breast (HCC) No date: Low iron No date: Mesenteric thrombosis (HCC)     Comment:  after taking tamoxifan-on plavix and asa No date: PONV (postoperative nausea and vomiting) No date: Restless legs syndrome  Past Surgical History: 11/20/2016: AXILLARY SENTINEL NODE BIOPSY; Right     Comment:  Procedure: AXILLARY SENTINEL NODE BIOPSY;  Surgeon:               Aviva Signs, MD;  Location: [facility];  Service: General;               Laterality: Right;  Sentinel Node Injection @ 10:30am 2022: BLADDER REPAIR 12/25/2016: BREAST RECONSTRUCTION WITH PLACEMENT OF TISSUE  EXPANDER  AND FLEX HD (ACELLULAR HYDRATED DERMIS); Bilateral     Comment:  Procedure: BREAST RECONSTRUCTION WITH PLACEMENT OF               TISSUE EXPANDER AND FLEX HD (ACELLULAR HYDRATED DERMIS);               Surgeon: Wallace Going, DO;  Location: [place];  Service: Plastics;  Laterality:               Bilateral; No date: CARDIAC CATHETERIZATION 07/26/2021: CYSTOSCOPY     Comment:  Procedure: CYSTOSCOPY;  Surgeon: Gae Dry, MD;                Location: [facility];  Service: Gynecology;; 10/23/2016: [place]; N/A     Comment:  Procedure: DILATATION & CURETTAGE/HYSTEROSCOPY WITH               NOVASURE ENDOMETRIAL ABLATION;  Surgeon: Florian Buff,               MD;  Location: [facility];  Service: Gynecology;  Laterality:              N/A; No date: KIDNEY CYST REMOVAL 10/23/2016: MASTECTOMY MODIFIED RADICAL; Right     Comment:  Procedure: MASTECTOMY MODIFIED RADICAL;  Surgeon:  Aviva Signs, MD;  [facility]: [facility];  Service: General;               Laterality: Right; No date: ROTATOR CUFF REPAIR 10/23/2016: SIMPLE MASTECTOMY WITH AXILLARY SENTINEL NODE BIOPSY; Left     Comment:  Procedure: SIMPLE MASTECTOMY;  Surgeon: Aviva Signs,               MD;  [facility]: [facility];  Service: General;  Laterality:               Left; 02/27/2017: TISSUE EXPANDER PLACEMENT; Bilateral     Comment:  Procedure: REMOVAL TISSUE EXPANDER;  Surgeon:               Wallace Going, DO;  Location: [place];  Service:               Plastics;  Laterality: Bilateral; 1984: TONSILECTOMY, ADENOIDECTOMY, BILATERAL MYRINGOTOMY AND TUBES;  Bilateral 07/26/2021: TOTAL LAPAROSCOPIC HYSTERECTOMY WITH BILATERAL SALPINGO  OOPHORECTOMY; Bilateral     Comment:  Procedure: TOTAL LAPAROSCOPIC HYSTERECTOMY WITH               BILATERAL SALPINGO OOPHORECTOMY;  Surgeon: Gae Dry, MD;  Location: [facility];   Service: Gynecology;                Laterality: Bilateral; 1998: TUBAL LIGATION     Comment:  2000     Reproductive/Obstetrics negative OB ROS                             Anesthesia Physical Anesthesia Plan  ASA: 4 and emergent  Anesthesia Plan: General ETT   Post-op Pain Management:    Induction: Intravenous  PONV Risk Score and Plan: Ondansetron, Dexamethasone, Midazolam and Treatment may vary due to age or medical condition  Airway Management Planned: Oral ETT  Additional Equipment:   Intra-op Plan:   Post-operative Plan: Extubation in OR  Informed Consent: I have reviewed the patients History and Physical, chart, labs and discussed the procedure including the risks, benefits and alternatives for the proposed anesthesia with the patient or authorized representative who has indicated his/her understanding and acceptance.     Dental Advisory Given  Plan Discussed with: Anesthesiologist, CRNA and Surgeon  Anesthesia Plan Comments: (Patient consented for risks of anesthesia including but not limited to:  - adverse reactions to medications - damage to eyes, teeth, lips or other oral mucosa - nerve damage due to positioning  - sore throat or hoarseness - Damage to heart, brain, nerves, lungs, other parts of body or loss of life  Patient voiced understanding.)       Anesthesia Quick Evaluation

## 2021-11-08 NOTE — Anesthesia Procedure Notes (Signed)
Procedure Name: Intubation Date/Time: 11/08/2021 1:56 PM  Performed by: Rolla Plate, CRNAPre-anesthesia Checklist: Patient identified, Patient being monitored, Timeout performed, Emergency Drugs available and Suction available Patient Re-evaluated:Patient Re-evaluated prior to induction Oxygen Delivery Method: Circle system utilized Preoxygenation: Pre-oxygenation with 100% oxygen Induction Type: IV induction and Rapid sequence Laryngoscope Size: 3 and McGraph Grade View: Grade I Tube type: Oral Tube size: 7.0 mm Number of attempts: 1 Airway Equipment and Method: Stylet and Video-laryngoscopy Placement Confirmation: ETT inserted through vocal cords under direct vision, positive ETCO2 and breath sounds checked- equal and bilateral Secured at: 21 cm Tube secured with: Tape Dental Injury: Teeth and Oropharynx as per pre-operative assessment

## 2021-11-08 NOTE — Discharge Instructions (Signed)
General Gynecological Post-Operative Instructions You may expect to feel dizzy, weak, and drowsy for as long as 24 hours after receiving the medicine that made you sleep (anesthetic).  Do not drive a car, ride a bicycle, participate in physical activities, or take public transportation until you are done taking narcotic pain medicines or as directed by your doctor.  Do not drink alcohol or take tranquilizers.  Do not take medicine that has not been prescribed by your doctor.  Do not sign important papers or make important decisions while on narcotic pain medicines.  Have a responsible person with you.  CARE OF INCISION  Keep incision clean and dry. Take showers instead of baths until your doctor gives you permission to take baths.  Avoid heavy lifting (more than 10 pounds/4.5 kilograms), pushing, or pulling.  Avoid activities that may risk injury to your surgical site.  No sexual intercourse or placement of anything in the vagina for 12 weeks or as instructed by your doctor. If you have tubes coming from the wound site, check with your doctor regarding appropriate care of the tubes. Only take prescription or over-the-counter medicines  for pain, discomfort, or fever as directed by your doctor. Do not take aspirin. It can make you bleed. Take medicines (antibiotics) that kill germs if they are prescribed for you.  Call the office or go to the Emergency Room if:  You feel sick to your stomach (nauseous).  You start to throw up (vomit).  You have trouble eating or drinking.  You have an oral temperature above 101.  You have constipation that is not helped by adjusting diet or increasing fluid intake. Pain medicines are a common cause of constipation.  You have any other concerns. SEEK IMMEDIATE MEDICAL CARE IF:  You have persistent dizziness.  You have difficulty breathing or a congested sounding (croupy) cough.  You have an oral temperature above 102.5, not controlled by medicine.  There is  increasing pain or tenderness near or in the surgical site.

## 2021-11-08 NOTE — Progress Notes (Addendum)
Patient is a 51 y.o. G2P2 emale who presented from [facility] cleaning his complaints of vaginal discomfort and postcoital bleeding. She does have a previous history of breast cancer, currently on tamoxifen therapy. She is approximately three months status post TLH/BSO. On exam performed by Dr. April Manson,, it was noted that patient had a vaginal cuff dehiscence with the presence of bowel in the vagina. Patient was sent over from [facility] for surgical repair. Due to the nature of the case I would deemed this emergent.  Rubie Maid, MDf

## 2021-11-08 NOTE — Op Note (Signed)
Susan Benton Date of operation:  11/08/2021  Laparoscopic lysis of adhesion   Findings: segment of small bowel chronically inflamed , bowel was serving as a plug for vaginal cuff tear  I Was asked to see the patient as an intraoperative consult.  Dr. Marcelline Mates restarted laparoscopic procedure and there were 3 laparoscopic ports in place. Patient had history of hysterectomy and now a vaginal cuff dehiscence. I went ahead and scrubbed in, change scope to a 30 degree scope  and upon inspection so short segment of small bowel measuring approximately 3 cm.  There was chronic inflammatory response, the bowel was well perfused and showed no perforation or tears..  Dr. Marcelline Mates did some lysis of adhesions and the bowel was stuck and plugging the vaginal cuff.  I went ahead and examined the bowel with soft graspers.  I did run the bowel from the ligament of Treitz all the way to the terminal ileum there was no evidence of bowel injury.  I did lyse some adhesions that were very flimsy within the pelvis, using the laparoscopic suction device.  I also saw the tear of the vaginal cuff.  Also examining the sigmoid colon without any evidence of any injuries.  Another view of the abdominal cavity failed to reveal any other intra-abdominal injuries at this time. I Did not think that a bowel resection was indicated at that time. I turned the case back to Dr. Marcelline Mates and she went ahead and perform a transvaginal repair of the vaginal tear    Susan Hamman, MD FACS

## 2021-11-08 NOTE — Progress Notes (Signed)
Chief Complaint  Patient presents with   Dysmenorrhea    Cramping during intercourse on Sunday, bleeding started during intercourse as well, stopped within an hour afterwards.    Patient Susan Benton is an 51 y.o. year old G2P2 Patient's last menstrual period was 01/21/2017. S/p TLH BSO 07/26/21 who presents for post coital bleeding and cramping. This occurred on Sunday 6/11. She has had continued pain to 5/10. She is on oxycodone 15 mg TID prn rx by PCP Celene Squibb, MD; this takes care of the pain. She had a virtual post op. No problems until Sunday.  Patient Underwent hyst for:  Postmenopausal Bleeding Endometrial thickening by ultrasound Endometrial polyp Breast cancer History of Tamoxifen use  Sexually active with new partner x 3 mo; not active prior. Sometimes uses a lubricant. Feels like pressure or bruise.   Review of Systems  Constitutional:  Positive for fatigue. Negative for activity change, appetite change, chills, diaphoresis, fever and unexpected weight change.  HENT:  Negative for congestion, dental problem, drooling, ear discharge, ear pain, facial swelling, hearing loss, mouth sores, nosebleeds, postnasal drip, rhinorrhea, sinus pressure, sinus pain, sneezing, sore throat, tinnitus, trouble swallowing and voice change.   Eyes:  Negative for photophobia, pain, discharge, redness, itching and visual disturbance.  Respiratory:  Negative for apnea, cough, choking, chest tightness, shortness of breath, wheezing and stridor.   Cardiovascular:  Negative for chest pain, palpitations and leg swelling.  Gastrointestinal:  Negative for abdominal distention, abdominal pain, anal bleeding, blood in stool, constipation, diarrhea, nausea, rectal pain and vomiting.  Endocrine: Negative for cold intolerance, heat intolerance, polydipsia, polyphagia and polyuria.  Genitourinary:  Positive for dyspareunia and vaginal bleeding. Negative for decreased urine volume, difficulty urinating, dysuria,  enuresis, flank pain, frequency, genital sores, hematuria, menstrual problem, pelvic pain, urgency, vaginal discharge and vaginal pain.  Musculoskeletal:  Negative for arthralgias, back pain, gait problem, joint swelling, myalgias, neck pain and neck stiffness.  Skin:  Negative for color change, pallor, rash and wound.  Allergic/Immunologic: Negative for environmental allergies, food allergies and immunocompromised state.  Neurological:  Negative for dizziness, tremors, seizures, syncope, facial asymmetry, speech difficulty, weakness, light-headedness, numbness and headaches.  Hematological:  Negative for adenopathy. Does not bruise/bleed easily.  Psychiatric/Behavioral:  Negative for agitation, behavioral problems, confusion, decreased concentration, dysphoric mood, hallucinations, self-injury, sleep disturbance and suicidal ideas. The patient is not nervous/anxious and is not hyperactive.    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[place])    after taking tamoxifan-on plavix and asa   PONV (postoperative nausea and vomiting)    Restless legs syndrome    Past Surgical History:  Procedure Laterality Date   AXILLARY SENTINEL NODE BIOPSY Right 11/20/2016   Procedure: AXILLARY SENTINEL NODE BIOPSY;  Surgeon: Aviva Signs, MD;  Location: [facility];  Service: General;  Laterality: Right;  Sentinel Node Injection @ 10:30am   BLADDER REPAIR  2022   BREAST RECONSTRUCTION WITH PLACEMENT OF TISSUE EXPANDER AND FLEX HD (ACELLULAR HYDRATED DERMIS) Bilateral 12/25/2016   Procedure: BREAST RECONSTRUCTION WITH PLACEMENT  OF TISSUE  EXPANDER AND FLEX HD (ACELLULAR HYDRATED DERMIS);  Surgeon: Wallace Going, DO;  Location: [facility];  Service: Plastics;  Laterality: Bilateral;   CARDIAC CATHETERIZATION     CYSTOSCOPY  07/26/2021   Procedure: CYSTOSCOPY;  Surgeon: Gae Dry, MD;  Location: [facility];  Service: Gynecology;;   [place] N/A 10/23/2016   Procedure: DILATATION & CURETTAGE/HYSTEROSCOPY WITH NOVASURE ENDOMETRIAL ABLATION;  Surgeon: Florian Buff, MD;  Location: [facility];  Service: Gynecology;  Laterality: N/A;   KIDNEY CYST REMOVAL     MASTECTOMY MODIFIED RADICAL Right 10/23/2016   Procedure: MASTECTOMY MODIFIED RADICAL;  Surgeon: Aviva Signs, MD;  Location: [facility];  Service: General;  Laterality: Right;   ROTATOR CUFF REPAIR     SIMPLE MASTECTOMY WITH AXILLARY SENTINEL NODE BIOPSY Left 10/23/2016   Procedure: SIMPLE MASTECTOMY;  Surgeon: Aviva Signs, MD;  Location: [facility];  Service: General;  Laterality: Left;   TISSUE EXPANDER PLACEMENT Bilateral 02/27/2017   Procedure: REMOVAL TISSUE EXPANDER;  Surgeon: Wallace Going, DO;  Location: [facility];  Service: Plastics;  Laterality: Bilateral;   TONSILECTOMY, ADENOIDECTOMY, BILATERAL MYRINGOTOMY AND TUBES Bilateral 1984   TOTAL LAPAROSCOPIC HYSTERECTOMY WITH BILATERAL SALPINGO OOPHORECTOMY Bilateral 07/26/2021   Procedure: TOTAL LAPAROSCOPIC HYSTERECTOMY WITH BILATERAL SALPINGO OOPHORECTOMY;  Surgeon: Gae Dry, MD;  Location: [facility];  Service: Gynecology;  Laterality: Bilateral;   TUBAL LIGATION  1998   2000   Family History  Problem Relation Age of Onset   Hypertension Mother    Cancer Mother        skin cancer   Diabetes Father    Hypertension Father    Kidney disease Father    Hyperlipidemia Father    Asthma Father    COPD Father    Heart disease Father    HIV/AIDS Brother    Cancer Maternal Grandmother        breast   Breast cancer Maternal Grandmother     Hypertension Son    Breast cancer Other        cousin had breast cancer   Social History   Socioeconomic History   Marital status: Widowed    Spouse name: Not on file   Number of children: 2   Years of education: Not on file   Highest education level: Not on file  Occupational History   Not on file  Tobacco Use   Smoking status: Former    Packs/day: 0.25    Years: 20.00    Total pack years: 5.00    Types: Cigarettes    Quit date: 11/2020    Years since quitting: 0.9   Smokeless tobacco: Never   Tobacco comments:    3-4 per day  Vaping Use   Vaping Use: Former  Substance and Sexual Activity   Alcohol use: No   Drug use: No   Sexual activity: Yes    Birth control/protection: Surgical    Comment: tubal  Other Topics Concern   Not on file  Social History Narrative   Not on file   Social Determinants of Health   Financial Resource Strain: Not on file  Food Insecurity: Not on file  Transportation Needs: Not on file  Physical Activity: Not on file  Stress: Not on file  Social Connections: Not on file  Intimate Partner Violence: Not on file    Medicine list and allergies reviewed and updated.    BP 122/62   Ht '5\' 9"'$  (1.753 m)  Wt 214 lb (97.1 kg)   LMP 01/21/2017   BMI 31.60 kg/m  Physical Exam Vitals reviewed. Exam conducted with a chaperone present.  Constitutional:      General: She is not in acute distress.    Appearance: Normal appearance. She is not ill-appearing.  HENT:     Head: Normocephalic and atraumatic.  Eyes:     Extraocular Movements: Extraocular movements intact.  Cardiovascular:     Rate and Rhythm: Normal rate.  Pulmonary:     Effort: Pulmonary effort is normal. No respiratory distress.  Genitourinary:    General: Normal vulva.     Exam position: Lithotomy position.     Pubic Area: No rash.      Labia:        Right: No rash, tenderness or lesion.        Left: No rash, tenderness or lesion.      Vagina: Normal. No vaginal  discharge, tenderness or lesions.     Uterus: Absent.      Adnexa:        Right: No tenderness or fullness.         Left: No tenderness or fullness.          Comments: Uterus and cervix absent; cuff appears open with white appearing bowel with abrasions type lesions noted; nontender with palpation with cutip- tender on bimanual exam; no obvious bleeding but there is pink discharge Musculoskeletal:        General: Normal range of motion.  Lymphadenopathy:     Lower Body: No right inguinal adenopathy. No left inguinal adenopathy.  Skin:    General: Skin is warm.  Neurological:     General: No focal deficit present.     Mental Status: She is alert and oriented to person, place, and time. Mental status is at baseline.     Coordination: Coordination normal.  Psychiatric:        Mood and Affect: Mood normal.        Behavior: Behavior normal.        Thought Content: Thought content normal.        Judgment: Judgment normal.    Vaginal cuff dehiscence, initial encounter  PCB (post coital bleeding) - Plan: NuSwab Vaginitis Plus (VG+), Wound culture  Suspect cuff dehiscense - recommend exam under anesthesia and vaginal cuff repair. I have contacted Dr Marcelline Mates on call and patient will be admitted for same day procedure.  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   Return for post op or prn .  A total of 34 minutes were spent for the patient inclusive of face-to-face time during visit, preparation, review, communication, and documentation during this encounter.

## 2021-11-08 NOTE — Patient Instructions (Signed)
Have a great year! Please call with any concerns. Don't forget to wear your seatbelt everyday! If you are not signed up on MyChart, please ask [place] how to sign up for it!   In a world where you can be anything, please be kind.   Body mass index is 31.6 kg/m.  A Healthy Lifestyle: Care Instructions Your Care Instructions  A healthy lifestyle can help you feel good, stay at a healthy weight, and have plenty of energy for both work and play. A healthy lifestyle is something you can share with your whole family. A healthy lifestyle also can lower your risk for serious health problems, such as high blood pressure, heart disease, and diabetes. You can follow a few steps listed below to improve your health and the health of your family. Follow-up care is a key part of your treatment and safety. Be sure to make and go to all appointments, and call your doctor if you are having problems. It's also a good idea to know your test results and keep a list of the medicines you take. How can you care for yourself at home? Do not eat too much sugar, fat, or fast foods. You can still have dessert and treats now and then. The goal is moderation. Start small to improve your eating habits. Pay attention to portion sizes, drink less juice and soda pop, and eat more fruits and vegetables. Eat a healthy amount of food. A 3-ounce serving of meat, for example, is about the size of a deck of cards. Fill the rest of your plate with vegetables and whole grains. Limit the amount of soda and sports drinks you have every day. Drink more water when you are thirsty. Eat at least 5 servings of fruits and vegetables every day. It may seem like a lot, but it is not hard to reach this goal. A serving or helping is 1 piece of fruit, 1 cup of vegetables, or 2 cups of leafy, raw vegetables. Have an apple or some carrot sticks as an afternoon snack instead of a candy bar. Try to have fruits and/or vegetables at every meal. Make exercise  part of your daily routine. You may want to start with simple activities, such as walking, bicycling, or slow swimming. Try to be active 30 to 60 minutes every day. You do not need to do all 30 to 60 minutes all at once. For example, you can exercise 3 times a day for 10 or 20 minutes. Moderate exercise is safe for most people, but it is always a good idea to talk to your doctor before starting an exercise program. Keep moving. Mow the lawn, work in the garden, or [employer]. Take the stairs instead of the elevator at work. If you smoke, quit. People who smoke have an increased risk for heart attack, stroke, cancer, and other lung illnesses. Quitting is hard, but there are ways to boost your chance of quitting tobacco for good. Use nicotine gum, patches, or lozenges. Ask your doctor about stop-smoking programs and medicines. Keep trying. In addition to reducing your risk of diseases in the future, you will notice some benefits soon after you stop using tobacco. If you have shortness of breath or asthma symptoms, they will likely get better within a few weeks after you quit. Limit how much alcohol you drink. Moderate amounts of alcohol (up to 2 drinks a day for men, 1 drink a day for women) are okay. But drinking too much can lead to  liver problems, high blood pressure, and other health problems. Family health If you have a family, there are many things you can do together to improve your health. Eat meals together as a family as often as possible. Eat healthy foods. This includes fruits, vegetables, lean meats and dairy, and whole grains. Include your family in your fitness plan. Most people think of activities such as jogging or tennis as the way to fitness, but there are many ways you and your family can be more active. Anything that makes you breathe hard and gets your heart pumping is exercise. Here are some tips: Walk to do errands or to take your child to school or the bus. Go for a family  bike ride after dinner instead of watching TV. Care instructions adapted under license by your healthcare professional. This care instruction is for use with your licensed healthcare professional. If you have questions about a medical condition or this instruction, always ask your healthcare professional. [place] any warranty or liability for your use of this information.

## 2021-11-08 NOTE — Op Note (Signed)
Procedure(s): REPAIR VAGINAL CUFF LAPAROSCOPY DIAGNOSTIC Procedure Note  Susan Benton female 51 y.o. 11/08/2021  Indications: The patient is a 51 y.o. G2P2 female with previous history of total laparoscopic hysterectomy with bilateral salpingo-oophorectomy ~ 3 months ago.  Presented to the office today with complaints of vaginal bleeding and pelvic cramping for 4 days.  Exam noted vaginal cuff dehiscence with a loop of bowel in the vagina.  Patient does have a past medical history significant for breast cancer, currently on tamoxifen therapy.    Pre-operative Diagnosis: Vaginal cuff dehiscence status post laparoscopic hysterectomy 3 months ago  Post-operative Diagnosis: Same  Surgeon: Rubie Maid, MD.  Intra-operative consultation by Caroleen Hamman, MD  Assistants:  Surgical scrub assist and Mariel Craft, [place].   Anesthesia: General endotracheal anesthesia  Findings: Pelvic exam with vaginal cuff with complete dehiscence.  Loop of small bowel noted in the proximal vaginal canal.  Bowel tissue overall appears healthy at this time.  Vaginal cuff with raw edges.  Procedure Details: The patient was seen in the Holding Room. The risks, benefits, complications, treatment options, and expected outcomes were discussed with the patient.  The patient concurred with the proposed plan, giving informed consent.  The site of surgery properly noted/marked. The patient was taken to the Operating Room, identified as Susan Benton and the procedure verified as Procedure(s) (LRB):REPAIR VAGINAL CUFF (N/A). LAPAROSCOPY DIAGNOSTIC (N/A). A Time Out was held and the above information confirmed.  She was then placed under general anesthesia without difficulty. She was placed in the dorsal lithotomy position, and was prepped and draped in a sterile manner.  A straight catheterization was performed. A sterile speculum was inserted into the vagina and there was noted to be a loop of small bowel in the  proximal portion of the vaginal canal.  The speculum was then removed from the patient's vagina.    Attention was turned to the abdomen where an umbilical incision was made with the scalpel.  The Optiview 5-mm trocar and sleeve were then advanced without difficulty with the laparoscope under direct visualization into the abdomen.  The abdomen was then insufflated with carbon dioxide gas and adequate pneumoperitoneum was obtained. Bilateral 5 mm lower quadrant ports were then placed under direct visualization.  A survey of the patient's pelvis and abdomen revealed the findings as above.  The patient was placed in steep Trendelenburg position and a grasper was used to remove the bowel from the vagina.  On inspection of the bowel there appeared to be an area that was denuded.  The decision was made to consult general surgery intraoperatively to run the bowel and assess for any other injury.  Dr. Caroleen Hamman was consulted and responded to the consult.  There was some adhesions of the mesentery to the vaginal cuff and left lateral surrounding tissues.  These adhesions were lysed bluntly and using the harmonic scalpel.  At this time Dr. Dahlia Byes had entered the surgical suite.  On surgical assessment he performed inspection of the bowel and the denuded area as well as the entire small bowel.  Please see surgical intraoperative consult note for details of the procedure.  Once this procedure has been performed, decision was made to return to the pelvis and complete repair of the vagina vaginally.    Attention was then turned to the pelvis where a Foley catheter was then inserted.  Next a a weighted speculum was placed inside the patient's vagina and the upper portion was retracted by a Deaver.  The vaginal  cuff was then grasped with a series of Allis clamps.  The areas of unhealed vaginal cuff were then excised until new healthy tissue was encountered Using the Bovie and a 15-blade knife.  The peritoneum was then  identified and was sutured in a pursestring fashion using a 0 Vicryl suture.  The vaginal cuff was then reapproximated using a series of 0 Vicryl sutures in a figure-of-eight fashion.  Vaginal cuff was examined and noted to be closed at both angles as well as along the midline.  All instruments were then removed from the vagina.  The vagina was then packed with a vaginal packing soaked with Premarin cream.  Attention was then returned to the abdomen where the pneumoperitoneum was then re-established, and the laparoscope was then introduced once again into the abdominal cavity.  A final survey was performed, where good hemostasis was noted thereafter.  No loops of bowel were noted at the vaginal cuff, and the cuff appeared closed.  Interceed was then placed at the vaginal cuff for prevention of further adhesions.  All trocars were removed under direct visualization, and the abdomen which was desufflated.    All skin incisions were closed with 4-0 Vicryl subcuticular stitches.  Dermabond was placed over all incisions.  The patient tolerated the procedures well.  All instruments, needles, and sponge counts were correct x 2. The patient was taken to the recovery room awake, extubated and in stable condition.   An experienced assistant was required given the standard of surgical care given the complexity of the case.  This assistant was needed for exposure, dissection, suctioning, retraction, instrument exchange, and for overall help during the procedure.   Estimated Blood Loss:  30 ml      Drains: straight catheterization prior to procedure with  330 ml of clear urine         Total IV Fluids: 1000 ml  Specimens: None         Implants: None         Complications:  None; patient tolerated the procedure well.         Disposition: PACU - hemodynamically stable.         Condition: stable   Rubie Maid, MD [facility]

## 2021-11-08 NOTE — H&P (Signed)
GYNECOLOGY PREOPERATIVE HISTORY AND PHYSICAL   Subjective:  Susan Benton is a 51 y.o. G2P2 with PMH of breast cancer on Tamoxifen therapy here for surgical management of vaginal cuff dehiscence. She is 3 months post-op s/p TLH/BSO (performed 08/16/2021).  Reporte symptoms of postcoital bleeding and pelvic cramping since Sunday 6/11. She was seen today in clinic at [facility] where she was evaluated and noted to have a vaginal cuff dehiscence with the presence of bowel.  She also has a history of chronic pain, sees pain management.   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[facility])    after taking tamoxifan-on plavix and asa   PONV (postoperative nausea and vomiting)    Restless legs syndrome     Past Surgical History:  Procedure Laterality Date   AXILLARY SENTINEL NODE BIOPSY Right 11/20/2016   Procedure: AXILLARY SENTINEL NODE BIOPSY;  Surgeon: Aviva Signs, MD;  Location: [facility];  Service: General;  Laterality: Right;  Sentinel Node Injection @ 10:30am   BLADDER REPAIR  2022   BREAST RECONSTRUCTION WITH PLACEMENT OF TISSUE EXPANDER AND FLEX HD (ACELLULAR HYDRATED DERMIS) Bilateral 12/25/2016   Procedure: BREAST RECONSTRUCTION WITH PLACEMENT OF TISSUE EXPANDER AND FLEX HD (ACELLULAR HYDRATED DERMIS);  Surgeon: Wallace Going, DO;  Location: [facility];  Service: Plastics;  Laterality: Bilateral;   CARDIAC CATHETERIZATION     CYSTOSCOPY  07/26/2021   Procedure: CYSTOSCOPY;  Surgeon: Gae Dry, MD;  Location:  [facility];  Service: Gynecology;;   [place] N/A 10/23/2016   Procedure: DILATATION & CURETTAGE/HYSTEROSCOPY WITH NOVASURE ENDOMETRIAL ABLATION;  Surgeon: Florian Buff, MD;  Location: [facility];  Service: Gynecology;  Laterality: N/A;   KIDNEY CYST REMOVAL     MASTECTOMY MODIFIED RADICAL Right 10/23/2016   Procedure: MASTECTOMY MODIFIED RADICAL;  Surgeon: Aviva Signs, MD;  Location: [facility];  Service: General;  Laterality: Right;   ROTATOR CUFF REPAIR     SIMPLE MASTECTOMY WITH AXILLARY SENTINEL NODE BIOPSY Left 10/23/2016   Procedure: SIMPLE MASTECTOMY;  Surgeon: Aviva Signs, MD;  Location: [facility];  Service: General;  Laterality: Left;   TISSUE EXPANDER PLACEMENT Bilateral 02/27/2017   Procedure: REMOVAL TISSUE EXPANDER;  Surgeon: Wallace Going, DO;  Location: [facility];  Service: Plastics;  Laterality: Bilateral;   TONSILECTOMY, ADENOIDECTOMY, BILATERAL MYRINGOTOMY AND TUBES Bilateral 1984   TOTAL LAPAROSCOPIC HYSTERECTOMY WITH BILATERAL SALPINGO OOPHORECTOMY Bilateral 07/26/2021   Procedure: TOTAL LAPAROSCOPIC HYSTERECTOMY WITH BILATERAL SALPINGO OOPHORECTOMY;  Surgeon: Gae Dry, MD;  Location: [facility];  Service: Gynecology;  Laterality: Bilateral;   TUBAL LIGATION  1998   2000    OB History  Gravida Para Term Preterm AB Living  2 2          SAB IAB Ectopic Multiple Live Births               # Outcome Date GA Lbr Len/2nd Weight Sex Delivery Anes PTL Lv  2 Para           1 Para             Family History  Problem Relation Age of Onset   Hypertension Mother    Cancer Mother        skin cancer   Diabetes Father    Hypertension Father    Kidney disease Father    Hyperlipidemia Father    Asthma Father    COPD Father    Heart disease Father    HIV/AIDS Brother    Cancer Maternal Grandmother        breast   Breast cancer Maternal Grandmother    Hypertension Son    Breast cancer Other        cousin had breast  cancer    Social History   Socioeconomic History   Marital status: Widowed    Spouse name: Not on file   Number of children: 2   Years of education: Not on file   Highest education level: Not on file  Occupational History   Not on file  Tobacco Use   Smoking status: Former    Packs/day: 0.25    Years: 20.00    Total pack years: 5.00    Types: Cigarettes    Quit date: 11/2020    Years since quitting: 0.9   Smokeless tobacco: Never   Tobacco comments:    3-4 per day  Vaping Use   Vaping Use: Former  Substance and Sexual Activity   Alcohol use: No   Drug use: No   Sexual activity: Yes    Birth control/protection: Surgical    Comment: tubal  Other Topics Concern   Not on file  Social History Narrative   Not on file   Social Determinants of Health   Financial Resource Strain: Not on file  Food Insecurity: Not on file  Transportation Needs: Not on file  Physical Activity: Not on file  Stress: Not on file  Social Connections: Not on file  Intimate Partner Violence: Not on file    No current facility-administered medications on file prior to encounter.   Current Outpatient Medications on File Prior to Encounter  Medication Sig Dispense Refill   ALPRAZolam (XANAX) 0.5 MG tablet TAKE 1 TABLET BY MOUTH TWICE A DAY AS NEEDED FOR ANXIETY (Patient taking differently: Take 0.5 mg by mouth 2 (two) times daily.) 30 tablet 0   aspirin 81 MG EC tablet Take 81 mg by mouth daily.     atenolol (TENORMIN) 100 MG tablet Take 1 tablet (100 mg total) by mouth daily. (Patient taking differently: Take 100 mg by mouth at bedtime.) 30 tablet 1   ciprofloxacin (CIPRO) 500 MG tablet Take 1 tablet (500 mg total) by mouth 2 (two) times daily. 14 tablet 0   cloNIDine (CATAPRES) 0.1 MG tablet Take 1 tablet (0.1 mg total) by mouth daily. 30 tablet 11   clopidogrel (PLAVIX) 75 MG tablet Take 75 mg by mouth daily.     gabapentin (NEURONTIN) 300 MG capsule TAKE ONE CAPSULE BY MOUTH 3 TIMES A DAY  (Patient taking differently: Take 300 mg by mouth daily as needed (Nerve pain).) 90 capsule 2   methocarbamol (ROBAXIN) 500 MG tablet Take 1 tablet (500 mg total) by mouth at bedtime as needed for muscle spasms. 10 tablet 0   olmesartan (BENICAR) 40 MG tablet Take 40 mg by mouth daily.     ondansetron (ZOFRAN-ODT) 4 MG disintegrating tablet Take 1 tablet (4 mg  total) by mouth every 6 (six) hours as needed for nausea. 20 tablet 0   oxyCODONE (ROXICODONE) 15 MG immediate release tablet Take 15 mg by mouth every 4 (four) hours as needed for pain.     rOPINIRole (REQUIP) 2 MG tablet 1-2 tabs po qhs (Patient taking differently: Take 2 mg by mouth daily as needed (Restless leg at bedtime).) 60 tablet 3   solifenacin (VESICARE) 10 MG tablet Take 10 mg by mouth every morning.     tamoxifen (NOLVADEX) 20 MG tablet TAKE 1 TABLET BY MOUTH EVERYDAY AT BEDTIME (Patient taking differently: Take 20 mg by mouth at bedtime.) 90 tablet 0   Allergies  Allergen Reactions   Tramadol Other (See Comments)    Migraine headaches     Review of Systems Constitutional: No recent fever/chills/sweats Respiratory: No recent cough/bronchitis Cardiovascular: No chest pain Gastrointestinal: No recent nausea/vomiting/diarrhea Genitourinary: No UTI symptoms Hematologic/lymphatic:No history of coagulopathy or recent blood thinner use    Objective:   Blood pressure 100/60,    Ht '5\' 9"'$  (1.753 m)   Wt 214 lb (97.1 kg)   LMP 01/21/2017   BMI 31.60 kg/m   last menstrual period 01/21/2017. CONSTITUTIONAL: Well-developed, well-nourished female in no acute distress.  HENT:  Normocephalic, atraumatic, External right and left ear normal. Oropharynx is clear and moist EYES: Conjunctivae and EOM are normal. Pupils are equal, round, and reactive to light. No scleral icterus.  NECK: Normal range of motion, supple, no masses SKIN: Skin is warm and dry. No rash noted. Not diaphoretic. No erythema. No pallor. NEUROLOGIC: Alert and  oriented to person, place, and time. Normal reflexes, muscle tone coordination. No cranial nerve deficit noted. PSYCHIATRIC: Normal mood and affect. Normal behavior. Normal judgment and thought content. CARDIOVASCULAR: Normal heart rate noted, regular rhythm RESPIRATORY: Effort and breath sounds normal, no problems with respiration noted ABDOMEN: Soft, nontender, nondistended. PELVIC: Deferred MUSCULOSKELETAL: Normal range of motion. No edema and no tenderness. 2+ distal pulses.    Labs: Results for orders placed or performed in visit on 10/30/21 (from the past 336 hour(s))  Comprehensive metabolic panel   Collection Time: 10/30/21  1:33 PM  Result Value Ref Range   Sodium 137 135 - 145 mmol/L   Potassium 3.9 3.5 - 5.1 mmol/L   Chloride 105 98 - 111 mmol/L   CO2 25 22 - 32 mmol/L   Glucose, Bld 146 (H) 70 - 99 mg/dL   BUN 17 6 - 20 mg/dL   Creatinine, Ser 0.80 0.44 - 1.00 mg/dL   Calcium 9.3 8.9 - 10.3 mg/dL   Total Protein 6.8 6.5 - 8.1 g/dL   Albumin 4.1 3.5 - 5.0 g/dL   AST 30 15 - 41 U/L   ALT 45 (H) 0 - 44 U/L   Alkaline Phosphatase 58 38 - 126 U/L   Total Bilirubin 0.3 0.3 - 1.2 mg/dL   GFR, Estimated >60 >60 mL/min   Anion gap 7 5 - 15  CBC with Differential/Platelet   Collection Time: 10/30/21  1:33 PM  Result Value Ref Range   WBC 10.4 4.0 - 10.5 K/uL   RBC 4.26 3.87 - 5.11 MIL/uL   Hemoglobin 12.0 12.0 - 15.0 g/dL   HCT 35.0 (L) 36.0 - 46.0 %   MCV 82.2 80.0 - 100.0 fL   MCH 28.2 26.0 - 34.0 pg   MCHC 34.3 30.0 - 36.0 g/dL   RDW 13.2 11.5 - 15.5 %   Platelets 314 150 - 400 K/uL   nRBC  0.0 0.0 - 0.2 %   Neutrophils Relative % 58 %   Neutro Abs 6.0 1.7 - 7.7 K/uL   Lymphocytes Relative 34 %   Lymphs Abs 3.5 0.7 - 4.0 K/uL   Monocytes Relative 5 %   Monocytes Absolute 0.5 0.1 - 1.0 K/uL   Eosinophils Relative 2 %   Eosinophils Absolute 0.2 0.0 - 0.5 K/uL   Basophils Relative 1 %   Basophils Absolute 0.1 0.0 - 0.1 K/uL   Immature Granulocytes 0 %   Abs  Immature Granulocytes 0.03 0.00 - 0.07 K/uL  Cancer antigen 27.29   Collection Time: 10/30/21  1:33 PM  Result Value Ref Range   CA 27.29 11.7 0.0 - 38.6 U/mL     Imaging Studies: CT CHEST ABDOMEN PELVIS W CONTRAST  Result Date: 10/30/2021 CLINICAL DATA:  History of breast cancer 5 years ago. Recent diagnosis of cervical cancer. Now with left-sided abdominal pain and nausea. Previous CT showed possible liver mass. * Tracking Code: BO * EXAM: CT CHEST, ABDOMEN, AND PELVIS WITH CONTRAST TECHNIQUE: Multidetector CT imaging of the chest, abdomen and pelvis was performed following the standard protocol during bolus administration of intravenous contrast. RADIATION DOSE REDUCTION: This exam was performed according to the departmental dose-optimization program which includes automated exposure control, adjustment of the mA and/or kV according to patient size and/or use of iterative reconstruction technique. CONTRAST:  169m OMNIPAQUE IOHEXOL 300 MG/ML  SOLN COMPARISON:  CT stone study 04/19/2021 FINDINGS: CT CHEST FINDINGS Cardiovascular: The heart size is normal. No substantial pericardial effusion. Coronary artery calcification is evident. Mild atherosclerotic calcification is noted in the wall of the thoracic aorta. Mediastinum/Nodes: No mediastinal lymphadenopathy. There is no hilar lymphadenopathy. The esophagus has normal imaging features. There is no axillary lymphadenopathy. Lungs/Pleura: 2 mm left lower lobe pulmonary nodule identified on image 65 of series 4. No suspicious pulmonary nodule or mass. No focal airspace consolidation. There is no evidence of pleural effusion. Musculoskeletal: No worrisome lytic or sclerotic osseous abnormality. CT ABDOMEN PELVIS FINDINGS Hepatobiliary: The liver shows diffusely decreased attenuation suggesting fat deposition. 2.4 cm subtle ill-defined area of decreased attenuation in the central liver near the junction of the left and right hepatic lobes (image 96/series 2)  is probable focal fatty deposition. There is no evidence for gallstones, gallbladder wall thickening, or pericholecystic fluid. Common bile duct measures 7 mm diameter in the head of pancreas, mildly increased from 5-6 mm (remeasured) on the prior study. Pancreas: No focal mass lesion. No dilatation of the main duct. No intraparenchymal cyst. No peripancreatic edema. Spleen: No splenomegaly. No focal mass lesion. Adrenals/Urinary Tract: No adrenal nodule or mass. Right kidney unremarkable. Left kidney atrophic with 5 mm nonobstructing stone in the lower pole. Dominant larger stone seen in the left renal pelvis and lower pole left kidney previously are no longer evident. 11 mm hypoattenuating lesion interpolar left kidney on image 18/12 was not visible on prior studies performed without intravenous contrast. This was visible on contrast infused CT of 12/15/2017 and measured 8 mm at that time. No evidence for hydroureter. The urinary bladder appears normal for the degree of distention. Stomach/Bowel: Stomach is unremarkable. No gastric wall thickening. No evidence of outlet obstruction. Duodenum is normally positioned as is the ligament of Treitz. No small bowel wall thickening. No small bowel dilatation. The terminal ileum is normal. The appendix is normal. No gross colonic mass. No colonic wall thickening. Moderate stool volume distributed along the length of the colon. Vascular/Lymphatic: There is moderate  atherosclerotic calcification of the abdominal aorta without aneurysm. SMA stent evident. Patency of the SMA through the stent device cannot be confirmed on this exam and imaging features suggests that it is occluded at the level of the stent. There is no gastrohepatic or hepatoduodenal ligament lymphadenopathy. No retroperitoneal or mesenteric lymphadenopathy. No pelvic sidewall lymphadenopathy. 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  There is no adnexal mass.  Other: No intraperitoneal free fluid. Musculoskeletal: No worrisome lytic or sclerotic osseous abnormality. IMPRESSION: 1. No definite findings of metastatic disease in the chest, abdomen, or pelvis. 2. 2 mm left lower lobe pulmonary nodule. Although likely benign, if the patient is high-risk, given the morphology and/or location of this nodule a non-contrast chest CT can be considered in 12 months.This recommendation follows the consensus statement: Guidelines for Management of Incidental Pulmonary Nodules Detected on CT Images: From the Fleischner Society 2017; Radiology 2017; 284:228-243. 3. 2.4 cm subtle ill-defined area of decreased attenuation in the central liver near the junction of the left and right hepatic lobes. This is most likely focal fatty deposition. However, given patient history, MRI of the abdomen with and without contrast recommended to confirm. 4. 11 mm hypoattenuating lesion interpolar left kidney, not visible on prior studies performed without intravenous contrast. This was visible on contrast infused CT of 12/15/2017 and measured 8 mm at that time. This is almost assuredly benign and is likely a small cyst. This could also be better assessed at the time of follow-up MRI. 5. SMA stent with apparent occlusion at the level of the stent. 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 6. Moderate stool volume. Imaging features could be compatible with clinical constipation. 7. Aortic Atherosclerosis (ICD10-I70.0). Electronically Signed   By: Misty Stanley M.D.   On: 10/30/2021 12:56    Assessment:   3 months s/p TLH/BSO with vaginal cuff dehiscence Postmenopausal female H/o breast cancer on Tamoxifen therapy   Plan:    Counseling: Procedure, risks, reasons, benefits and complications (including injury to bowel, bladder, major blood vessel, ureter, bleeding, possibility of transfusion, infection, or fistula formation) reviewed in detail. Likelihood of success in alleviating  the patient's condition was discussed. Routine postoperative instructions will be reviewed with the patient and her family in detail after surgery.  The patient concurred with the proposed plan, giving informed written consent for the surgery.   Preop testing ordered. Patient to remain NPO for procedure. She has eaten at approximately 6 or 7 this morning, however case is deemed emergent, so ok to proceed.    Rubie Maid, MD [facility]

## 2021-11-08 NOTE — Anesthesia Postprocedure Evaluation (Signed)
Anesthesia Post Note  Patient: Susan Benton  Procedure(s) Performed: REPAIR VAGINAL CUFF LAPAROSCOPY DIAGNOSTIC (Abdomen)  Patient location during evaluation: PACU Anesthesia Type: General Level of consciousness: awake and alert Pain management: pain level controlled Vital Signs Assessment: post-procedure vital signs reviewed and stable Respiratory status: spontaneous breathing, nonlabored ventilation and respiratory function stable Cardiovascular status: blood pressure returned to baseline and stable Postop Assessment: no apparent nausea or vomiting Anesthetic complications: no   No notable events documented.   Last Vitals:  Vitals:   11/08/21 1749 11/08/21 1822  BP:  137/76  Pulse: 77 72  Resp: 13 18  Temp:  36.7 C  SpO2: 95% 97%    Last Pain:  Vitals:   11/08/21 1908  TempSrc:   PainSc: [place]

## 2021-11-08 NOTE — Transfer of Care (Signed)
Immediate Anesthesia Transfer of Care Note  Patient: Susan Benton  Procedure(s) Performed: REPAIR VAGINAL CUFF LAPAROSCOPY DIAGNOSTIC (Abdomen)  Patient Location: PACU  Anesthesia Type:General  Level of Consciousness: sedated  Airway & Oxygen Therapy: Patient Spontanous Breathing and Patient connected to face mask oxygen  Post-op Assessment: Report given to RN  Post vital signs: Reviewed  Last Vitals:  Vitals Value Taken Time  BP 110/58 11/08/21 1640  Temp    Pulse 82 11/08/21 1641  Resp 13 11/08/21 1641  SpO2 100 % 11/08/21 1641  Vitals shown include unvalidated device data.  Last Pain:         Complications: No notable events documented.

## 2021-11-09 ENCOUNTER — Encounter: Payer: Self-pay | Admitting: Obstetrics and Gynecology

## 2021-11-09 DIAGNOSIS — Z9229 Personal history of other drug therapy: Secondary | ICD-10-CM

## 2021-11-09 DIAGNOSIS — Z853 Personal history of malignant neoplasm of breast: Secondary | ICD-10-CM

## 2021-11-09 MED ORDER — HYDROMORPHONE HCL 2 MG PO TABS: 2.0000 mg | ORAL_TABLET | ORAL | 0 refills | Status: DC | PRN

## 2021-11-09 NOTE — Progress Notes (Signed)
Post-Operative Day #1 s/p diagnostic laparoscopy,  vaginal cuff repair.  Previous history of TLH/BSO 3 months ago, with recent vaginal cuff dehiscence.  Subjective: no complaints, up ad lib, and tolerating PO  Objective: Temp:  [97.5 F (36.4 C)-98.3 F (36.8 C)] 98.3 F (36.8 C) (06/16 0421) Pulse Rate:  [58-82] 58 (06/16 0421) Resp:  [9-20] 20 (06/16 0421) BP: (89-146)/(51-80) 98/68 (06/16 0421) SpO2:  [94 %-100 %] 96 % (06/16 0421) Weight:  [97.1 kg] 97.1 kg (06/15 1225)  Physical Exam:  General: alert and no distress  Lungs: clear to auscultation bilaterally Heart: regular rate and rhythm, S1, S2 normal, no murmur, click, rub or gallop Abdomen: soft, non-tender; bowel sounds normal; no masses,  no organomegaly Pelvis:Bleeding: scant, vaginal packing removed.  Incision: healing well, no significant drainage, no dehiscence, no significant erythema Extremities: DVT Evaluation: No evidence of DVT seen on physical exam. Negative Homan's sign. No cords or calf tenderness. No significant calf/ankle edema.   Labs:  No results for input(s): "HGB", "HCT" in the last 72 hours.   Lab Results  Component Value Date   CREATININE 0.80 10/30/2021     Assessment/Plan: Doing well postoperatively. d/c foley Encourage ambulation Pain well controlled with PO meds. Has chronic pain meds (Oxycodone 15 mg), will send home with small prescription of Dilaudid. Can also utilize NSAIDs as needed.  Start ABX therapy with Flagyl for post-surgical prophylaxis.  Will prescribe Premarin cream postoperatively for ~ 2 weeks to promote wound healing. To use quarter-sized amount daily.  Discharge home today.     Rubie Maid, MD [facility]

## 2021-11-09 NOTE — Progress Notes (Signed)
Patient discharged home with friend. Patient voided 90 mL 1 hour after foley catheter removed, refused staying to try voiding more. Passed gas this morning. Discharge instructions, when to follow up, and prescriptions reviewed with patient. Patient verbalized understanding. Patient will be escorted out by auxiliary.

## 2021-11-11 NOTE — Discharge Summary (Signed)
Gynecology Physician Postoperative Discharge Summary  Patient ID: Susan Benton MRN: [id number] DOB/AGE: 1970/12/26 51 y.o.  Admit Date: 11/08/2021 Discharge Date: 11/11/2021  Preoperative Diagnoses: Vaginal cuff dehiscence 3 months s/p TLH/BSO.   Procedures: Procedure(s) (LRB): REPAIR VAGINAL CUFF (N/A) LAPAROSCOPY DIAGNOSTIC (N/A)  Hospital Course:  Susan Benton is a 51 y.o. G2P2  admitted from the office for unscheduled surgery.  She underwent the procedures as mentioned above, her operation was uncomplicated. For further details about surgery, please refer to the operative report. Patient had an uncomplicated postoperative course. By time of discharge on POD#1, her pain was controlled on oral pain medications; she was ambulating, voiding without difficulty, tolerating regular diet and passing flatus. She was deemed stable for discharge to home.   Significant Labs:    Latest Ref Rng & Units 10/30/2021    1:33 PM 07/20/2021    2:24 PM 06/04/2021    4:07 PM  CBC  WBC 4.0 - 10.5 K/uL 10.4  9.9  10.1   Hemoglobin 12.0 - 15.0 g/dL 12.0  12.2  12.3   Hematocrit 36.0 - 46.0 % 35.0  37.6  37.1   Platelets 150 - 400 K/uL 314  320  306     Discharge Exam: Blood pressure 106/60, pulse 76, temperature 98.2 F (36.8 C), resp. rate 18, height '5\' 9"'$  (1.753 m), weight 97.1 kg, last menstrual period 01/21/2017, SpO2 98 %. General appearance: alert and no distress  Resp: clear to auscultation bilaterally  Cardio: regular rate and rhythm  GI: soft, non-tender; bowel sounds normal; no masses, no organomegaly.  Incision: C/D/I, no erythema, no drainage noted Pelvic: scant blood on pad (done in presence of RN as chaperone)  Extremities: extremities normal, atraumatic, no cyanosis or edema and Homans sign is negative, no sign of DVT  Discharged Condition: Stable  Disposition: Discharge disposition: 01-Home or Self Care      Allergies as of 11/09/2021       Reactions   Tramadol Other  (See Comments)   Migraine headaches        Medication List     STOP taking these medications    ciprofloxacin 500 MG tablet Commonly known as: Cipro       TAKE these medications    ALPRAZolam 0.5 MG tablet Commonly known as: XANAX TAKE 1 TABLET BY MOUTH TWICE A DAY AS NEEDED FOR ANXIETY What changed: See the new instructions.   aspirin EC 81 MG tablet Take 81 mg by mouth daily.   atenolol 100 MG tablet Commonly known as: TENORMIN Take 1 tablet (100 mg total) by mouth daily. What changed: when to take this   cloNIDine 0.1 MG tablet Commonly known as: CATAPRES Take 1 tablet (0.1 mg total) by mouth daily.   clopidogrel 75 MG tablet Commonly known as: PLAVIX Take 75 mg by mouth daily.   docusate sodium 100 MG capsule Commonly known as: COLACE Take 1 capsule (100 mg total) by mouth 2 (two) times daily as needed.   gabapentin 300 MG capsule Commonly known as: NEURONTIN TAKE ONE CAPSULE BY MOUTH 3 TIMES A DAY What changed:  when to take this reasons to take this   HYDROmorphone 2 MG tablet Commonly known as: Dilaudid Take 1 tablet (2 mg total) by mouth every 4 (four) hours as needed for up to 5 days for severe pain (may take up to 4 mg for breakthrough pain q 4-6 hours).   ibuprofen 600 MG tablet Commonly known as: ADVIL Take 1 tablet (600 mg total)  by mouth every 6 (six) hours as needed.   methocarbamol 500 MG tablet Commonly known as: ROBAXIN Take 1 tablet (500 mg total) by mouth at bedtime as needed for muscle spasms.   metroNIDAZOLE 500 MG tablet Commonly known as: Flagyl Take 1 tablet (500 mg total) by mouth 3 (three) times daily for 14 days.   olmesartan 40 MG tablet Commonly known as: BENICAR Take 40 mg by mouth daily.   ondansetron 4 MG disintegrating tablet Commonly known as: ZOFRAN-ODT Take 1 tablet (4 mg total) by mouth every 6 (six) hours as needed for nausea.   oxyCODONE 15 MG immediate release tablet Commonly known as:  ROXICODONE Take 15 mg by mouth every 4 (four) hours as needed for pain.   Premarin vaginal cream Generic drug: conjugated estrogens Place 1 Applicatorful vaginally daily.   rOPINIRole 2 MG tablet Commonly known as: Requip 1-2 tabs po qhs What changed:  how much to take how to take this when to take this reasons to take this additional instructions   simethicone 80 MG chewable tablet Commonly known as: Gas-X Chew 1 tablet (80 mg total) by mouth 4 (four) times daily as needed for flatulence.   solifenacin 10 MG tablet Commonly known as: VESICARE Take 10 mg by mouth every morning.   tamoxifen 20 MG tablet Commonly known as: NOLVADEX TAKE 1 TABLET BY MOUTH EVERYDAY AT BEDTIME What changed:  how much to take how to take this when to take this additional instructions       Future Appointments  Date Time Provider [place]  11/13/2021  4:00 PM [facility] 1 [facility] [place]  11/16/2021  9:00 AM Truitt Merle, MD [facility] None  12/10/2021 10:00 AM Loralyn Freshwater M [facility] None  12/10/2021 11:15 AM [facility] None  11/01/2022  9:30 AM [facility] None  11/01/2022 10:00 AM Truitt Merle, MD [facility] None    Follow-up Information     Rubie Maid, MD Follow up in 2 week(s).   Specialties: Obstetrics and Gynecology, Radiology Why: For post-op check Contact information: [address] [address] [phone]                 Total discharge time: 15 minutes   Signed:   Rubie Maid, MD [facility]

## 2021-11-12 ENCOUNTER — Encounter: Payer: Self-pay | Admitting: Physician Assistant

## 2021-11-12 ENCOUNTER — Telehealth (HOSPITAL_COMMUNITY): Payer: Self-pay | Admitting: Physician Assistant

## 2021-11-12 ENCOUNTER — Other Ambulatory Visit: Payer: Self-pay | Admitting: *Deleted

## 2021-11-12 DIAGNOSIS — K769 Liver disease, unspecified: Secondary | ICD-10-CM

## 2021-11-13 ENCOUNTER — Ambulatory Visit (INDEPENDENT_AMBULATORY_CARE_PROVIDER_SITE_OTHER): Payer: Managed Care, Other (non HMO) | Admitting: Obstetrics

## 2021-11-13 ENCOUNTER — Encounter: Payer: Self-pay | Admitting: Obstetrics

## 2021-11-13 ENCOUNTER — Telehealth: Payer: Self-pay | Admitting: Hematology

## 2021-11-13 ENCOUNTER — Other Ambulatory Visit (HOSPITAL_COMMUNITY): Payer: Managed Care, Other (non HMO)

## 2021-11-13 VITALS — BP 134/82 | Resp 16 | Wt 215.4 lb

## 2021-11-13 DIAGNOSIS — R1084 Generalized abdominal pain: Secondary | ICD-10-CM

## 2021-11-13 DIAGNOSIS — G8918 Other acute postprocedural pain: Secondary | ICD-10-CM | POA: Diagnosis not present

## 2021-11-13 DIAGNOSIS — N369 Urethral disorder, unspecified: Secondary | ICD-10-CM

## 2021-11-13 LAB — POCT URINALYSIS DIPSTICK
Bilirubin, UA: NEGATIVE
Glucose, UA: NEGATIVE
Ketones, UA: NEGATIVE
Leukocytes, UA: NEGATIVE
Nitrite, UA: NEGATIVE
Protein, UA: NEGATIVE
Spec Grav, UA: 1.01 (ref 1.010–1.025)
Urobilinogen, UA: 0.2 E.U./dL
pH, UA: 7.5 (ref 5.0–8.0)

## 2021-11-13 MED ORDER — HYDROMORPHONE HCL 2 MG PO TABS: 2.0000 mg | ORAL_TABLET | ORAL | 0 refills | Status: AC | PRN

## 2021-11-13 NOTE — Patient Instructions (Signed)
Have a great year! Please call with any concerns. Don't forget to wear your seatbelt everyday! If you are not signed up on MyChart, please ask [place] how to sign up for it!   In a world where you can be anything, please be kind.   Body mass index is 31.81 kg/m.  A Healthy Lifestyle: Care Instructions Your Care Instructions  A healthy lifestyle can help you feel good, stay at a healthy weight, and have plenty of energy for both work and play. A healthy lifestyle is something you can share with your whole family. A healthy lifestyle also can lower your risk for serious health problems, such as high blood pressure, heart disease, and diabetes. You can follow a few steps listed below to improve your health and the health of your family. Follow-up care is a key part of your treatment and safety. Be sure to make and go to all appointments, and call your doctor if you are having problems. It's also a good idea to know your test results and keep a list of the medicines you take. How can you care for yourself at home? Do not eat too much sugar, fat, or fast foods. You can still have dessert and treats now and then. The goal is moderation. Start small to improve your eating habits. Pay attention to portion sizes, drink less juice and soda pop, and eat more fruits and vegetables. Eat a healthy amount of food. A 3-ounce serving of meat, for example, is about the size of a deck of cards. Fill the rest of your plate with vegetables and whole grains. Limit the amount of soda and sports drinks you have every day. Drink more water when you are thirsty. Eat at least 5 servings of fruits and vegetables every day. It may seem like a lot, but it is not hard to reach this goal. A serving or helping is 1 piece of fruit, 1 cup of vegetables, or 2 cups of leafy, raw vegetables. Have an apple or some carrot sticks as an afternoon snack instead of a candy bar. Try to have fruits and/or vegetables at every meal. Make exercise  part of your daily routine. You may want to start with simple activities, such as walking, bicycling, or slow swimming. Try to be active 30 to 60 minutes every day. You do not need to do all 30 to 60 minutes all at once. For example, you can exercise 3 times a day for 10 or 20 minutes. Moderate exercise is safe for most people, but it is always a good idea to talk to your doctor before starting an exercise program. Keep moving. Mow the lawn, work in the garden, or [employer]. Take the stairs instead of the elevator at work. If you smoke, quit. People who smoke have an increased risk for heart attack, stroke, cancer, and other lung illnesses. Quitting is hard, but there are ways to boost your chance of quitting tobacco for good. Use nicotine gum, patches, or lozenges. Ask your doctor about stop-smoking programs and medicines. Keep trying. In addition to reducing your risk of diseases in the future, you will notice some benefits soon after you stop using tobacco. If you have shortness of breath or asthma symptoms, they will likely get better within a few weeks after you quit. Limit how much alcohol you drink. Moderate amounts of alcohol (up to 2 drinks a day for men, 1 drink a day for women) are okay. But drinking too much can lead to  liver problems, high blood pressure, and other health problems. Family health If you have a family, there are many things you can do together to improve your health. Eat meals together as a family as often as possible. Eat healthy foods. This includes fruits, vegetables, lean meats and dairy, and whole grains. Include your family in your fitness plan. Most people think of activities such as jogging or tennis as the way to fitness, but there are many ways you and your family can be more active. Anything that makes you breathe hard and gets your heart pumping is exercise. Here are some tips: Walk to do errands or to take your child to school or the bus. Go for a family  bike ride after dinner instead of watching TV. Care instructions adapted under license by your healthcare professional. This care instruction is for use with your licensed healthcare professional. If you have questions about a medical condition or this instruction, always ask your healthcare professional. [place] any warranty or liability for your use of this information.

## 2021-11-13 NOTE — Telephone Encounter (Signed)
Rescheduled upcoming appointment per 6/20 secure chat. Patient is aware of changes.

## 2021-11-13 NOTE — Progress Notes (Signed)
Chief Complaint  Patient presents with   Post-op Follow-up    Patient reports that she had a hysterectomy on 07/26/21 and states that she had emergency surgery a week ago because her intestines were pushing through and states today that she is having vaginal spotting and rectal/vaginal pressure. " It feels like my intestines are going to fly out."   Surgery: REPAIR VAGINAL CUFF LAPAROSCOPY DIAGNOSTIC Procedure Note Date: 11/08/21  Susan Benton is a 51 y.o. who presents to the office 5 days post op.  She reports pain and pressure that has started yesterday.  The pressure feels like it did prior to surgery. She is concerned the cuff may have opened again. She ha had some spotting. She is ambulating without difficulty.  She is tolerating a regular diet without difficulty. Bowel movements are normal for her she normally goes 2x per week.  In addition, she reports she is out of her dilaudid. She was given 20 pills of Dilaudid 2 mg to take q4 hours. Thus she had enough for 3 days. She requests refill.  Patient has had a difficult time for 5 years since her cancer diagnosis. She has had 17 surgeries since 2018. She was due to come off her tamoxifen in October but with new lesion noted on CT C/A/P oncology has decided to continue tamoxifen x 5 addl years. A 2 mm left pulmonary nodule was seen. A 2.4 cm probable focal fatty deposit was seen in the liver with recommendation for FU MRI abdomen.   The following portions of the patient's history were reviewed and updated as appropriate: allergies, current medications, past family history, past medical history, past social history, past surgical history and problem list.  Objective: BP 134/82   Resp 16   Wt 215 lb 6.4 oz (97.7 kg)   LMP 01/21/2017   BMI 31.81 kg/m  Physical Exam Vitals and nursing note reviewed. Exam conducted with a chaperone present.  Constitutional:      Appearance: Normal appearance.  HENT:     Head: Normocephalic and atraumatic.   Eyes:     Extraocular Movements: Extraocular movements intact.  Pulmonary:     Effort: Pulmonary effort is normal.  Abdominal:    Genitourinary:    Exam position: Lithotomy position.     Labia:        Right: No tenderness or lesion.        Left: No tenderness or lesion.      Urethra: Urethral lesion present.          Comments: There is a small 1 mm ulcerated lesion to left of urethra; non tender. Urethra looks overall bruised c/w probable recent catheter ; cuff appears and feels intact; suture line visible with white discharge at suture. Exam limited by patient discomfort with exam and my using minimal pressure with speculum given recent surgery  Musculoskeletal:        General: Normal range of motion.     Cervical back: Normal range of motion.  Skin:    General: Skin is warm and dry.  Neurological:     General: No focal deficit present.     Mental Status: She is alert and oriented to person, place, and time.  Psychiatric:        Mood and Affect: Mood normal.        Behavior: Behavior normal.        Thought Content: Thought content normal.      Assessment/Plan:  Post-op pain - Plan: HYDROmorphone (DILAUDID) 2  MG tablet, POCT urinalysis dipstick  Urethral lesion - Plan: Herpes simplex virus culture  Generalized abdominal pain - Plan: MR PELVIS W WO CONTRAST, CANCELED: MR MRV PELVIS W WO CONTRAST  Op notes reviewed. Cuff appears intact today Susan Benton is scheduled for MRI abdomen, we will add pelvis to this to rule out any abscess.  For lesion will check HSV culture. Prior cx are still pending Pt is on daily bactrim for UTI proph and is on flagyl post op per Dr Marcelline Mates UA neg today  Refill done for 20 pills dilaudid which would last 3 days.  She is to contact her pain provider to let them know about the new meds; may need adjustment with her regimen  Return for post op Dr Marcelline Mates .  A total of 48 minutes were spent for the patient inclusive of face-to-face time  during visit, preparation, review, communication, and documentation during this encounter.

## 2021-11-15 LAB — NUSWAB VAGINITIS PLUS (VG+)
BVAB 2: HIGH Score — AB
Candida albicans, NAA: NEGATIVE
Candida glabrata, NAA: NEGATIVE
Chlamydia trachomatis, NAA: NEGATIVE
Megasphaera 1: HIGH Score — AB
Neisseria gonorrhoeae, NAA: NEGATIVE
Trich vag by NAA: NEGATIVE

## 2021-11-15 LAB — WOUND CULTURE

## 2021-11-15 LAB — HERPES SIMPLEX VIRUS CULTURE

## 2021-11-16 ENCOUNTER — Inpatient Hospital Stay: Payer: Managed Care, Other (non HMO) | Admitting: Hematology

## 2021-11-18 ENCOUNTER — Ambulatory Visit
Admission: RE | Admit: 2021-11-18 | Discharge: 2021-11-18 | Disposition: A | Discharge status: Home / Self Care | Payer: Managed Care, Other (non HMO) | Source: Ambulatory Visit | Attending: Hematology | Admitting: Hematology

## 2021-11-18 DIAGNOSIS — K769 Liver disease, unspecified: Secondary | ICD-10-CM

## 2021-11-18 MED ORDER — GADOBENATE DIMEGLUMINE 529 MG/ML IV SOLN
20.0000 mL | Freq: Once | INTRAVENOUS | Status: AC | PRN
Start: 2021-11-18 — End: 2021-11-18
  Administered 2021-11-18: 20 mL via INTRAVENOUS

## 2021-11-19 ENCOUNTER — Encounter: Payer: Self-pay | Admitting: Hematology

## 2021-11-19 ENCOUNTER — Inpatient Hospital Stay (HOSPITAL_BASED_OUTPATIENT_CLINIC_OR_DEPARTMENT_OTHER): Payer: Managed Care, Other (non HMO) | Admitting: Hematology

## 2021-11-19 DIAGNOSIS — C50411 Malignant neoplasm of upper-outer quadrant of right female breast: Secondary | ICD-10-CM

## 2021-11-19 DIAGNOSIS — Z170 Estrogen receptor positive status [ER+]: Secondary | ICD-10-CM | POA: Diagnosis not present

## 2021-11-20 ENCOUNTER — Telehealth: Payer: Self-pay | Admitting: Hematology

## 2021-11-21 ENCOUNTER — Encounter: Payer: Self-pay | Admitting: Obstetrics and Gynecology

## 2021-11-21 ENCOUNTER — Ambulatory Visit (INDEPENDENT_AMBULATORY_CARE_PROVIDER_SITE_OTHER): Payer: Managed Care, Other (non HMO) | Admitting: Obstetrics and Gynecology

## 2021-11-21 VITALS — BP 138/86 | HR 71 | Ht 69.0 in | Wt 203.0 lb

## 2021-11-21 DIAGNOSIS — Z4889 Encounter for other specified surgical aftercare: Secondary | ICD-10-CM

## 2021-11-21 DIAGNOSIS — Z9229 Personal history of other drug therapy: Secondary | ICD-10-CM

## 2021-11-21 DIAGNOSIS — Z9889 Other specified postprocedural states: Secondary | ICD-10-CM

## 2021-11-21 NOTE — Progress Notes (Signed)
    OBSTETRICS/GYNECOLOGY POST-OPERATIVE CLINIC VISIT  Subjective:     Susan Benton is a 51 y.o.  G2P2 female who presents to the clinic 2 weeks status post repair of vaginal cuff and diagnostic laparoscopic for vaginal cuff dehiscence. Eating a regular diet without difficulty. Bowel movements are normal. The patient is not having any pain. She continues to feel lots of pressure. Using Premarin cream as recommended.  Notes that she has burning when using the cream.   States that her Oncologist will be transitioning her from Tamoxifen to a new medication.   The following portions of the patient's history were reviewed and updated as appropriate: allergies, current medications, past family history, past medical history, past social history, past surgical history, and problem list.  Review of Systems Pertinent items noted in HPI and remainder of comprehensive ROS otherwise negative.   Objective:   BP 138/86   Pulse 71   Ht '5\' 9"'$  (1.753 m)   Wt 203 lb (92.1 kg)   LMP 01/21/2017   BMI 29.98 kg/m  Body mass index is 29.98 kg/m.  General:  alert and no distress  Abdomen: soft, bowel sounds active, non-tender  Incision:   healing well, no drainage, no erythema, no hernia, no seroma, no swelling, no dehiscence, incision well approximated  Pelvis:   External genitalia normal, urethra with small abrasion noted. Vagina without discharge or lesions, vaginal cuff healing well, intact, sutures present.     Pathology:   None  Assessment:   Patient s/p Repair vaginal cuff; laparoscopic diagnostic  History of breast cancer, on Tamoxifen Doing well postoperatively.   Plan:   1. Continue any current medications as instructed by provider. 2. Wound care discussed. 3. Operative findings again reviewed. Pathology report discussed. 4. Activity restrictions: no bending, stooping, or squatting, no lifting more than 10 pounds, and pelvic rest 5. Anticipated return to work: 4 weeks. 6. Follow  up: 4 weeks for final post-op visit.   Rubie Maid, MD [facility]

## 2021-12-03 ENCOUNTER — Ambulatory Visit: Payer: Managed Care, Other (non HMO) | Admitting: Hematology

## 2021-12-03 ENCOUNTER — Other Ambulatory Visit: Payer: Managed Care, Other (non HMO)

## 2021-12-10 ENCOUNTER — Inpatient Hospital Stay: Payer: Managed Care, Other (non HMO)

## 2021-12-10 ENCOUNTER — Inpatient Hospital Stay: Payer: Managed Care, Other (non HMO) | Attending: Hematology | Admitting: Genetic Counselor

## 2021-12-10 NOTE — Progress Notes (Deleted)
REFERRING PROVIDER: ***  PRIMARY PROVIDER:  Celene Squibb, MD  PRIMARY REASON FOR VISIT:  No diagnosis found.   HISTORY OF PRESENT ILLNESS:   Ms. Semple, a 51 y.o. female, was seen for a [facility] cancer genetics consultation at the request of Dede Query, PA-C due to a personal history of breast cancer.  Ms. Mcquitty presents to clinic today to discuss the possibility of a hereditary predisposition to cancer, to discuss genetic testing, and to further clarify her future cancer risks, as well as potential cancer risks for family members.   In 2018, at the age of 55, Ms. 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   CANCER HISTORY:  Oncology History  Malignant neoplasm of upper-outer quadrant of right female breast [facility])   Initial Diagnosis   Malignant neoplasm of upper-outer quadrant of right female breast ([place])   10/08/2016 Initial Biopsy   (R) breast needle biopsy (10:00): Invasive ductal carcinoma, grade 1-2, with DCIS.  ER+ (100%), PR+ (100%), Ki67 10%, HER2 neg (ratio 1.03).    10/23/2016 Surgery   Bilateral mastectomies [facility])   10/23/2016 Pathology Results   RIGHT mastectomy: Multifocal IDC, grade 1, spanning 4.6 cm & 0.6 cm. Low grade DCIS. Negative margins. HER2 repeated and neg (ratio 1.36).  LEFT mastectomy: Fibroadenoma, no malignancy.    11/20/2016 Surgery   (R) axillary sentinel lymph node biopsy [facility])   11/20/2016 Pathology Results   0/1 axillary sentinel LN biopsy for carcinoma.    11/28/2016 Oncotype testing   Recurrence score: 9 (low risk); associated with 6% risk of distant recurrence.    12/03/2016 Imaging   CT chest: IMPRESSION: 1. 7.7 cm lobulated fluid attenuation collection in the right axilla, could represent a postoperative fluid collection, with or without presence of infection. Further management will need to be based on clinical grounds. Could correlate with targeted  sonography and aspiration as indicated. 2. Clear lung fields 3. Hepatic steatosis 4. Aortic Atherosclerosis   12/13/2016 Miscellaneous   BCI genomic testing revealed LOW risk of recurrence with 4.3% risk and LOW likelihood of benefit of extension of therapy beyond 5 years.        RISK FACTORS:  Colonoscopy: {Yes/No-Ex:120004}; {normal/abnormal/not examined:14677}. Hysterectomy: yes in March 2023 Ovaries intact: no; BSO in March 2023.  Menarche was at age ***.  First live birth at age ***.  Menopausal status: {Menopause:31378}.  OCP use for approximately {Numbers 1-12 multi-select:20307} years.  HRT use: {Numbers 1-12 multi-select:20307} years. 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[facility])    after taking tamoxifan-on plavix and asa   PONV (postoperative nausea and vomiting)    Restless legs syndrome     Past Surgical History:  Procedure Laterality Date   AXILLARY SENTINEL NODE BIOPSY Right 11/20/2016   Procedure: AXILLARY SENTINEL NODE BIOPSY;  Surgeon: Aviva Signs, MD;  Location[facility];  Service: General;  Laterality: Right;  Sentinel Node Injection @ 10:30am   BLADDER REPAIR  2022   BREAST RECONSTRUCTION WITH PLACEMENT OF TISSUE EXPANDER AND FLEX HD (ACELLULAR HYDRATED DERMIS) Bilateral 12/25/2016   Procedure: BREAST RECONSTRUCTION WITH PLACEMENT OF TISSUE EXPANDER AND FLEX HD (ACELLULAR HYDRATED DERMIS);  Surgeon: Wallace Going, DO;  Location: [facility];  Service: Plastics;  Laterality:  Bilateral;   CARDIAC CATHETERIZATION     CYSTOSCOPY  07/26/2021   Procedure: CYSTOSCOPY;  Surgeon: Gae Dry, MD;  Location: [facility];  Service: Gynecology;;   [place] N/A 10/23/2016   Procedure: DILATATION & CURETTAGE/HYSTEROSCOPY WITH NOVASURE ENDOMETRIAL ABLATION;  Surgeon: Florian Buff, MD;  Location: [facility];  Service: Gynecology;  Laterality: N/A;   KIDNEY CYST REMOVAL     LAPAROSCOPY N/A 11/08/2021   Procedure: LAPAROSCOPY DIAGNOSTIC;  Surgeon: Rubie Maid, MD;  Location: [facility];  Service: Gynecology;  Laterality: N/A;   MASTECTOMY MODIFIED RADICAL Right 10/23/2016   Procedure: MASTECTOMY MODIFIED RADICAL;  Surgeon: Aviva Signs, MD;  [facility];  Service: General;  Laterality: Right;   REPAIR VAGINAL CUFF N/A 11/08/2021   Procedure: REPAIR VAGINAL CUFF;  Surgeon: Rubie Maid, MD;  Location: [facility];  Service: Gynecology;  Laterality: N/A;   ROTATOR CUFF REPAIR     SIMPLE MASTECTOMY WITH AXILLARY SENTINEL NODE BIOPSY Left 10/23/2016   Procedure: SIMPLE MASTECTOMY;  Surgeon: Aviva Signs, MD;  [facility];  Service: General;  Laterality: Left;   TISSUE EXPANDER PLACEMENT Bilateral 02/27/2017   Procedure: REMOVAL TISSUE EXPANDER;  Surgeon: Wallace Going, DO;  Location: [place];  Service: Plastics;  Laterality: Bilateral;   TONSILECTOMY, ADENOIDECTOMY, BILATERAL MYRINGOTOMY AND TUBES Bilateral 1984   TOTAL LAPAROSCOPIC HYSTERECTOMY WITH BILATERAL SALPINGO OOPHORECTOMY Bilateral 07/26/2021   Procedure: TOTAL LAPAROSCOPIC HYSTERECTOMY WITH BILATERAL SALPINGO OOPHORECTOMY;  Surgeon: Gae Dry, MD;  Location: [facility];  Service: Gynecology;  Laterality: Bilateral;   TUBAL LIGATION  1998   2000    FAMILY HISTORY:  We obtained a detailed, 4-generation family history.  Significant diagnoses are listed below: Family History  Problem Relation Age of Onset   Hypertension Mother    Cancer Mother        skin  cancer   Diabetes Father    Hypertension Father    Kidney disease Father    Hyperlipidemia Father    Asthma Father    COPD Father    Heart disease Father    HIV/AIDS Brother    Cancer Maternal Grandmother        breast   Breast cancer Maternal Grandmother    Hypertension Son    Breast cancer Other        cousin had breast cancer    Ms. Nicoll is {aware/unaware} of previous family history of genetic testing for hereditary cancer risks. Patient's maternal ancestors are of *** descent, and paternal ancestors are of *** descent. There {IS NO:12509} reported Ashkenazi Jewish ancestry. There {IS NO:12509} known consanguinity.  GENETIC COUNSELING ASSESSMENT: Ms. Kazlauskas is a 51 y.o. female with a {Personal/family:20331} history of {cancer/polyps} which is somewhat suggestive of a {DISEASE} and predisposition to cancer given ***. We, therefore, discussed and recommended the following at today's visit.   DISCUSSION: We discussed that *** - ***% of *** is hereditary.  Most cases of *** associated with ***.  There are other genes that can be associated with hereditary *** cancer syndromes.  These include ***.  We discussed that testing is beneficial for several reasons including knowing how to follow individuals for their cancer risks,  identifying whether potential treatment options *** would be beneficial, and understanding if other family members could be at risk for cancer and allowing them to undergo genetic testing.   We reviewed the characteristics, features and inheritance patterns of hereditary cancer syndromes. We also discussed genetic testing, including the appropriate family members to test, the process of testing, insurance coverage and turn-around-time for results. We discussed the implications of a negative, positive, carrier and/or variant of uncertain significant result. We recommended Ms. Yellin pursue genetic testing for a panel that includes genes associated with *** cancer.    Ms. Trafton  was offered a common hereditary cancer panel (47 genes) and an expanded pan-cancer panel (77 genes). Ms. Guzzetta was informed of the benefits and limitations of each panel, including that expanded pan-cancer panels contain genes that do not have clear management guidelines at this point in time.  We also discussed that as the number of genes included on a panel increases, the chances of variants of uncertain significance increases.  After considering the benefits and limitations of each gene panel, Ms. Weber  elected to have *** through ***.   Based on Ms. Vanterpool's {Personal/family:20331} history of cancer, she meets medical criteria for genetic testing. Despite that she meets criteria, she may still have an out of pocket cost. We discussed that if her out of pocket cost for testing is over $100, the laboratory should contact her and discuss the self-pay prices and/or patient pay assistance programs.    ***We reviewed the characteristics, features and inheritance patterns of hereditary cancer syndromes. We also discussed genetic testing, including the appropriate family members to test, the process of testing, insurance coverage and turn-around-time for results. We discussed the implications of a negative, positive and/or variant of uncertain significant result. In order to get genetic test results in a timely manner so that Ms. Hoaglin can use these genetic test results for surgical decisions, we recommended Ms. Ferrer pursue genetic testing for the ***. Once complete, we recommend Ms. Noorani pursue reflex genetic testing to the *** gene panel.   Based on Ms. Borden's {Personal/family:20331} history of cancer, she meets medical criteria for genetic testing. Despite that she meets criteria, she may still have an out of pocket cost.   ***We discussed with Ms. Segundo that the {Personal/family:20331} history does not meet insurance or NCCN criteria for genetic testing  and, therefore, is not highly consistent with a familial hereditary cancer syndrome.  We feel she is at low risk to harbor a gene mutation associated with such a condition. Thus, we did not recommend any genetic testing, at this time, and recommended Ms. Hribar continue to follow the cancer screening guidelines given by her primary healthcare provider.  ***In order to estimate her chance of having a {CA GENE:62345} mutation, we used statistical models ({GENMODELS:62370}) that consider her personal medical history, family history and ancestry.  Because each model is different, there can be a lot of variability in the risks they give.  Therefore, these numbers must be considered a rough range and not a precise risk of having a {CA GENE:62345} mutation.  These models estimate that she has approximately a ***-***% chance of having a mutation. Based on this assessment of her family and personal history, genetic testing {IS/ISNOT:34056} recommended.  ***Based on the patient's {Personal/family:20331} history, a statistical model ({GENMODELS:62370}) was used to estimate her risk of developing {CA HX:54794}. This estimates her lifetime risk of developing {CA HX:54794} to be approximately ***%. This estimation does not consider any genetic  testing results.  The patient's lifetime breast cancer risk is a preliminary estimate based on available information using one of several models endorsed by the [place] (ACS). The ACS recommends consideration of breast MRI screening as an adjunct to mammography for patients at high risk (defined as 20% or greater lifetime risk).   ***Ms. Bottenfield has been determined to be at high risk for breast cancer.  Therefore, we recommend that annual screening with mammography and breast MRI be performed.  ***begin at age 33, or 10 years prior to the age of breast cancer diagnosis in a relative (whichever is earlier).  We discussed that Ms. Grist should discuss her  individual situation with her referring physician and determine a breast cancer screening plan with which they are both comfortable.    PLAN: After considering the risks, benefits, and limitations, Ms. Fabry provided informed consent to pursue genetic testing and the blood sample was sent to {Lab} Laboratories for analysis of the {test}. Results should be available within approximately {TAT TIME} weeks' time, at which point they will be disclosed by telephone to Ms. Drumheller, as will any additional recommendations warranted by these results. Ms. Bogie will receive a summary of her genetic counseling visit and a copy of her results once available. This information will also be available in Epic.   *** Despite our recommendation, Ms. Duhart did not wish to pursue genetic testing at today's visit. We understand this decision and remain available to coordinate genetic testing at any time in the future. We, therefore, recommend Ms. Valek continue to follow the cancer screening guidelines given by her primary healthcare provider.  ***Based on Ms. Gelin's family history, we recommended her ***, who was diagnosed with *** at age ***, have genetic counseling and testing. Ms. Berling will let us know if we can be of any assistance in coordinating genetic counseling and/or testing for this family member.   Lastly, we encouraged Ms. Winecoff to remain in contact with cancer genetics annually so that we can continuously update the family history and inform her of any changes in cancer genetics and testing that may be of benefit for this family.   Ms. Malmquist questions were answered to her satisfaction today. Our contact information was provided should additional questions or concerns arise. Thank you for the referral and allowing [place] to share in the care of your patient.   Berlynn Warsame M. Joette Catching, [place], [facility] Genetic Counselor [email or web address] (P) [phone]  The patient was seen for  a total of *** minutes in face-to-face genetic counseling.  ***The was accompanied by ***.  ***The patient was seen alone.  Drs. Lindi Adie and/or Burr Medico were available to discuss this case as needed.    _______________________________________________________________________ For Office Staff:  Number of people involved in session: *** Was an Intern/ student involved with case: {YES/NO:63}

## 2021-12-12 ENCOUNTER — Encounter: Payer: Managed Care, Other (non HMO) | Admitting: Obstetrics and Gynecology
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
## 2021-12-18 ENCOUNTER — Encounter (HOSPITAL_BASED_OUTPATIENT_CLINIC_OR_DEPARTMENT_OTHER): Payer: Self-pay | Admitting: Plastic Surgery

## 2021-12-18 ENCOUNTER — Telehealth: Payer: Self-pay | Admitting: Obstetrics and Gynecology

## 2021-12-18 NOTE — Telephone Encounter (Signed)
Please inform patient to take it easy for today, no strenuous activity.  Spotting can be normal during her second healing process but if it progresses to bright red blood then this would be more concerning. I will take a close look tomorrow.

## 2021-12-18 NOTE — Telephone Encounter (Signed)
Patient called and stated that she had a recent surgery on June 15th. Patient states that she has had an full hysterectomy. Last night around 10:30pm she started feeling pressure she went to the bathroom and when she used wiped herself she noticed blood light pink in color on her tissue. Pt states she is still having pressure between the vaginal and rectal area. Pt is concerned and wants to know if this is normal. Pt states the pressure she feels is similar to what she felt after surgery. Pt is scheduled tomorrow for her second post op appointment. Please advise.

## 2021-12-18 NOTE — Progress Notes (Unsigned)
    OBSTETRICS/GYNECOLOGY POST-OPERATIVE CLINIC VISIT  Subjective:     Susan Benton is a 51 y.o. female who presents to the clinic {1-10:13787} weeks status post {gyn surgeries:13997} for {gyn surg indications maj:13998}. Eating a regular diet {with-without:5700} difficulty. Bowel movements are {normal/abnormal***:19619}. {pain control:13522::"The patient is not having any pain."}  {Common ambulatory SmartLinks:19316}  Review of Systems {ros; complete:30496}   Objective:   LMP 01/21/2017  There is no height or weight on file to calculate BMI.  General:  alert and no distress  Abdomen: soft, bowel sounds active, non-tender  Incision:   {incision:13716::"no dehiscence","incision well approximated","healing well","no drainage","no erythema","no hernia","no seroma","no swelling"}    Pathology:    Assessment:   Patient s/p *** (surgery)  {doing well:13525::"Doing well postoperatively."}   Plan:   1. Continue any current medications as instructed by provider. 2. Wound care discussed. 3. Operative findings again reviewed. Pathology report discussed. 4. Activity restrictions: {restrictions:13723} 5. Anticipated return to work: {work return:14002}. 6. Follow up: {1-02:72536} {time; units:18646} for ***    Landis Gandy, CMA [facility]

## 2021-12-19 ENCOUNTER — Ambulatory Visit (INDEPENDENT_AMBULATORY_CARE_PROVIDER_SITE_OTHER): Payer: Managed Care, Other (non HMO) | Admitting: Obstetrics and Gynecology

## 2021-12-19 ENCOUNTER — Encounter: Payer: Self-pay | Admitting: Obstetrics and Gynecology

## 2021-12-19 VITALS — BP 148/90 | HR 83 | Resp 15 | Ht 69.0 in | Wt 197.7 lb

## 2021-12-19 DIAGNOSIS — Z4889 Encounter for other specified surgical aftercare: Secondary | ICD-10-CM

## 2021-12-19 DIAGNOSIS — Z9889 Other specified postprocedural states: Secondary | ICD-10-CM

## 2021-12-24 NOTE — Progress Notes (Signed)

## 2021-12-25 ENCOUNTER — Ambulatory Visit (HOSPITAL_BASED_OUTPATIENT_CLINIC_OR_DEPARTMENT_OTHER): Payer: Managed Care, Other (non HMO) | Admitting: Anesthesiology

## 2021-12-25 ENCOUNTER — Other Ambulatory Visit: Payer: Self-pay

## 2021-12-25 ENCOUNTER — Encounter (HOSPITAL_BASED_OUTPATIENT_CLINIC_OR_DEPARTMENT_OTHER)
Admission: RE | Disposition: A | Discharge status: Home / Self Care | Payer: Self-pay | Source: Home / Self Care | Attending: Plastic Surgery

## 2021-12-25 ENCOUNTER — Encounter (HOSPITAL_BASED_OUTPATIENT_CLINIC_OR_DEPARTMENT_OTHER): Payer: Self-pay | Admitting: Plastic Surgery

## 2021-12-25 ENCOUNTER — Ambulatory Visit (HOSPITAL_BASED_OUTPATIENT_CLINIC_OR_DEPARTMENT_OTHER)
Admission: RE | Admit: 2021-12-25 | Discharge: 2021-12-25 | Disposition: A | Discharge status: Home / Self Care | Payer: Managed Care, Other (non HMO) | Attending: Plastic Surgery | Admitting: Plastic Surgery

## 2021-12-25 DIAGNOSIS — Z7901 Long term (current) use of anticoagulants: Secondary | ICD-10-CM | POA: Insufficient documentation

## 2021-12-25 DIAGNOSIS — I82521 Chronic embolism and thrombosis of right iliac vein: Secondary | ICD-10-CM | POA: Diagnosis not present

## 2021-12-25 DIAGNOSIS — I700 Atherosclerosis of aorta: Secondary | ICD-10-CM | POA: Diagnosis not present

## 2021-12-25 DIAGNOSIS — R911 Solitary pulmonary nodule: Secondary | ICD-10-CM | POA: Insufficient documentation

## 2021-12-25 DIAGNOSIS — Z9013 Acquired absence of bilateral breasts and nipples: Secondary | ICD-10-CM | POA: Diagnosis not present

## 2021-12-25 DIAGNOSIS — Z79891 Long term (current) use of opiate analgesic: Secondary | ICD-10-CM | POA: Insufficient documentation

## 2021-12-25 DIAGNOSIS — Z87891 Personal history of nicotine dependence: Secondary | ICD-10-CM | POA: Diagnosis not present

## 2021-12-25 DIAGNOSIS — Z853 Personal history of malignant neoplasm of breast: Secondary | ICD-10-CM

## 2021-12-25 DIAGNOSIS — Z87442 Personal history of urinary calculi: Secondary | ICD-10-CM | POA: Diagnosis not present

## 2021-12-25 DIAGNOSIS — K5900 Constipation, unspecified: Secondary | ICD-10-CM | POA: Diagnosis not present

## 2021-12-25 DIAGNOSIS — Z421 Encounter for breast reconstruction following mastectomy: Secondary | ICD-10-CM | POA: Diagnosis not present

## 2021-12-25 DIAGNOSIS — B182 Chronic viral hepatitis C: Secondary | ICD-10-CM | POA: Insufficient documentation

## 2021-12-25 DIAGNOSIS — Z90722 Acquired absence of ovaries, bilateral: Secondary | ICD-10-CM | POA: Diagnosis not present

## 2021-12-25 HISTORY — PX: BREAST RECONSTRUCTION WITH PLACEMENT OF TISSUE EXPANDER AND ALLODERM: SHX6805

## 2021-12-25 SURGERY — BREAST RECONSTRUCTION WITH PLACEMENT OF TISSUE EXPANDER AND ALLODERM
Anesthesia: General | Site: Chest | Laterality: Bilateral

## 2021-12-25 MED ORDER — PROPOFOL 10 MG/ML IV BOLUS
INTRAVENOUS | Status: DC | PRN
  Administered 2021-12-25: 200 mg via INTRAVENOUS

## 2021-12-25 MED ORDER — FENTANYL CITRATE (PF) 100 MCG/2ML IJ SOLN
INTRAMUSCULAR | Status: DC | PRN
Start: 2021-12-25 — End: 2021-12-25
  Administered 2021-12-25 (×4): 25 ug via INTRAVENOUS
  Administered 2021-12-25 (×2): 50 ug via INTRAVENOUS

## 2021-12-25 MED ORDER — CEFAZOLIN SODIUM-DEXTROSE 2-4 GM/100ML-% IV SOLN
2.0000 g | INTRAVENOUS | Status: AC
Start: 2021-12-25 — End: 2021-12-25
  Administered 2021-12-25: 2 g via INTRAVENOUS

## 2021-12-25 MED ORDER — PHENYLEPHRINE HCL-NACL 20-0.9 MG/250ML-% IV SOLN
INTRAVENOUS | Status: DC | PRN
  Administered 2021-12-25: 40 ug/min via INTRAVENOUS

## 2021-12-25 MED ORDER — CELECOXIB 200 MG PO CAPS
200.0000 mg | ORAL_CAPSULE | ORAL | Status: AC
Start: 2021-12-25 — End: 2021-12-25
  Administered 2021-12-25: 200 mg via ORAL

## 2021-12-25 MED ORDER — CHLORHEXIDINE GLUCONATE CLOTH 2 % EX PADS: 6.0000 | MEDICATED_PAD | Freq: Once | CUTANEOUS | Status: DC

## 2021-12-25 MED ORDER — PHENYLEPHRINE HCL (PRESSORS) 10 MG/ML IV SOLN
INTRAVENOUS | Status: AC
  Filled 2021-12-25: qty 1

## 2021-12-25 MED ORDER — SODIUM CHLORIDE 0.9 % IV SOLN
INTRAVENOUS | Status: DC | PRN
  Administered 2021-12-25: 500 mL

## 2021-12-25 MED ORDER — HYDROMORPHONE HCL 1 MG/ML IJ SOLN
INTRAMUSCULAR | Status: AC
  Filled 2021-12-25: qty 0.5

## 2021-12-25 MED ORDER — ONDANSETRON HCL 4 MG/2ML IJ SOLN: 4.0000 mg | Freq: Once | INTRAMUSCULAR | Status: DC | PRN

## 2021-12-25 MED ORDER — PHENYLEPHRINE HCL (PRESSORS) 10 MG/ML IV SOLN
INTRAVENOUS | Status: DC | PRN
  Administered 2021-12-25 (×2): 160 ug via INTRAVENOUS

## 2021-12-25 MED ORDER — OXYCODONE HCL 5 MG PO TABS
ORAL_TABLET | ORAL | Status: AC
  Filled 2021-12-25: qty 1

## 2021-12-25 MED ORDER — ONDANSETRON HCL 4 MG/2ML IJ SOLN
INTRAMUSCULAR | Status: DC | PRN
  Administered 2021-12-25: 4 mg via INTRAVENOUS

## 2021-12-25 MED ORDER — LACTATED RINGERS IV SOLN: INTRAVENOUS | Status: DC

## 2021-12-25 MED ORDER — OXYCODONE HCL 5 MG/5ML PO SOLN: 5.0000 mg | Freq: Once | ORAL | Status: AC | PRN

## 2021-12-25 MED ORDER — LIDOCAINE HCL (CARDIAC) PF 100 MG/5ML IV SOSY
PREFILLED_SYRINGE | INTRAVENOUS | Status: DC | PRN
  Administered 2021-12-25: 100 mg via INTRAVENOUS

## 2021-12-25 MED ORDER — ACETAMINOPHEN 500 MG PO TABS
1000.0000 mg | ORAL_TABLET | ORAL | Status: AC
  Administered 2021-12-25: 1000 mg via ORAL

## 2021-12-25 MED ORDER — FENTANYL CITRATE (PF) 100 MCG/2ML IJ SOLN
INTRAMUSCULAR | Status: AC
  Filled 2021-12-25: qty 2

## 2021-12-25 MED ORDER — MIDAZOLAM HCL 5 MG/5ML IJ SOLN
INTRAMUSCULAR | Status: DC | PRN
  Administered 2021-12-25: 2 mg via INTRAVENOUS

## 2021-12-25 MED ORDER — GABAPENTIN 300 MG PO CAPS
300.0000 mg | ORAL_CAPSULE | ORAL | Status: AC
Start: 2021-12-25 — End: 2021-12-25
  Administered 2021-12-25: 300 mg via ORAL

## 2021-12-25 MED ORDER — SODIUM CHLORIDE 0.9 % IV SOLN
INTRAVENOUS | Status: AC
  Filled 2021-12-25: qty 10

## 2021-12-25 MED ORDER — HYDROMORPHONE HCL 1 MG/ML IJ SOLN
0.2500 mg | INTRAMUSCULAR | Status: DC | PRN
  Administered 2021-12-25 (×4): 0.5 mg via INTRAVENOUS

## 2021-12-25 MED ORDER — OXYCODONE HCL 5 MG PO TABS
5.0000 mg | ORAL_TABLET | Freq: Once | ORAL | Status: AC | PRN
  Administered 2021-12-25: 5 mg via ORAL

## 2021-12-25 MED ORDER — MIDAZOLAM HCL 2 MG/2ML IJ SOLN
INTRAMUSCULAR | Status: AC
  Filled 2021-12-25: qty 2

## 2021-12-25 MED ORDER — DEXAMETHASONE SODIUM PHOSPHATE 4 MG/ML IJ SOLN
INTRAMUSCULAR | Status: DC | PRN
  Administered 2021-12-25: 10 mg via INTRAVENOUS

## 2021-12-25 MED ORDER — CEFAZOLIN SODIUM-DEXTROSE 2-4 GM/100ML-% IV SOLN
INTRAVENOUS | Status: AC
  Filled 2021-12-25: qty 100

## 2021-12-25 MED ORDER — EPHEDRINE SULFATE (PRESSORS) 50 MG/ML IJ SOLN
INTRAMUSCULAR | Status: DC | PRN
  Administered 2021-12-25 (×2): 10 mg via INTRAVENOUS

## 2021-12-25 MED ORDER — AMISULPRIDE (ANTIEMETIC) 5 MG/2ML IV SOLN: 10.0000 mg | Freq: Once | INTRAVENOUS | Status: DC | PRN

## 2021-12-25 MED ORDER — CELECOXIB 200 MG PO CAPS
ORAL_CAPSULE | ORAL | Status: AC
  Filled 2021-12-25: qty 1

## 2021-12-25 MED ORDER — GABAPENTIN 300 MG PO CAPS
ORAL_CAPSULE | ORAL | Status: AC
  Filled 2021-12-25: qty 1

## 2021-12-25 MED ORDER — SODIUM CHLORIDE (PF) 0.9 % IJ SOLN
INTRAMUSCULAR | Status: DC | PRN
  Administered 2021-12-25: 1000 mL

## 2021-12-25 MED ORDER — ACETAMINOPHEN 500 MG PO TABS
ORAL_TABLET | ORAL | Status: AC
  Filled 2021-12-25: qty 2

## 2021-12-25 MED ORDER — POVIDONE-IODINE 10 % EX SOLN
CUTANEOUS | Status: DC | PRN
  Administered 2021-12-25: 1 via TOPICAL

## 2021-12-25 SURGICAL SUPPLY — 70 items
ADH SKN CLS APL DERMABOND .7 (GAUZE/BANDAGES/DRESSINGS) ×1
ALLOGRAFT PERF 16X20 1.6+/-0.4 (Tissue) ×2 IMPLANT
APL PRP STRL LF DISP 70% ISPRP (MISCELLANEOUS) ×2
BAG DECANTER FOR FLEXI CONT (MISCELLANEOUS) ×2 IMPLANT
BINDER BREAST LRG (GAUZE/BANDAGES/DRESSINGS) IMPLANT
BINDER BREAST MEDIUM (GAUZE/BANDAGES/DRESSINGS) IMPLANT
BINDER BREAST XLRG (GAUZE/BANDAGES/DRESSINGS) ×1 IMPLANT
BINDER BREAST XXLRG (GAUZE/BANDAGES/DRESSINGS) IMPLANT
BLADE SURG 10 STRL SS (BLADE) ×2 IMPLANT
BLADE SURG 15 STRL LF DISP TIS (BLADE) ×1 IMPLANT
BLADE SURG 15 STRL SS (BLADE) ×2
BNDG GAUZE DERMACEA FLUFF (GAUZE/BANDAGES/DRESSINGS)
BNDG GAUZE DERMACEA FLUFF 4 (GAUZE/BANDAGES/DRESSINGS) IMPLANT
BNDG GZE DERMACEA 4 6PLY (GAUZE/BANDAGES/DRESSINGS)
CANISTER SUCT 1200ML W/VALVE (MISCELLANEOUS) ×2 IMPLANT
CHLORAPREP W/TINT 26 (MISCELLANEOUS) ×3 IMPLANT
COVER BACK TABLE 60X90IN (DRAPES) ×2 IMPLANT
COVER MAYO STAND STRL (DRAPES) ×2 IMPLANT
DERMABOND ADVANCED (GAUZE/BANDAGES/DRESSINGS) ×1
DERMABOND ADVANCED .7 DNX12 (GAUZE/BANDAGES/DRESSINGS) ×1 IMPLANT
DRAIN CHANNEL 15F RND FF W/TCR (WOUND CARE) IMPLANT
DRAIN CHANNEL 19F RND (DRAIN) ×2 IMPLANT
DRAPE INCISE IOBAN 66X45 STRL (DRAPES) IMPLANT
DRAPE TOP ARMCOVERS (MISCELLANEOUS) ×2 IMPLANT
DRAPE U-SHAPE 76X120 STRL (DRAPES) ×2 IMPLANT
DRAPE UTILITY XL STRL (DRAPES) ×2 IMPLANT
DRSG PAD ABDOMINAL 8X10 ST (GAUZE/BANDAGES/DRESSINGS) ×4 IMPLANT
DRSG TEGADERM 2-3/8X2-3/4 SM (GAUZE/BANDAGES/DRESSINGS) IMPLANT
DRSG TEGADERM 4X10 (GAUZE/BANDAGES/DRESSINGS) ×3 IMPLANT
ELECT BLADE 4.0 EZ CLEAN MEGAD (MISCELLANEOUS) ×2
ELECT COATED BLADE 2.86 ST (ELECTRODE) ×2 IMPLANT
ELECTRODE BLDE 4.0 EZ CLN MEGD (MISCELLANEOUS) ×1 IMPLANT
EVACUATOR SILICONE 100CC (DRAIN) ×4 IMPLANT
EXPANDER TISSUE FV FOURTE 500 (Prosthesis & Implant Plastic) IMPLANT
GLOVE BIO SURGEON STRL SZ 6 (GLOVE) ×2 IMPLANT
GOWN STRL REUS W/ TWL LRG LVL3 (GOWN DISPOSABLE) ×2 IMPLANT
GOWN STRL REUS W/TWL LRG LVL3 (GOWN DISPOSABLE) ×4
KIT FILL ASEPTIC TRANSFER (MISCELLANEOUS) ×1 IMPLANT
MARKER SKIN DUAL TIP RULER LAB (MISCELLANEOUS) ×1 IMPLANT
NDL HYPO 25X1 1.5 SAFETY (NEEDLE) IMPLANT
NDL SAFETY ECLIPSE 18X1.5 (NEEDLE) ×1 IMPLANT
NEEDLE HYPO 18GX1.5 SHARP (NEEDLE) ×2
NEEDLE HYPO 25X1 1.5 SAFETY (NEEDLE) IMPLANT
PENCIL SMOKE EVACUATOR (MISCELLANEOUS) ×2 IMPLANT
PIN SAFETY STERILE (MISCELLANEOUS) ×2 IMPLANT
PUNCH BIOPSY DERMAL 4MM (MISCELLANEOUS) IMPLANT
SHEET MEDIUM DRAPE 40X70 STRL (DRAPES) ×2 IMPLANT
SPONGE T-LAP 18X18 ~~LOC~~+RFID (SPONGE) ×4 IMPLANT
STAPLER VISISTAT 35W (STAPLE) ×2 IMPLANT
SUT CHROMIC 4 0 PS 2 18 (SUTURE) ×5 IMPLANT
SUT ETHILON 2 0 FS 18 (SUTURE) ×3 IMPLANT
SUT MNCRL AB 4-0 PS2 18 (SUTURE) ×3 IMPLANT
SUT PDS AB 2-0 CT2 27 (SUTURE) IMPLANT
SUT SILK 2 0 SH (SUTURE) IMPLANT
SUT VIC AB 3-0 SH 27 (SUTURE) ×4
SUT VIC AB 3-0 SH 27X BRD (SUTURE) IMPLANT
SUT VICRYL 0 CT-2 (SUTURE) ×2 IMPLANT
SUT VICRYL 4-0 PS2 18IN ABS (SUTURE) ×2 IMPLANT
SUT VLOC 180 0 24IN GS25 (SUTURE) ×2 IMPLANT
SYR 10ML LL (SYRINGE) ×2 IMPLANT
SYR 50ML LL SCALE MARK (SYRINGE) ×2 IMPLANT
SYR BULB IRRIG 60ML STRL (SYRINGE) ×2 IMPLANT
SYR CONTROL 10ML LL (SYRINGE) IMPLANT
TAPE MEASURE VINYL STERILE (MISCELLANEOUS) IMPLANT
TISSUE EXPNDR FV FOURTE 500 (Prosthesis & Implant Plastic) ×4 IMPLANT
TOWEL GREEN STERILE FF (TOWEL DISPOSABLE) ×2 IMPLANT
TRAY DSU PREP LF (CUSTOM PROCEDURE TRAY) IMPLANT
TUBE CONNECTING 20X1/4 (TUBING) IMPLANT
UNDERPAD 30X36 HEAVY ABSORB (UNDERPADS AND DIAPERS) ×4 IMPLANT
YANKAUER SUCT BULB TIP NO VENT (SUCTIONS) ×2 IMPLANT

## 2021-12-25 NOTE — Anesthesia Preprocedure Evaluation (Signed)
Anesthesia Evaluation  Patient identified by MRN, date of birth, ID band Patient awake    Reviewed: Allergy & Precautions, NPO status , Patient's Chart, lab work & pertinent test results  History of Anesthesia Complications (+) PONV and history of anesthetic complications  Airway Mallampati: II  TM Distance: >3 FB Neck ROM: Full    Dental  (+) Dental Advisory Given, Teeth Intact   Pulmonary neg pulmonary ROS, former smoker,    Pulmonary exam normal        Cardiovascular hypertension, Normal cardiovascular exam     Neuro/Psych Anxiety Depression negative neurological ROS     GI/Hepatic negative GI ROS, (+) Hepatitis -, C  Endo/Other  negative endocrine ROS  Renal/GU negative Renal ROS  negative genitourinary   Musculoskeletal  (+) Arthritis ,   Abdominal   Peds  Hematology negative hematology ROS (+)   Anesthesia Other Findings H/o breast cancer  Reproductive/Obstetrics                             Anesthesia Physical Anesthesia Plan  ASA: 2  Anesthesia Plan: General   Post-op Pain Management: Tylenol PO (pre-op)* and Celebrex PO (pre-op)*   Induction: Intravenous  PONV Risk Score and Plan: 4 or greater and Ondansetron, Dexamethasone, Midazolam and Treatment may vary due to age or medical condition  Airway Management Planned: LMA  Additional Equipment: None  Intra-op Plan:   Post-operative Plan: Extubation in OR  Informed Consent: I have reviewed the patients History and Physical, chart, labs and discussed the procedure including the risks, benefits and alternatives for the proposed anesthesia with the patient or authorized representative who has indicated his/her understanding and acceptance.     Dental advisory given  Plan Discussed with:   Anesthesia Plan Comments:         Anesthesia Quick Evaluation

## 2021-12-25 NOTE — Interval H&P Note (Signed)
History and Physical Interval Note:  12/25/2021 10:29 AM  Susan Benton  has presented today for surgery, with the diagnosis of acquired absence breasts, history breast cancer.  The various methods of treatment have been discussed with the patient and family. After consideration of risks, benefits and other options for treatment, the patient has consented to  Procedure(s): BREAST RECONSTRUCTION WITH PLACEMENT OF TISSUE EXPANDER AND ALLODERM (Bilateral) as a surgical intervention.  The patient's history has been reviewed, patient examined, no change in status, stable for surgery.  I have reviewed the patient's chart and labs.  Questions were answered to the patient's satisfaction.     Arnoldo Hooker Evelyne Makepeace

## 2021-12-25 NOTE — Op Note (Signed)
Operative Note   DATE OF OPERATION: 8.1.23  LOCATION: [facility]-outpatient  SURGICAL DIVISION: Plastic Surgery  PREOPERATIVE DIAGNOSES:  1. History breast cancer 2. Acquired absence breasts  POSTOPERATIVE DIAGNOSES:  same  PROCEDURE:  1. Bilateral breast reconstruction with tissue expanders 2. Acellular dermis (Alloderm) to bilateral chest 600 cm2  SURGEON: Irene Limbo MD MBA  ASSISTANT: none  ANESTHESIA:  General.   EBL: 25 ml  COMPLICATIONS: None immediate.   INDICATIONS FOR PROCEDURE:  The patient, Susan Benton, is a 51 y.o. female born on 06-Nov-1970, is here for delayed prepectoral tissue expander reconstruction. Patient has a history of skin sparing mastectomies with immediate reconstruction, course complicated by seroma cellulitis right chest and underwent removal bilateral TE at that time.   FINDINGS: Natrelle 133S FV-13-T 500 ml tissue expanders placed bilateral. Initial fill volume 180 ml saline bilateral. RIGHT SN [id number] LEFT SN [id number]  DESCRIPTION OF PROCEDURE:  The patient's operative site was marked with the patient in the preoperative area. The patient was taken to the operating room. SCDs were placed and IV antibiotics were given. The patient's operative site was prepped and draped in a sterile fashion. A time out was performed and all information was confirmed to be correct. Incision made in left chest mastectomy scar and carried through superficial fascia to pectoralis muscle. Soft tissue elevated over pectoralis and chest wall to dimensions of expander. The cavity was irrigated with saline solution containing Ancef, gentamicin, and Betadine. Hemostasis was ensured. A 19 Fr drain was placed in subcutaneous position laterally and a 15 Fr drain placed along inframammary fold. Each secured to skin with 2-0 nylon. The tissue expander was prepared on back table prior in insertion. The expander was filled with air. Perforated acellular dermis was  draped  over anterior surface expander. The ADM was then secured to itself over posterior surface of expander with 4-0 chromic. Redundant folds acellular dermis excised so that the ADM lay flat without folds over air filled expander. The expander was secured to medial insertion pectoralis with a 0 vicryl. The superior and lateral tabs also secured to pectoralis muscle with 0-vicryl. The ADM was secured to pectoralis muscle and chest wall along inferior border at inframammary fold with 0 V lock. Closure completed with 3-0 vicryl in fascial layer and 4-0 vicryl in dermis. Skin closure completed with 4-0 monocryl subcuticular.  I then directed attention to right chest. Incision made in mastectomy scar and carried through superficial fascia to pectoralis muscle. Soft tissue elevated over pectoralis and chest wall to dimensions of expander. The cavity was irrigated with saline solution containing Ancef, gentamicin, and Betadine. Hemostasis was ensured. A 19 Fr drain was placed in subcutaneous position laterally and a 15 Fr drain placed along inframammary fold. Each secured to skin with 2-0 nylon. The tissue expander was prepared on back table prior in insertion. The expander was filled with air. Perforated acellular dermis was  draped over anterior surface expander. The ADM was then secured to itself over posterior surface of expander with 4-0 chromic. Redundant folds acellular dermis excised so that the ADM lay flat without folds over air filled expander. The expander was secured to medial insertion pectoralis with a 0 vicryl. The superior and lateral tabs also secured to pectoralis muscle with 0-vicryl. The ADM was secured to pectoralis muscle and chest wall along inferior border at inframammary fold with 0 V lock. Closure completed with 3-0 vicryl in fascial layer and 4-0 vicryl in dermis. Skin closure completed with 4-0 monocryl  subcuticular. Tissue adhesive applied to incisions. Each port was accessed and air removed.  Saline placed in each tissue expander. Tegaderms applied bilateral, followed by dry dressing and breast binder  The patient was allowed to wake from anesthesia, extubated and taken to the recovery room in satisfactory condition.   SPECIMENS: none  DRAINS: 15 and 19 Fr JP in right and left chest subcutaneous  Irene Limbo, MD [facility]  Office/ physician access line after hours [phone]

## 2021-12-25 NOTE — Transfer of Care (Signed)
Immediate Anesthesia Transfer of Care Note  Patient: Susan Benton  Procedure(s) Performed: BREAST RECONSTRUCTION WITH PLACEMENT OF TISSUE EXPANDER AND ALLODERM (Bilateral: Chest)  Patient Location: PACU  Anesthesia Type:General  Level of Consciousness: awake, alert , oriented, drowsy and patient cooperative  Airway & Oxygen Therapy: Patient Spontanous Breathing and Patient connected to face mask oxygen  Post-op Assessment: Report given to RN and Post -op Vital signs reviewed and stable  Post vital signs: Reviewed and stable  Last Vitals:  Vitals Value Taken Time  BP    Temp    Pulse    Resp    SpO2      Last Pain:  Vitals:   12/25/21 1012  TempSrc: Oral  PainSc: 0-No pain         Complications: No notable events documented.

## 2021-12-25 NOTE — Anesthesia Procedure Notes (Signed)
Procedure Name: LMA Insertion Date/Time: 12/25/2021 11:35 AM  Performed by: Willa Frater, CRNAPre-anesthesia Checklist: Patient identified, Emergency Drugs available, Suction available and Patient being monitored Patient Re-evaluated:Patient Re-evaluated prior to induction Oxygen Delivery Method: Circle system utilized Preoxygenation: Pre-oxygenation with 100% oxygen Induction Type: IV induction Ventilation: Mask ventilation without difficulty LMA: LMA inserted LMA Size: 4.0 Number of attempts: 1 Airway Equipment and Method: Bite block Placement Confirmation: positive ETCO2, breath sounds checked- equal and bilateral and CO2 detector Tube secured with: Tape Dental Injury: Teeth and Oropharynx as per pre-operative assessment

## 2021-12-25 NOTE — Discharge Instructions (Addendum)
No Tylenol before 6pm.  No Ibuprofen/NSAIDS before 8pm  Post Anesthesia Home Care Instructions  Activity: Get plenty of rest for the remainder of the day. A responsible individual must stay with you for 24 hours following the procedure.  For the next 24 hours, DO NOT: -Drive a car -Paediatric nurse -Drink alcoholic beverages -Take any medication unless instructed by your physician -Make any legal decisions or sign important papers.  Meals: Start with liquid foods such as gelatin or soup. Progress to regular foods as tolerated. Avoid greasy, spicy, heavy foods. If nausea and/or vomiting occur, drink only clear liquids until the nausea and/or vomiting subsides. Call your physician if vomiting continues.  Special Instructions/Symptoms: Your throat may feel dry or sore from the anesthesia or the breathing tube placed in your throat during surgery. If this causes discomfort, gargle with warm salt water. The discomfort should disappear within 24 hours.  If you had a scopolamine patch placed behind your ear for the management of post- operative nausea and/or vomiting:  1. The medication in the patch is effective for 72 hours, after which it should be removed.  Wrap patch in a tissue and discard in the trash. Wash hands thoroughly with soap and water. 2. You may remove the patch earlier than 72 hours if you experience unpleasant side effects which may include dry mouth, dizziness or visual disturbances. 3. Avoid touching the patch. Wash your hands with soap and water after contact with the patch.        JP Drain [employer] this sheet to all of your post-operative appointments while you have your drains. Please measure your drains by CC's or ML's. Make sure you drain and measure your JP Drains 2 or 3 times per day. At the end of each day, add up totals for the left side and add up totals for the right side.    ( 9 am )     ( 3 pm )        ( 9 pm )                Date L  R  L  R  L  R   Total L/R                                                                                                                Oxycodone given at 2:30pm.

## 2021-12-25 NOTE — Anesthesia Postprocedure Evaluation (Signed)
Anesthesia Post Note  Patient: Susan Benton  Procedure(s) Performed: BREAST RECONSTRUCTION WITH PLACEMENT OF TISSUE EXPANDER AND ALLODERM (Bilateral: Chest)     Patient location during evaluation: PACU Anesthesia Type: General Level of consciousness: awake and alert Pain management: pain level controlled Vital Signs Assessment: post-procedure vital signs reviewed and stable Respiratory status: spontaneous breathing, nonlabored ventilation and respiratory function stable Cardiovascular status: blood pressure returned to baseline and stable Postop Assessment: no apparent nausea or vomiting Anesthetic complications: no   No notable events documented.  Last Vitals:  Vitals:   12/25/21 1415 12/25/21 1452  BP: 138/62 (!) 140/78  Pulse: 64 72  Resp: 12 14  Temp:  36.5 C  SpO2: 100% 97%    Last Pain:  Vitals:   12/25/21 1415  TempSrc:   PainSc: Asleep                 Lidia Collum

## 2021-12-26 ENCOUNTER — Encounter (HOSPITAL_BASED_OUTPATIENT_CLINIC_OR_DEPARTMENT_OTHER): Payer: Self-pay | Admitting: Plastic Surgery

## 2021-12-26 NOTE — Progress Notes (Signed)
Left message stating courtesy call and if any questions or concerns please call the doctors office.  

## 2022-01-01 ENCOUNTER — Telehealth: Payer: Self-pay | Admitting: Obstetrics and Gynecology

## 2022-01-01 NOTE — Telephone Encounter (Signed)
Patient called and states that she was seen here on 12-19-2021 for a post op visit. Pt states that she was told by provider that she would be out of work for at least another 4-6 weeks. Pt is requesting a note stating that she will be out for another 4-6 wks starting on 12/19/2021 so she is able to receive her paycheck. Pt states the letter can be faxed to [phone]. Pt states to make the ATTN to short term disability department. Please advise.

## 2022-01-14 NOTE — Progress Notes (Unsigned)
    OBSTETRICS/GYNECOLOGY POST-OPERATIVE CLINIC VISIT  Subjective:     Susan Benton is a 51 y.o. female who presents to the clinic 10 weeks status post Vaginal cuff dehiscence repair and laparoscopy after laparoscopic hysterectomy in March. The patient is not having any pain but does continue to have pelvic pressure when sitting down for long periods of time.  Also reports having breast fillers implanted last week.  Is doing well.   The following portions of the patient's history were reviewed and updated as appropriate: allergies, current medications, past family history, past medical history, past social history, past surgical history, and problem list.  Review of Systems Pertinent items noted in HPI and remainder of comprehensive ROS otherwise negative.   Objective:   BP (!) 151/94   Pulse (!) 107   Resp 16   Ht 5' 8.5" (1.74 m)   Wt 207 lb (93.9 kg)   LMP 01/21/2017   BMI 31.02 kg/m  Body mass index is 31.02 kg/m.  General:  alert and no distress  Abdomen: soft, bowel sounds active, non-tender  Pelvis:   external genitalia normal, rectovaginal septum normal.  Vagina without discharge. Vaginal cuff appears to be well healed, Q-tip reveals slight superficial defect in left lateral edge.  Silver nitrate applied.      Assessment:   Patient s/p Vaginal cuff dehiscence repair and laparoscopy  (surgery)  Doing well postoperatively.   Plan:   1. Wound care discussed. 2. Activity restrictions:  pelvic rest for 2 additional weeks.  4. Anticipated return to work:  patient currently still out on leave from recent breast surgery . 6. Follow up: 2 weeks for final post-op check.     Rubie Maid, MD [facility]

## 2022-01-16 ENCOUNTER — Encounter: Payer: Self-pay | Admitting: Obstetrics and Gynecology

## 2022-01-16 ENCOUNTER — Ambulatory Visit (INDEPENDENT_AMBULATORY_CARE_PROVIDER_SITE_OTHER): Payer: Self-pay | Admitting: Obstetrics and Gynecology

## 2022-01-16 VITALS — BP 151/94 | HR 107 | Resp 16 | Ht 68.5 in | Wt 207.0 lb

## 2022-01-16 DIAGNOSIS — Z9889 Other specified postprocedural states: Secondary | ICD-10-CM

## 2022-01-16 DIAGNOSIS — Z4889 Encounter for other specified surgical aftercare: Secondary | ICD-10-CM

## 2022-01-24 ENCOUNTER — Encounter: Payer: Self-pay | Admitting: Obstetrics and Gynecology

## 2022-01-25 NOTE — Progress Notes (Deleted)
    OBSTETRICS/GYNECOLOGY POST-OPERATIVE CLINIC VISIT  Subjective:     Susan Benton is a 51 y.o. female who presents to the clinic  12  weeks status post  op  for Vaginal cuff dehiscence repair and laparoscopy after laparoscopic hysterectomy. Eating a regular diet {with-without:5700} difficulty. Bowel movements are {normal/abnormal***:19619}. {pain control:13522::"The patient is not having any pain."}  {Common ambulatory SmartLinks:19316}  Review of Systems {ros; complete:30496}   Objective:   LMP 01/21/2017  There is no height or weight on file to calculate BMI.  General:  alert and no distress  Abdomen: soft, bowel sounds active, non-tender  Incision:   {incision:13716::"no dehiscence","incision well approximated","healing well","no drainage","no erythema","no hernia","no seroma","no swelling"}    Pathology:    Assessment:   Patient s/p *** (surgery)  {doing well:13525::"Doing well postoperatively."}   Plan:   1. Continue any current medications as instructed by provider. 2. Wound care discussed. 3. Operative findings again reviewed. Pathology report discussed. 4. Activity restrictions: {restrictions:13723} 5. Anticipated return to work: {work return:14002}. 6. Follow up: {1-10:13787} {time; units:18646} for ***    Chilton Greathouse, CMA [facility]

## 2022-01-29 ENCOUNTER — Encounter: Payer: Self-pay | Admitting: Obstetrics and Gynecology

## 2022-02-01 ENCOUNTER — Encounter: Payer: Self-pay | Admitting: Obstetrics and Gynecology

## 2022-02-07 ENCOUNTER — Encounter: Payer: Self-pay | Admitting: Obstetrics and Gynecology

## 2022-02-07 ENCOUNTER — Ambulatory Visit (INDEPENDENT_AMBULATORY_CARE_PROVIDER_SITE_OTHER): Payer: Managed Care, Other (non HMO) | Admitting: Obstetrics and Gynecology

## 2022-02-07 VITALS — BP 132/86 | HR 74 | Resp 16 | Ht 68.5 in | Wt 208.8 lb

## 2022-02-07 DIAGNOSIS — Z4889 Encounter for other specified surgical aftercare: Secondary | ICD-10-CM

## 2022-02-07 DIAGNOSIS — Z9889 Other specified postprocedural states: Secondary | ICD-10-CM

## 2022-02-07 DIAGNOSIS — Z853 Personal history of malignant neoplasm of breast: Secondary | ICD-10-CM

## 2022-02-07 NOTE — Progress Notes (Signed)
    OBSTETRICS/GYNECOLOGY POST-OPERATIVE CLINIC VISIT  Subjective:     Susan Benton is a 51 y.o. female who presents to the clinic  13  weeks status post vaginal cuff dehiscence repair and laparoscopy after laparoscopic hysterectomy in March. Today she reports that she is no longer having the pelvic pressure she has been feeling since her surgery.  She did also recently have breast surgery several weeks ago (new expanders placed bilaterally).     The following portions of the patient's history were reviewed and updated as appropriate: allergies, current medications, past family history, past medical history, past social history, past surgical history, and problem list.  Review of Systems Pertinent items noted in HPI and remainder of comprehensive ROS otherwise negative.   Objective:   BP 132/86   Pulse 74   Resp 16   Ht 5' 8.5" (1.74 m)   Wt 208 lb 12.8 oz (94.7 kg)   LMP 01/21/2017   BMI 31.29 kg/m  Body mass index is 31.29 kg/m.  General:  alert and no distress  Abdomen: soft, bowel sounds active, non-tender  Incision:   external genitalia normal, rectovaginal septum normal.  Vagina without discharge. Small left lateral edge defect no longer observed today.      Assessment:   Patient s/p Repair Vaginal Cuff Laparoscopic Diagnostic. (surgery)  Doing well postoperatively. History of breast cancer   Plan:   1. Continue any current medications as instructed by provider. 2. Activity restrictions: none 3. Anticipated return to work:  patient has already returned to work . 4. Follow up: as needed.  Patient can see GYN every other year. Has PCP and Oncologist follow ups regularly.     Rubie Maid, MD [facility]

## 2022-02-20 ENCOUNTER — Encounter: Payer: Self-pay | Admitting: Hematology

## 2022-02-20 ENCOUNTER — Inpatient Hospital Stay: Payer: Managed Care, Other (non HMO) | Attending: Hematology | Admitting: Hematology

## 2022-02-20 ENCOUNTER — Inpatient Hospital Stay: Payer: Managed Care, Other (non HMO)

## 2022-02-20 ENCOUNTER — Other Ambulatory Visit: Payer: Self-pay

## 2022-02-20 VITALS — BP 139/86 | HR 72 | Temp 97.9°F | Resp 18 | Ht 68.5 in | Wt 213.8 lb

## 2022-02-20 DIAGNOSIS — Z79899 Other long term (current) drug therapy: Secondary | ICD-10-CM | POA: Insufficient documentation

## 2022-02-20 DIAGNOSIS — R911 Solitary pulmonary nodule: Secondary | ICD-10-CM | POA: Insufficient documentation

## 2022-02-20 DIAGNOSIS — Z79810 Long term (current) use of selective estrogen receptor modulators (SERMs): Secondary | ICD-10-CM | POA: Diagnosis not present

## 2022-02-20 DIAGNOSIS — C50411 Malignant neoplasm of upper-outer quadrant of right female breast: Secondary | ICD-10-CM

## 2022-02-20 DIAGNOSIS — G2581 Restless legs syndrome: Secondary | ICD-10-CM | POA: Diagnosis not present

## 2022-02-20 DIAGNOSIS — I10 Essential (primary) hypertension: Secondary | ICD-10-CM | POA: Insufficient documentation

## 2022-02-20 DIAGNOSIS — Z170 Estrogen receptor positive status [ER+]: Secondary | ICD-10-CM | POA: Insufficient documentation

## 2022-02-20 DIAGNOSIS — K769 Liver disease, unspecified: Secondary | ICD-10-CM | POA: Diagnosis not present

## 2022-02-20 DIAGNOSIS — Z87442 Personal history of urinary calculi: Secondary | ICD-10-CM | POA: Insufficient documentation

## 2022-02-20 DIAGNOSIS — B182 Chronic viral hepatitis C: Secondary | ICD-10-CM | POA: Insufficient documentation

## 2022-02-20 LAB — CBC WITH DIFFERENTIAL/PLATELET
Abs Immature Granulocytes: 0.01 10*3/uL (ref 0.00–0.07)
Basophils Absolute: 0.1 10*3/uL (ref 0.0–0.1)
Basophils Relative: 1 %
Eosinophils Absolute: 0.4 10*3/uL (ref 0.0–0.5)
Eosinophils Relative: 5 %
HCT: 36.8 % (ref 36.0–46.0)
Hemoglobin: 12.3 g/dL (ref 12.0–15.0)
Immature Granulocytes: 0 %
Lymphocytes Relative: 29 %
Lymphs Abs: 2 10*3/uL (ref 0.7–4.0)
MCH: 28.4 pg (ref 26.0–34.0)
MCHC: 33.4 g/dL (ref 30.0–36.0)
MCV: 85 fL (ref 80.0–100.0)
Monocytes Absolute: 0.4 10*3/uL (ref 0.1–1.0)
Monocytes Relative: 5 %
Neutro Abs: 4.2 10*3/uL (ref 1.7–7.7)
Neutrophils Relative %: 60 %
Platelets: 337 10*3/uL (ref 150–400)
RBC: 4.33 MIL/uL (ref 3.87–5.11)
RDW: 12.4 % (ref 11.5–15.5)
WBC: 7 10*3/uL (ref 4.0–10.5)
nRBC: 0 % (ref 0.0–0.2)

## 2022-02-20 LAB — COMPREHENSIVE METABOLIC PANEL
ALT: 22 U/L (ref 0–44)
AST: 27 U/L (ref 15–41)
Albumin: 4.1 g/dL (ref 3.5–5.0)
Alkaline Phosphatase: 72 U/L (ref 38–126)
Anion gap: 8 (ref 5–15)
BUN: 18 mg/dL (ref 6–20)
CO2: 26 mmol/L (ref 22–32)
Calcium: 9.3 mg/dL (ref 8.9–10.3)
Chloride: 103 mmol/L (ref 98–111)
Creatinine, Ser: 0.74 mg/dL (ref 0.44–1.00)
GFR, Estimated: >60 mL/min (ref >60)
Glucose, Bld: 118 mg/dL — ABNORMAL HIGH (ref 70–99)
Potassium: 4.3 mmol/L (ref 3.5–5.1)
Sodium: 137 mmol/L (ref 135–145)
Total Bilirubin: 0.5 mg/dL (ref 0.3–1.2)
Total Protein: 7.5 g/dL (ref 6.5–8.1)

## 2022-02-20 NOTE — Progress Notes (Signed)
Susan Benton   [phone] [phone]   Clinic Follow up Note   Patient Care Team: Celene Squibb, MD as PCP - General (Internal Medicine) Domingo Pulse, MD as Consulting Physician (Urology) Truitt Merle, MD as Consulting Physician (Hematology)  Date of Service:  02/20/2022  CHIEF COMPLAINT: f/u of right breast cancer  CURRENT THERAPY:  Surveillance  ASSESSMENT & PLAN:  Susan Benton is a 51 y.o. 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 S/p bilateral mastectomies on 10/23/16 under Dr. Arnoldo Morale. Right breast path showed multifocal IDC, grade 1, 4.6 cm and 0.6 cm, and low grade DCIS. Left breast was benign. -oncotype recurrence score of 9 (low risk).  -Began tamoxifen around 02/2017. Tolerated moderately well with joint stiffness. She developed postmenopausal bleeding and underwent hysterectomy with BSO on 07/26/21. Tamoxifen discontinued at 11/19/21 visit.  -we discussed option of starting aromatase inhibitor; she is hesitant to proceed given side effects from tamoxifen (muscular and joint pain).  Given overall low risk disease, and unlikely side effect from AI, I think it is reasonable to continue cancer surveillance alone. -she underwent tissue expander placement with Dr. Iran Planas on 12/25/21 and is scheduled for implant placement 03/05/22. -she is clinically doing well, side effects from tamoxifen have improved. Labs reviewed, all WNL. Physical exam was unremarkable. There is no clinical concern for recurrence.  2. Liver abnormality -CT CAP 10/30/21 showed: 2.4 cm subtle area in central liver, most likely focal fatty deposition.  -abdomen MRI 11/18/21 showed: background heterogenous steatosis; area of concern on CT strongly favored to be benign area of focal hepatic steatosis; no definite suspicious hepatic lesions -plan to repeat MRI in late 04/2022.  3. Pulmonary nodule -tiny  LLL nodule seen on restaging CT CAP on 10/30/21.  -she is a former smoker, meaning she is at higher risk for cancer. We will repeat CT in 10/2022.   PLAN: -abdomen MRI in 3 months, with phone visit several days later -chest CT in 10/2021, with phone visit several days later -she would like to follow-up with [place] annually.   No problem-specific Assessment & Plan notes found for this encounter.   SUMMARY OF ONCOLOGIC HISTORY: Oncology History  Malignant neoplasm of upper-outer quadrant of right female breast [facility])   Initial Diagnosis   Malignant neoplasm of upper-outer quadrant of right female breast ([place])   10/08/2016 Initial Biopsy   (R) breast needle biopsy (10:00): Invasive ductal carcinoma, grade 1-2, with DCIS.  ER+ (100%), PR+ (100%), Ki67 10%, HER2 neg (ratio 1.03).    10/23/2016 Surgery   Bilateral mastectomies Arnoldo Morale)   10/23/2016 Pathology Results   RIGHT mastectomy: Multifocal IDC, grade 1, spanning 4.6 cm & 0.6 cm. Low grade DCIS. Negative margins. HER2 repeated and neg (ratio 1.36).  LEFT mastectomy: Fibroadenoma, no malignancy.    11/20/2016 Surgery   (R) axillary sentinel lymph node biopsy Arnoldo Morale)   11/20/2016 Pathology Results   0/1 axillary sentinel LN biopsy for carcinoma.    11/28/2016 Oncotype testing   Recurrence score: 9 (low risk); associated with 6% risk of distant recurrence.    12/03/2016 Imaging   CT chest: IMPRESSION: 1. 7.7 cm lobulated fluid attenuation collection in the right axilla, could represent a postoperative fluid collection, with or without presence of infection. Further management will need to be based on clinical grounds. Could correlate with targeted sonography and aspiration as indicated. 2. Clear lung fields 3. Hepatic steatosis 4.  Aortic Atherosclerosis   12/13/2016 Miscellaneous   BCI genomic testing revealed LOW risk of recurrence with 4.3% risk and LOW likelihood of benefit of extension of therapy beyond 5 years.         INTERVAL HISTORY:  Susan Benton is here for a follow up of breast cancer. She was last seen by me on 11/19/21. She presents to the clinic alone. She reports she is feeling better off the tamoxifen, specifically improvement to joint pain.   All other systems were reviewed with the patient and are negative.  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   Low iron    Mesenteric thrombosis (HCC)    after taking tamoxifan-on plavix and asa   PONV (postoperative nausea and vomiting)    Restless legs syndrome     SURGICAL HISTORY: Past Surgical History:  Procedure Laterality Date   AXILLARY SENTINEL NODE BIOPSY Right 11/20/2016   Procedure: AXILLARY SENTINEL NODE BIOPSY;  Surgeon: Franky Macho, MD;  Location: [facility];  Service: General;  Laterality: Right;  Sentinel Node Injection @ 10:30am   BLADDER REPAIR  2022   BREAST RECONSTRUCTION WITH PLACEMENT OF TISSUE EXPANDER AND ALLODERM Bilateral 12/25/2021   Procedure: BREAST RECONSTRUCTION WITH PLACEMENT OF TISSUE EXPANDER AND ALLODERM;  Surgeon: Glenna Fellows, MD;  Location: [facility];  Service: Plastics;  Laterality: Bilateral;   BREAST RECONSTRUCTION WITH PLACEMENT OF TISSUE EXPANDER AND FLEX HD (ACELLULAR HYDRATED DERMIS) Bilateral 12/25/2016   Procedure: BREAST RECONSTRUCTION WITH PLACEMENT OF TISSUE EXPANDER AND FLEX HD (ACELLULAR HYDRATED DERMIS);  Surgeon: Peggye Form, DO;  Location: [facility];  Service: Plastics;  Laterality: Bilateral;   CARDIAC CATHETERIZATION     CYSTOSCOPY  07/26/2021   Procedure: CYSTOSCOPY;  Surgeon: Nadara Mustard, MD;  Location: [facility];  Service: Gynecology;;   DILITATION & CURRETTAGE/HYSTROSCOPY WITH NOVASURE ABLATION N/A 10/23/2016   Procedure: DILATATION & CURETTAGE/HYSTEROSCOPY WITH NOVASURE ENDOMETRIAL ABLATION;  Surgeon: Lazaro Arms, MD;  Location: [facility];  Service: Gynecology;  Laterality: N/A;   KIDNEY CYST REMOVAL     LAPAROSCOPY N/A 11/08/2021   Procedure: LAPAROSCOPY DIAGNOSTIC;  Surgeon: Hildred Laser, MD;  Location: [facility];  Service: Gynecology;  Laterality: N/A;   MASTECTOMY MODIFIED RADICAL Right 10/23/2016   Procedure: MASTECTOMY MODIFIED RADICAL;  Surgeon: Franky Macho, MD;  Location: [facility];  Service: General;  Laterality: Right;   REPAIR VAGINAL CUFF N/A 11/08/2021   Procedure: REPAIR VAGINAL CUFF;  Surgeon: Hildred Laser, MD;  Location: [facility];  Service: Gynecology;  Laterality: N/A;   ROTATOR CUFF REPAIR     SIMPLE MASTECTOMY WITH AXILLARY SENTINEL NODE BIOPSY Left 10/23/2016   Procedure: SIMPLE MASTECTOMY;  Surgeon: Franky Macho, MD;  Location: [facility];  Service: General;  Laterality: Left;   TISSUE EXPANDER PLACEMENT Bilateral 02/27/2017   Procedure: REMOVAL TISSUE EXPANDER;  Surgeon: Peggye Form, DO;  Location: [facility];  Service: Plastics;  Laterality: Bilateral;   TONSILECTOMY, ADENOIDECTOMY, BILATERAL MYRINGOTOMY AND TUBES Bilateral 1984   TOTAL LAPAROSCOPIC HYSTERECTOMY WITH BILATERAL SALPINGO OOPHORECTOMY Bilateral 07/26/2021  Procedure: TOTAL LAPAROSCOPIC HYSTERECTOMY WITH BILATERAL SALPINGO OOPHORECTOMY;  Surgeon: Gae Dry, MD;  Location: [facility];  Service: Gynecology;  Laterality: Bilateral;   TUBAL LIGATION  1998   2000    I have reviewed the social history and family history with the patient and they are unchanged from previous note.  ALLERGIES:  is allergic to tramadol.  MEDICATIONS:  Current Outpatient Medications  Medication Sig Dispense Refill   allopurinol (ZYLOPRIM) 300 MG tablet Take 300 mg by mouth daily.     ALPRAZolam  (XANAX) 0.5 MG tablet TAKE 1 TABLET BY MOUTH TWICE A DAY AS NEEDED FOR ANXIETY (Patient taking differently: Take 0.5 mg by mouth 2 (two) times daily.) 30 tablet 0   atenolol (TENORMIN) 100 MG tablet Take 1 tablet (100 mg total) by mouth daily. 30 tablet 1   cloNIDine (CATAPRES) 0.1 MG tablet Take 1 tablet (0.1 mg total) by mouth daily. 30 tablet 11   conjugated estrogens (PREMARIN) vaginal cream Place 1 Applicatorful vaginally daily. 42.5 g 12   docusate sodium (COLACE) 100 MG capsule Take 1 capsule (100 mg total) by mouth 2 (two) times daily as needed. 30 capsule 2   gabapentin (NEURONTIN) 300 MG capsule TAKE ONE CAPSULE BY MOUTH 3 TIMES A DAY (Patient taking differently: Take 300 mg by mouth daily as needed (Nerve pain).) 90 capsule 2   ondansetron (ZOFRAN-ODT) 4 MG disintegrating tablet Take 1 tablet (4 mg total) by mouth every 6 (six) hours as needed for nausea. 20 tablet 0   oxyCODONE (ROXICODONE) 15 MG immediate release tablet Take 15 mg by mouth every 4 (four) hours as needed for pain.     Potassium Citrate 15 MEQ (1620 MG) TBCR Take 1 tablet by mouth 2 (two) times daily.     rOPINIRole (REQUIP) 2 MG tablet 1-2 tabs po qhs (Patient taking differently: Take 2 mg by mouth daily as needed (Restless leg at bedtime).) 60 tablet 3   No current facility-administered medications for this visit.    PHYSICAL EXAMINATION: ECOG PERFORMANCE STATUS: 0 - Asymptomatic  Vitals:   02/20/22 1424  BP: 139/86  Pulse: 72  Resp: 18  Temp: 97.9 F (36.6 C)  SpO2: 99%   Wt Readings from Last 3 Encounters:  02/20/22 213 lb 12.8 oz (97 kg)  02/07/22 208 lb 12.8 oz (94.7 kg)  01/16/22 207 lb (93.9 kg)     GENERAL:alert, no distress and comfortable SKIN: skin color, texture, turgor are normal, no rashes or significant lesions EYES: normal, Conjunctiva are pink and non-injected, sclera clear  NECK: supple, thyroid normal size, non-tender, without nodularity LYMPH:  no palpable lymphadenopathy in the  cervical, axillary LUNGS: clear to auscultation and percussion with normal breathing effort HEART: regular rate & rhythm and no murmurs and no lower extremity edema ABDOMEN:abdomen soft, non-tender and normal bowel sounds Musculoskeletal:no cyanosis of digits and no clubbing  NEURO: alert & oriented x 3 with fluent speech, no focal motor/sensory deficits BREAST: No palpable mass, nodules or adenopathy bilaterally. Breast exam benign.   LABORATORY DATA:  I have reviewed the data as listed    Latest Ref Rng & Units 02/20/2022    2:06 PM 10/30/2021    1:33 PM 07/20/2021    2:24 PM  CBC  WBC 4.0 - 10.5 K/uL 7.0  10.4  9.9   Hemoglobin 12.0 - 15.0 g/dL 12.3  12.0  12.2   Hematocrit 36.0 - 46.0 % 36.8  35.0  37.6   Platelets 150 - 400 K/uL  337  314  320         Latest Ref Rng & Units 02/20/2022    2:06 PM 10/30/2021    1:33 PM 06/04/2021    4:07 PM  CMP  Glucose 70 - 99 mg/dL 118  146  101   BUN 6 - 20 mg/dL $Remove'18  17  28   'gXHAYii$ Creatinine 0.44 - 1.00 mg/dL 0.74  0.80  0.87   Sodium 135 - 145 mmol/L 137  137  136   Potassium 3.5 - 5.1 mmol/L 4.3  3.9  4.3   Chloride 98 - 111 mmol/L 103  105  104   CO2 22 - 32 mmol/L $RemoveB'26  25  22   'jgoXpahW$ Calcium 8.9 - 10.3 mg/dL 9.3  9.3  9.4   Total Protein 6.5 - 8.1 g/dL 7.5  6.8  7.5   Total Bilirubin 0.3 - 1.2 mg/dL 0.5  0.3  0.3   Alkaline Phos 38 - 126 U/L 72  58  71   AST 15 - 41 U/L 27  30  32   ALT 0 - 44 U/L 22  45  46       RADIOGRAPHIC STUDIES: I have personally reviewed the radiological images as listed and agreed with the findings in the report. No results found.    No orders of the defined types were placed in this encounter.  All questions were answered. The patient knows to call the clinic with any problems, questions or concerns. No barriers to learning was detected. The total time spent in the appointment was 30 minutes.     Truitt Merle, MD 02/20/2022   I, Wilburn Mylar, am acting as scribe for Truitt Merle, MD.   I have reviewed the  above documentation for accuracy and completeness, and I agree with the above.

## 2022-02-21 LAB — CANCER ANTIGEN 27.29: CA 27.29: 11.8 U/mL (ref 0.0–38.6)
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
## 2022-02-26 ENCOUNTER — Encounter (HOSPITAL_BASED_OUTPATIENT_CLINIC_OR_DEPARTMENT_OTHER): Payer: Self-pay | Admitting: Plastic Surgery

## 2022-03-01 ENCOUNTER — Other Ambulatory Visit: Payer: Self-pay | Admitting: Obstetrics and Gynecology

## 2022-03-04 NOTE — Anesthesia Preprocedure Evaluation (Signed)
Anesthesia Evaluation  Patient identified by MRN, date of birth, ID band Patient awake    Reviewed: Allergy & Precautions, NPO status , Patient's Chart, lab work & pertinent test results  History of Anesthesia Complications (+) PONV and history of anesthetic complications  Airway Mallampati: II  TM Distance: >3 FB Neck ROM: Full    Dental  (+) Dental Advisory Given, Teeth Intact   Pulmonary neg pulmonary ROS, former smoker,    Pulmonary exam normal        Cardiovascular hypertension, Normal cardiovascular exam     Neuro/Psych  Headaches, PSYCHIATRIC DISORDERS Anxiety Depression    GI/Hepatic negative GI ROS, (+) Hepatitis -, C  Endo/Other  negative endocrine ROS  Renal/GU negative Renal ROS  negative genitourinary   Musculoskeletal  (+) Arthritis ,   Abdominal   Peds  Hematology negative hematology ROS (+)   Anesthesia Other Findings H/o breast cancer  Reproductive/Obstetrics                             Anesthesia Physical  Anesthesia Plan  ASA: 2  Anesthesia Plan: General   Post-op Pain Management: Tylenol PO (pre-op)* and Celebrex PO (pre-op)*   Induction: Intravenous  PONV Risk Score and Plan: 4 or greater and Ondansetron, Dexamethasone, Midazolam and Treatment may vary due to age or medical condition  Airway Management Planned: LMA  Additional Equipment: None  Intra-op Plan:   Post-operative Plan: Extubation in OR  Informed Consent: I have reviewed the patients History and Physical, chart, labs and discussed the procedure including the risks, benefits and alternatives for the proposed anesthesia with the patient or authorized representative who has indicated his/her understanding and acceptance.     Dental advisory given  Plan Discussed with: Anesthesiologist and CRNA  Anesthesia Plan Comments:         Anesthesia Quick Evaluation

## 2022-03-04 NOTE — Progress Notes (Signed)

## 2022-03-05 ENCOUNTER — Ambulatory Visit (HOSPITAL_BASED_OUTPATIENT_CLINIC_OR_DEPARTMENT_OTHER): Payer: Managed Care, Other (non HMO) | Admitting: Anesthesiology

## 2022-03-05 ENCOUNTER — Encounter (HOSPITAL_BASED_OUTPATIENT_CLINIC_OR_DEPARTMENT_OTHER): Payer: Self-pay | Admitting: Plastic Surgery

## 2022-03-05 ENCOUNTER — Encounter (HOSPITAL_BASED_OUTPATIENT_CLINIC_OR_DEPARTMENT_OTHER)
Admission: RE | Disposition: A | Discharge status: Home / Self Care | Payer: Self-pay | Source: Home / Self Care | Attending: Plastic Surgery

## 2022-03-05 ENCOUNTER — Ambulatory Visit (HOSPITAL_BASED_OUTPATIENT_CLINIC_OR_DEPARTMENT_OTHER)
Admission: RE | Admit: 2022-03-05 | Discharge: 2022-03-05 | Disposition: A | Discharge status: Home / Self Care | Payer: Managed Care, Other (non HMO) | Attending: Plastic Surgery | Admitting: Plastic Surgery

## 2022-03-05 DIAGNOSIS — Z45811 Encounter for adjustment or removal of right breast implant: Secondary | ICD-10-CM

## 2022-03-05 DIAGNOSIS — Z9013 Acquired absence of bilateral breasts and nipples: Secondary | ICD-10-CM | POA: Diagnosis not present

## 2022-03-05 DIAGNOSIS — Z853 Personal history of malignant neoplasm of breast: Secondary | ICD-10-CM | POA: Diagnosis not present

## 2022-03-05 DIAGNOSIS — Z421 Encounter for breast reconstruction following mastectomy: Secondary | ICD-10-CM | POA: Diagnosis present

## 2022-03-05 DIAGNOSIS — Z01818 Encounter for other preprocedural examination: Secondary | ICD-10-CM

## 2022-03-05 DIAGNOSIS — Z87891 Personal history of nicotine dependence: Secondary | ICD-10-CM | POA: Insufficient documentation

## 2022-03-05 DIAGNOSIS — Z79891 Long term (current) use of opiate analgesic: Secondary | ICD-10-CM | POA: Diagnosis not present

## 2022-03-05 DIAGNOSIS — Z45812 Encounter for adjustment or removal of left breast implant: Secondary | ICD-10-CM

## 2022-03-05 HISTORY — PX: REMOVAL OF BILATERAL TISSUE EXPANDERS WITH PLACEMENT OF BILATERAL BREAST IMPLANTS: SHX6431

## 2022-03-05 SURGERY — REMOVAL, TISSUE EXPANDER, BREAST, BILATERAL, WITH BILATERAL IMPLANT IMPLANT INSERTION
Anesthesia: General | Site: Chest | Laterality: Bilateral

## 2022-03-05 MED ORDER — PHENYLEPHRINE HCL-NACL 20-0.9 MG/250ML-% IV SOLN
INTRAVENOUS | Status: DC | PRN
  Administered 2022-03-05: 30 ug/min via INTRAVENOUS

## 2022-03-05 MED ORDER — BUPIVACAINE HCL (PF) 0.25 % IJ SOLN
INTRAMUSCULAR | Status: DC | PRN
  Administered 2022-03-05: 30 mL

## 2022-03-05 MED ORDER — HYDROMORPHONE HCL 1 MG/ML IJ SOLN
INTRAMUSCULAR | Status: AC
  Filled 2022-03-05: qty 0.5

## 2022-03-05 MED ORDER — FENTANYL CITRATE (PF) 100 MCG/2ML IJ SOLN
INTRAMUSCULAR | Status: DC | PRN
  Administered 2022-03-05 (×2): 50 ug via INTRAVENOUS
  Administered 2022-03-05 (×2): 25 ug via INTRAVENOUS

## 2022-03-05 MED ORDER — OXYCODONE HCL 5 MG/5ML PO SOLN: 5.0000 mg | Freq: Once | ORAL | Status: AC | PRN

## 2022-03-05 MED ORDER — OXYCODONE HCL 5 MG PO TABS
ORAL_TABLET | ORAL | Status: AC
  Filled 2022-03-05: qty 1

## 2022-03-05 MED ORDER — PHENYLEPHRINE HCL (PRESSORS) 10 MG/ML IV SOLN
INTRAVENOUS | Status: AC
  Filled 2022-03-05: qty 1

## 2022-03-05 MED ORDER — GABAPENTIN 300 MG PO CAPS
300.0000 mg | ORAL_CAPSULE | ORAL | Status: AC
  Administered 2022-03-05: 300 mg via ORAL

## 2022-03-05 MED ORDER — GABAPENTIN 300 MG PO CAPS
ORAL_CAPSULE | ORAL | Status: AC
  Filled 2022-03-05: qty 1

## 2022-03-05 MED ORDER — ACETAMINOPHEN 500 MG PO TABS: 1000.0000 mg | ORAL_TABLET | ORAL | Status: AC

## 2022-03-05 MED ORDER — DEXAMETHASONE SODIUM PHOSPHATE 10 MG/ML IJ SOLN
INTRAMUSCULAR | Status: DC | PRN
  Administered 2022-03-05: 10 mg via INTRAVENOUS

## 2022-03-05 MED ORDER — ACETAMINOPHEN 160 MG/5ML PO SOLN: 325.0000 mg | ORAL | Status: DC | PRN

## 2022-03-05 MED ORDER — ONDANSETRON HCL 4 MG/2ML IJ SOLN: 4.0000 mg | Freq: Once | INTRAMUSCULAR | Status: DC | PRN

## 2022-03-05 MED ORDER — CEFAZOLIN SODIUM-DEXTROSE 2-4 GM/100ML-% IV SOLN
INTRAVENOUS | Status: AC
  Filled 2022-03-05: qty 100

## 2022-03-05 MED ORDER — EPHEDRINE SULFATE (PRESSORS) 50 MG/ML IJ SOLN
INTRAMUSCULAR | Status: DC | PRN
  Administered 2022-03-05: 10 mg via INTRAVENOUS
  Administered 2022-03-05: 5 mg via INTRAVENOUS
  Administered 2022-03-05: 10 mg via INTRAVENOUS

## 2022-03-05 MED ORDER — ONDANSETRON HCL 4 MG/2ML IJ SOLN
INTRAMUSCULAR | Status: AC
  Filled 2022-03-05: qty 2

## 2022-03-05 MED ORDER — BUPIVACAINE HCL (PF) 0.25 % IJ SOLN
INTRAMUSCULAR | Status: AC
  Filled 2022-03-05: qty 60

## 2022-03-05 MED ORDER — MIDAZOLAM HCL 2 MG/2ML IJ SOLN
INTRAMUSCULAR | Status: AC
  Filled 2022-03-05: qty 2

## 2022-03-05 MED ORDER — ACETAMINOPHEN 500 MG PO TABS
1000.0000 mg | ORAL_TABLET | Freq: Once | ORAL | Status: AC
  Administered 2022-03-05: 1000 mg via ORAL

## 2022-03-05 MED ORDER — MIDAZOLAM HCL 5 MG/5ML IJ SOLN
INTRAMUSCULAR | Status: DC | PRN
  Administered 2022-03-05: 2 mg via INTRAVENOUS

## 2022-03-05 MED ORDER — LIDOCAINE HCL (CARDIAC) PF 100 MG/5ML IV SOSY
PREFILLED_SYRINGE | INTRAVENOUS | Status: DC | PRN
  Administered 2022-03-05: 100 mg via INTRATRACHEAL

## 2022-03-05 MED ORDER — FENTANYL CITRATE (PF) 100 MCG/2ML IJ SOLN
25.0000 ug | INTRAMUSCULAR | Status: DC | PRN
  Administered 2022-03-05 (×3): 50 ug via INTRAVENOUS

## 2022-03-05 MED ORDER — HYDROMORPHONE HCL 1 MG/ML IJ SOLN
0.5000 mg | INTRAMUSCULAR | Status: AC | PRN
  Administered 2022-03-05 (×3): 0.5 mg via INTRAVENOUS

## 2022-03-05 MED ORDER — CELECOXIB 200 MG PO CAPS: 200.0000 mg | ORAL_CAPSULE | ORAL | Status: AC

## 2022-03-05 MED ORDER — PHENYLEPHRINE HCL (PRESSORS) 10 MG/ML IV SOLN
INTRAVENOUS | Status: DC | PRN
  Administered 2022-03-05: 240 ug via INTRAVENOUS
  Administered 2022-03-05: 80 ug via INTRAVENOUS
  Administered 2022-03-05: 240 ug via INTRAVENOUS

## 2022-03-05 MED ORDER — PROPOFOL 10 MG/ML IV BOLUS
INTRAVENOUS | Status: DC | PRN
  Administered 2022-03-05: 160 mg via INTRAVENOUS

## 2022-03-05 MED ORDER — CELECOXIB 200 MG PO CAPS
200.0000 mg | ORAL_CAPSULE | Freq: Once | ORAL | Status: AC
  Administered 2022-03-05: 200 mg via ORAL

## 2022-03-05 MED ORDER — CELECOXIB 200 MG PO CAPS
ORAL_CAPSULE | ORAL | Status: AC
  Filled 2022-03-05: qty 1

## 2022-03-05 MED ORDER — DEXAMETHASONE SODIUM PHOSPHATE 10 MG/ML IJ SOLN
INTRAMUSCULAR | Status: AC
  Filled 2022-03-05: qty 1

## 2022-03-05 MED ORDER — LACTATED RINGERS IV SOLN
INTRAVENOUS | Status: DC
Start: 2022-03-05 — End: 2022-03-05

## 2022-03-05 MED ORDER — SODIUM CHLORIDE 0.9 % IV SOLN
INTRAVENOUS | Status: AC
  Filled 2022-03-05: qty 10

## 2022-03-05 MED ORDER — CHLORHEXIDINE GLUCONATE CLOTH 2 % EX PADS: 6.0000 | MEDICATED_PAD | Freq: Once | CUTANEOUS | Status: DC

## 2022-03-05 MED ORDER — ACETAMINOPHEN 500 MG PO TABS
ORAL_TABLET | ORAL | Status: AC
  Filled 2022-03-05: qty 2

## 2022-03-05 MED ORDER — CEFAZOLIN SODIUM-DEXTROSE 2-4 GM/100ML-% IV SOLN
2.0000 g | INTRAVENOUS | Status: AC
  Administered 2022-03-05: 2 g via INTRAVENOUS

## 2022-03-05 MED ORDER — FENTANYL CITRATE (PF) 100 MCG/2ML IJ SOLN
INTRAMUSCULAR | Status: AC
  Filled 2022-03-05: qty 2

## 2022-03-05 MED ORDER — MEPERIDINE HCL 25 MG/ML IJ SOLN: 6.2500 mg | INTRAMUSCULAR | Status: DC | PRN

## 2022-03-05 MED ORDER — ONDANSETRON HCL 4 MG/2ML IJ SOLN
INTRAMUSCULAR | Status: DC | PRN
  Administered 2022-03-05: 4 mg via INTRAVENOUS

## 2022-03-05 MED ORDER — LIDOCAINE 2% (20 MG/ML) 5 ML SYRINGE
INTRAMUSCULAR | Status: AC
  Filled 2022-03-05: qty 5

## 2022-03-05 MED ORDER — OXYCODONE HCL 5 MG PO TABS
5.0000 mg | ORAL_TABLET | Freq: Once | ORAL | Status: AC | PRN
  Administered 2022-03-05: 5 mg via ORAL

## 2022-03-05 MED ORDER — SODIUM CHLORIDE 0.9 % IV SOLN
INTRAVENOUS | Status: DC | PRN
  Administered 2022-03-05: 500 mL

## 2022-03-05 MED ORDER — EPHEDRINE 5 MG/ML INJ
INTRAVENOUS | Status: AC
  Filled 2022-03-05: qty 5

## 2022-03-05 MED ORDER — PROPOFOL 10 MG/ML IV BOLUS
INTRAVENOUS | Status: AC
  Filled 2022-03-05: qty 20

## 2022-03-05 MED ORDER — ACETAMINOPHEN 325 MG PO TABS: 325.0000 mg | ORAL_TABLET | ORAL | Status: DC | PRN

## 2022-03-05 SURGICAL SUPPLY — 66 items
ADH SKN CLS APL DERMABOND .7 (GAUZE/BANDAGES/DRESSINGS) ×1
APL PRP STRL LF DISP 70% ISPRP (MISCELLANEOUS) ×2
BAG DECANTER FOR FLEXI CONT (MISCELLANEOUS) ×1 IMPLANT
BINDER BREAST 3XL (GAUZE/BANDAGES/DRESSINGS) IMPLANT
BINDER BREAST LRG (GAUZE/BANDAGES/DRESSINGS) IMPLANT
BINDER BREAST MEDIUM (GAUZE/BANDAGES/DRESSINGS) IMPLANT
BINDER BREAST XLRG (GAUZE/BANDAGES/DRESSINGS) IMPLANT
BINDER BREAST XXLRG (GAUZE/BANDAGES/DRESSINGS) IMPLANT
BLADE SURG 10 STRL SS (BLADE) ×2 IMPLANT
BNDG GAUZE DERMACEA FLUFF 4 (GAUZE/BANDAGES/DRESSINGS) ×2 IMPLANT
BNDG GZE DERMACEA 4 6PLY (GAUZE/BANDAGES/DRESSINGS) ×2
CANISTER SUCT 1200ML W/VALVE (MISCELLANEOUS) ×1 IMPLANT
CHLORAPREP W/TINT 26 (MISCELLANEOUS) ×2 IMPLANT
COVER BACK TABLE 60X90IN (DRAPES) ×1 IMPLANT
COVER MAYO STAND STRL (DRAPES) ×1 IMPLANT
DERMABOND ADVANCED .7 DNX12 (GAUZE/BANDAGES/DRESSINGS) ×2 IMPLANT
DRAIN CHANNEL 15F RND FF W/TCR (WOUND CARE) IMPLANT
DRAPE TOP ARMCOVERS (MISCELLANEOUS) ×1 IMPLANT
DRAPE U-SHAPE 76X120 STRL (DRAPES) ×1 IMPLANT
DRAPE UTILITY XL STRL (DRAPES) ×1 IMPLANT
ELECT BLADE 4.0 EZ CLEAN MEGAD (MISCELLANEOUS)
ELECT COATED BLADE 2.86 ST (ELECTRODE) ×1 IMPLANT
ELECT REM PT RETURN 9FT ADLT (ELECTROSURGICAL) ×1
ELECTRODE BLDE 4.0 EZ CLN MEGD (MISCELLANEOUS) IMPLANT
ELECTRODE REM PT RTRN 9FT ADLT (ELECTROSURGICAL) ×1 IMPLANT
EVACUATOR SILICONE 100CC (DRAIN) IMPLANT
GAUZE PAD ABD 8X10 STRL (GAUZE/BANDAGES/DRESSINGS) ×2 IMPLANT
GLOVE BIO SURGEON STRL SZ 6 (GLOVE) ×2 IMPLANT
GLOVE BIOGEL PI IND STRL 7.5 (GLOVE) IMPLANT
GOWN STRL REUS W/ TWL LRG LVL3 (GOWN DISPOSABLE) ×2 IMPLANT
GOWN STRL REUS W/TWL LRG LVL3 (GOWN DISPOSABLE) ×2
IMPL BREAST 700CC (Breast) IMPLANT
IMPLANT BREAST 700CC (Breast) ×2 IMPLANT
KIT FILL ASEPTIC TRANSFER (MISCELLANEOUS) IMPLANT
MARKER SKIN DUAL TIP RULER LAB (MISCELLANEOUS) IMPLANT
NDL FILTER BLUNT 18X1 1/2 (NEEDLE) IMPLANT
NDL HYPO 25X1 1.5 SAFETY (NEEDLE) IMPLANT
NEEDLE FILTER BLUNT 18X1 1/2 (NEEDLE) IMPLANT
NEEDLE HYPO 25X1 1.5 SAFETY (NEEDLE) ×1 IMPLANT
PACK BASIN DAY SURGERY FS (CUSTOM PROCEDURE TRAY) ×1 IMPLANT
PENCIL SMOKE EVACUATOR (MISCELLANEOUS) ×1 IMPLANT
PIN SAFETY STERILE (MISCELLANEOUS) IMPLANT
SHEET MEDIUM DRAPE 40X70 STRL (DRAPES) ×1 IMPLANT
SIZER BREAST 650CC SNGL USE (SIZER) ×1
SIZER BREAST 700CC SNGL USE (SIZER) ×1
SIZER BRST 650CC SNGL USE (SIZER) IMPLANT
SIZER BRST 700CC SNGL USE (SIZER) IMPLANT
SLEEVE SCD COMPRESS KNEE MED (STOCKING) ×1 IMPLANT
SPIKE FLUID TRANSFER (MISCELLANEOUS) IMPLANT
SPONGE T-LAP 18X18 ~~LOC~~+RFID (SPONGE) ×1 IMPLANT
STAPLER VISISTAT 35W (STAPLE) ×1 IMPLANT
SUT ETHILON 2 0 FS 18 (SUTURE) IMPLANT
SUT MNCRL AB 4-0 PS2 18 (SUTURE) IMPLANT
SUT PDS AB 2-0 CT2 27 (SUTURE) IMPLANT
SUT VIC AB 3-0 PS1 18 (SUTURE)
SUT VIC AB 3-0 PS1 18XBRD (SUTURE) IMPLANT
SUT VIC AB 3-0 SH 27 (SUTURE) ×2
SUT VIC AB 3-0 SH 27X BRD (SUTURE) IMPLANT
SUT VICRYL 4-0 PS2 18IN ABS (SUTURE) IMPLANT
SYR 20ML LL LF (SYRINGE) IMPLANT
SYR BULB IRRIG 60ML STRL (SYRINGE) ×2 IMPLANT
SYR CONTROL 10ML LL (SYRINGE) IMPLANT
TOWEL GREEN STERILE FF (TOWEL DISPOSABLE) ×2 IMPLANT
TUBE CONNECTING 20X1/4 (TUBING) ×1 IMPLANT
UNDERPAD 30X36 HEAVY ABSORB (UNDERPADS AND DIAPERS) ×2 IMPLANT
YANKAUER SUCT BULB TIP NO VENT (SUCTIONS) ×1 IMPLANT

## 2022-03-05 NOTE — Op Note (Signed)
Operative Note   DATE OF OPERATION: 10.10.23  LOCATION: [facility]-outpatient  SURGICAL DIVISION: Plastic Surgery  PREOPERATIVE DIAGNOSES:  1. History breast cancer 2. Acquired absence breasts  POSTOPERATIVE DIAGNOSES:  same  PROCEDURE:  Removal bilateral chest tissue expanders and placement silicone implants  SURGEON: Irene Limbo MD MBA  ASSISTANT: none  ANESTHESIA:  General.   EBL: 20 ml  COMPLICATIONS: None immediate.   INDICATIONS FOR PROCEDURE:  The patient, Susan Benton, is a 51 y.o. female born on 01-02-1971, is here for staged breast reconstruction following delayed prepectoral tissue expander based reconstruction.   FINDINGS: Natrelle Soft Touch Extra Projection smooth round 700 ml implants placed bilateral. REF SSX-700 RIGHT SN [id number] LEFT SN [id number]  DESCRIPTION OF PROCEDURE:  The patient's operative site was marked with the patient in the preoperative area. The patient was taken to the operating room. SCDs were placed and IV antibiotics were given. The patient's operative site was prepped and draped in a sterile fashion. A time out was performed and all information was confirmed to be correct.  Incision made in bilateral transverse mastectomy scars and carried through superficial fascia and ADM. Expanders removed. Over left chest superior and lateral capsulotomies performed. Over right chest superior and lateral capsulotomies performed. Sizer placed in each cavity. Patient assessed for symmetry. Extra projection 700 ml implant selected for bilateral chest placement.    Each cavity irrigated with saline solution containing Ancef gentamicin and Betadine. Hemostasis ensured. Local anesthetic infiltrated in each chest. Implant placed in right chest cavity. Implant orientation ensured. Closure completed with 3-0 vicryl to approximated capsule and superficial fascia, 4-0 vicryl in dermis, and 4-0 monocryl subcuticular skin closure. Over left chest, implant  placed. Implant orientation ensured. Closure completed with 3-0 vicryl to approximated capsule and superficial fascia, 4-0 vicryl in dermis, and 4-0 monocryl subcuticular skin closure. Dermabond applied to incisions followed by dry dressing, breast binder.  The patient was allowed to wake from anesthesia, extubated and taken to the recovery room in satisfactory condition.   SPECIMENS: none  DRAINS: none  Irene Limbo, MD [facility] Plastic [facility] Reconstructive Surgery  Office/ physician access line after hours [phone]

## 2022-03-05 NOTE — Interval H&P Note (Signed)
History and Physical Interval Note:  03/05/2022 6:49 AM  Susan Benton  has presented today for surgery, with the diagnosis of history breast cancer, acquired absence breasts.  The various methods of treatment have been discussed with the patient and family. After consideration of risks, benefits and other options for treatment, the patient has consented to  Procedure(s): REMOVAL OF BILATERAL TISSUE EXPANDERS WITH PLACEMENT OF BILATERAL BREAST IMPLANTS (Bilateral) as a surgical intervention.  The patient's history has been reviewed, patient examined, no change in status, stable for surgery.  I have reviewed the patient's chart and labs.  Questions were answered to the patient's satisfaction.     Arnoldo Hooker Zaine Elsass

## 2022-03-05 NOTE — Anesthesia Procedure Notes (Signed)
Procedure Name: LMA Insertion Date/Time: 03/05/2022 7:33 AM  Performed by: Glory Buff, CRNAPre-anesthesia Checklist: Patient identified, Emergency Drugs available, Suction available and Patient being monitored Patient Re-evaluated:Patient Re-evaluated prior to induction Oxygen Delivery Method: Circle system utilized Preoxygenation: Pre-oxygenation with 100% oxygen Induction Type: IV induction LMA: LMA inserted LMA Size: 4.0 Number of attempts: 1 Placement Confirmation: positive ETCO2 Tube secured with: Tape Dental Injury: Teeth and Oropharynx as per pre-operative assessment

## 2022-03-05 NOTE — Transfer of Care (Signed)
Immediate Anesthesia Transfer of Care Note  Patient: Susan Benton  Procedure(s) Performed: REMOVAL OF BILATERAL TISSUE EXPANDERS WITH PLACEMENT OF BILATERAL BREAST IMPLANTS (Bilateral: Chest)  Patient Location: PACU  Anesthesia Type:General  Level of Consciousness: drowsy and patient cooperative  Airway & Oxygen Therapy: Patient Spontanous Breathing and Patient connected to face mask oxygen  Post-op Assessment: Report given to RN and Post -op Vital signs reviewed and stable  Post vital signs: Reviewed and stable  Last Vitals:  Vitals Value Taken Time  BP    Temp    Pulse 70 03/05/22 0902  Resp    SpO2 100 % 03/05/22 0902  Vitals shown include unvalidated device data.  Last Pain:  Vitals:   03/05/22 0647  TempSrc: Oral  PainSc: 0-No pain      Patients Stated Pain Goal: 3 (16/74/25 5258)  Complications: No notable events documented.

## 2022-03-05 NOTE — Anesthesia Postprocedure Evaluation (Signed)
Anesthesia Post Note  Patient: Susan Benton  Procedure(s) Performed: REMOVAL OF BILATERAL TISSUE EXPANDERS WITH PLACEMENT OF BILATERAL BREAST IMPLANTS (Bilateral: Chest)     Patient location during evaluation: PACU Anesthesia Type: General Level of consciousness: awake and alert Pain management: pain level controlled Vital Signs Assessment: post-procedure vital signs reviewed and stable Respiratory status: spontaneous breathing, nonlabored ventilation, respiratory function stable and patient connected to nasal cannula oxygen Cardiovascular status: blood pressure returned to baseline and stable Postop Assessment: no apparent nausea or vomiting Anesthetic complications: no   No notable events documented.  Last Vitals:  Vitals:   03/05/22 0930 03/05/22 0945  BP: (!) 143/90 122/87  Pulse: 77 77  Resp: (!) 21 10  Temp:    SpO2: 100% 98%    Last Pain:  Vitals:   03/05/22 0952  TempSrc:   PainSc: 10-Worst pain ever                 Idris Edmundson

## 2022-03-05 NOTE — Discharge Instructions (Addendum)
No Tylenol or ibuprofen until after 12:50pm today if needed  Post Anesthesia Home Care Instructions  Activity: Get plenty of rest for the remainder of the day. A responsible individual must stay with you for 24 hours following the procedure.  For the next 24 hours, DO NOT: -Drive a car -Paediatric nurse -Drink alcoholic beverages -Take any medication unless instructed by your physician -Make any legal decisions or sign important papers.  Meals: Start with liquid foods such as gelatin or soup. Progress to regular foods as tolerated. Avoid greasy, spicy, heavy foods. If nausea and/or vomiting occur, drink only clear liquids until the nausea and/or vomiting subsides. Call your physician if vomiting continues.  Special Instructions/Symptoms: Your throat may feel dry or sore from the anesthesia or the breathing tube placed in your throat during surgery. If this causes discomfort, gargle with warm salt water. The discomfort should disappear within 24 hours.  If you had a scopolamine patch placed behind your ear for the management of post- operative nausea and/or vomiting:  1. The medication in the patch is effective for 72 hours, after which it should be removed.  Wrap patch in a tissue and discard in the trash. Wash hands thoroughly with soap and water. 2. You may remove the patch earlier than 72 hours if you experience unpleasant side effects which may include dry mouth, dizziness or visual disturbances. 3. Avoid touching the patch. Wash your hands with soap and water after contact with the patch.

## 2022-03-06 NOTE — Progress Notes (Signed)
Mailbox full

## 2022-03-11 ENCOUNTER — Encounter (HOSPITAL_BASED_OUTPATIENT_CLINIC_OR_DEPARTMENT_OTHER): Payer: Self-pay | Admitting: Plastic Surgery

## 2022-05-25 ENCOUNTER — Encounter: Payer: Self-pay | Admitting: Hematology

## 2022-06-04 ENCOUNTER — Other Ambulatory Visit: Payer: Self-pay | Admitting: Nurse Practitioner

## 2022-06-04 DIAGNOSIS — R7301 Impaired fasting glucose: Secondary | ICD-10-CM | POA: Diagnosis not present

## 2022-06-04 DIAGNOSIS — Z122 Encounter for screening for malignant neoplasm of respiratory organs: Secondary | ICD-10-CM

## 2022-06-04 DIAGNOSIS — K769 Liver disease, unspecified: Secondary | ICD-10-CM

## 2022-06-13 ENCOUNTER — Ambulatory Visit (HOSPITAL_COMMUNITY)
Admission: RE | Admit: 2022-06-13 | Discharge: 2022-06-13 | Disposition: A | Discharge status: Home / Self Care | Payer: BC Managed Care – PPO | Source: Ambulatory Visit | Attending: Nurse Practitioner | Admitting: Nurse Practitioner

## 2022-06-13 DIAGNOSIS — I700 Atherosclerosis of aorta: Secondary | ICD-10-CM | POA: Diagnosis not present

## 2022-06-13 DIAGNOSIS — I745 Embolism and thrombosis of iliac artery: Secondary | ICD-10-CM | POA: Diagnosis not present

## 2022-06-13 DIAGNOSIS — N281 Cyst of kidney, acquired: Secondary | ICD-10-CM | POA: Diagnosis not present

## 2022-06-13 DIAGNOSIS — K769 Liver disease, unspecified: Secondary | ICD-10-CM | POA: Diagnosis not present

## 2022-06-13 DIAGNOSIS — K760 Fatty (change of) liver, not elsewhere classified: Secondary | ICD-10-CM | POA: Diagnosis not present

## 2022-06-13 MED ORDER — GADOBUTROL 1 MMOL/ML IV SOLN
10.0000 mL | Freq: Once | INTRAVENOUS | Status: AC | PRN
  Administered 2022-06-13: 10 mL via INTRAVENOUS

## 2022-06-13 NOTE — Progress Notes (Signed)
[place]   [phone] [phone]) [phone]   Clinic Follow up Note   Patient Care Team: Celene Squibb, MD as PCP - General (Internal Medicine) Domingo Pulse, MD as Consulting Physician (Urology) Truitt Merle, MD as Consulting Physician (Hematology)  Date of Service:  06/17/2022  I connected with Susan Benton on 06/17/2022 at  8:00 AM EST by telephone visit and verified that I am speaking with the correct person using two identifiers.  I discussed the limitations, risks, security and privacy concerns of performing an evaluation and management service by telephone and the availability of in person appointments. I also discussed with the patient that there may be a patient responsible charge related to this service. The patient expressed understanding and agreed to proceed.   Other persons participating in the visit and their role in the encounter:  None   Patient's location:  Home  Provider's location:  Office   CHIEF COMPLAINT: f/u of of right breast cancer   CURRENT THERAPY:  Surveillance   ASSESSMENT & PLAN:  Susan Benton is a 52 y.o. 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 S/p bilateral mastectomies on 10/23/16 under Dr. Arnoldo Morale. Right breast path showed multifocal IDC, grade 1, 4.6 cm and 0.6 cm, and low grade DCIS. Left breast was benign. -oncotype recurrence score of 9 (low risk).  -Began tamoxifen around 02/2017. Tolerated moderately well with joint stiffness. She developed postmenopausal bleeding and underwent hysterectomy with BSO on 07/26/21. Tamoxifen discontinued at 11/19/21 visit.  -she declined AI -continue cancer surveillance   Steatosis of liver -CT CAP 10/30/21 showed: 2.4 cm subtle area in central liver, most likely focal fatty deposition.  -abdomen MRI 11/18/21 showed: background heterogenous steatosis; area  of concern on CT strongly favored to be benign area of focal hepatic steatosis; no definite suspicious hepatic lesions -repeated liver MRI on 06/13/2022 showed hepatomegaly with diffuse hepatic steatosis and multifocal areas of fatty sparing. Including a focus of fatty sparing along the interlobar fissures which corresponds with the abnormality seen on prior CT and MRI. No suspicious hepatic lesion. -I reviewed the recent liver MRI images, and discussed the findings with her.  I encouraged her to eat healthy, and watch her cholesterol.  Plan -I reviewed her recent liver MRI images, no evidence of malignancy.  She does have fatty liver, I encouraged her to eat healthy and control her cholesterol. -I will send my note and her liver MRI report to her primary care physician Dr. Nevada Crane, she needs additional workup for her fatigue and bodyaches. -I will call her after her screening CT chest for lung cancer in June   SUMMARY [place]: Oncology History  Malignant neoplasm of upper-outer quadrant of right female breast [facility])   Initial Diagnosis   Malignant neoplasm of upper-outer quadrant of right female breast ([place])   10/08/2016 Initial Biopsy   (R) breast needle biopsy (10:00): Invasive ductal carcinoma, grade 1-2, with DCIS.  ER+ (100%), PR+ (100%), Ki67 10%, HER2 neg (ratio 1.03).    10/23/2016 Surgery   Bilateral mastectomies Arnoldo Morale)   10/23/2016 Pathology Results   RIGHT mastectomy: Multifocal IDC, grade 1, spanning 4.6 cm & 0.6 cm. Low grade DCIS. Negative margins. HER2 repeated and neg (ratio 1.36).  LEFT mastectomy: Fibroadenoma, no malignancy.    11/20/2016 Surgery   (R) axillary sentinel lymph node biopsy Arnoldo Morale)   11/20/2016 Pathology  Results   0/1 axillary sentinel LN biopsy for carcinoma.    11/28/2016 Oncotype testing   Recurrence score: 9 (low risk); associated with 6% risk of distant recurrence.    12/03/2016 Imaging   CT chest: IMPRESSION: 1. 7.7 cm lobulated fluid  attenuation collection in the right axilla, could represent a postoperative fluid collection, with or without presence of infection. Further management will need to be based on clinical grounds. Could correlate with targeted sonography and aspiration as indicated. 2. Clear lung fields 3. Hepatic steatosis 4. Aortic Atherosclerosis   12/13/2016 Miscellaneous   BCI genomic testing revealed LOW risk of recurrence with 4.3% risk and LOW likelihood of benefit of extension of therapy beyond 5 years.       INTERVAL HISTORY:  Susan Benton was contacted for a follow up of of right breast cancer . She was last seen by me on 02/20/2022. She complains of persistent fatigue.  Her body aches and joint stiffness resolved after she came off tamoxifen, but it returned again after a few months.  She also complains of moderate to severe fatigue, and mild dyspnea on exertion.  She is seeing her primary care physician Dr. Nevada Crane lately, but hide is waiting for her liver MRI results.   All other systems were reviewed with the patient and are negative.  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[place])    after taking tamoxifan-on plavix and asa   PONV (postoperative nausea and vomiting)    Restless legs syndrome     SURGICAL HISTORY: Past Surgical History:  Procedure Laterality Date   AXILLARY SENTINEL NODE BIOPSY Right 11/20/2016   Procedure: AXILLARY SENTINEL NODE BIOPSY;  Surgeon: Aviva Signs, MD;  Location: [facility];  Service: General;  Laterality: Right;  Sentinel Node Injection @ 10:30am   BLADDER REPAIR   2022   BREAST RECONSTRUCTION WITH PLACEMENT OF TISSUE EXPANDER AND ALLODERM Bilateral 12/25/2021   Procedure: BREAST RECONSTRUCTION WITH PLACEMENT OF TISSUE EXPANDER AND ALLODERM;  Surgeon: Irene Limbo, MD;  Location: [place];  Service: Plastics;  Laterality: Bilateral;   BREAST RECONSTRUCTION WITH PLACEMENT OF TISSUE EXPANDER AND FLEX HD (ACELLULAR HYDRATED DERMIS) Bilateral 12/25/2016   Procedure: BREAST RECONSTRUCTION WITH PLACEMENT OF TISSUE EXPANDER AND FLEX HD (ACELLULAR HYDRATED DERMIS);  Surgeon: Wallace Going, DO;  Location: [place];  Service: Plastics;  Laterality: Bilateral;   CARDIAC CATHETERIZATION     CYSTOSCOPY  07/26/2021   Procedure: CYSTOSCOPY;  Surgeon: Gae Dry, MD;  Location: [facility];  Service: Gynecology;;   [place] N/A 10/23/2016   Procedure: DILATATION & CURETTAGE/HYSTEROSCOPY WITH NOVASURE ENDOMETRIAL ABLATION;  Surgeon: Florian Buff, MD;  Location: [facility];  Service: Gynecology;  Laterality: N/A;   KIDNEY CYST REMOVAL     LAPAROSCOPY N/A 11/08/2021   Procedure: LAPAROSCOPY DIAGNOSTIC;  Surgeon: Rubie Maid, MD;  Location: [facility];  Service: Gynecology;  Laterality: N/A;   MASTECTOMY MODIFIED RADICAL Right 10/23/2016   Procedure: MASTECTOMY MODIFIED RADICAL;  Surgeon: Aviva Signs, MD;  Location: [facility];  Service: General;  Laterality: Right;   REMOVAL OF BILATERAL TISSUE EXPANDERS WITH PLACEMENT OF BILATERAL BREAST IMPLANTS Bilateral 03/05/2022   Procedure: REMOVAL OF BILATERAL TISSUE EXPANDERS WITH PLACEMENT OF BILATERAL BREAST IMPLANTS;  Surgeon: Irene Limbo, MD;  Location: [place];  Service: Plastics;  Laterality: Bilateral;   REPAIR VAGINAL CUFF N/A 11/08/2021   Procedure: REPAIR VAGINAL CUFF;  Surgeon: Rubie Maid, MD;  Location: [facility];  Service: Gynecology;  Laterality: N/A;   ROTATOR CUFF REPAIR     SIMPLE MASTECTOMY WITH  AXILLARY SENTINEL NODE BIOPSY Left 10/23/2016   Procedure: SIMPLE MASTECTOMY;  Surgeon: Aviva Signs, MD;  Location: [facility];  Service: General;  Laterality: Left;   TISSUE EXPANDER PLACEMENT Bilateral 02/27/2017   Procedure: REMOVAL TISSUE EXPANDER;  Surgeon: Wallace Going, DO;  Location: [place];  Service: Plastics;  Laterality: Bilateral;   TONSILECTOMY, ADENOIDECTOMY, BILATERAL MYRINGOTOMY AND TUBES Bilateral 1984   TOTAL LAPAROSCOPIC HYSTERECTOMY WITH BILATERAL SALPINGO OOPHORECTOMY Bilateral 07/26/2021   Procedure: TOTAL LAPAROSCOPIC HYSTERECTOMY WITH BILATERAL SALPINGO OOPHORECTOMY;  Surgeon: Gae Dry, MD;  Location: [facility];  Service: Gynecology;  Laterality: Bilateral;   TUBAL LIGATION  1998   2000    I have reviewed the social history and family history with the patient and they are unchanged from previous note.  ALLERGIES:  is allergic to tramadol.  MEDICATIONS:  Current Outpatient Medications  Medication Sig Dispense Refill   allopurinol (ZYLOPRIM) 300 MG tablet Take 300 mg by mouth daily.     ALPRAZolam (XANAX) 0.5 MG tablet TAKE 1 TABLET BY MOUTH TWICE A DAY AS NEEDED FOR ANXIETY (Patient taking differently: Take 0.5 mg by mouth 2 (two) times daily.) 30 tablet 0   atenolol (TENORMIN) 100 MG tablet Take 1 tablet (100 mg total) by mouth daily. 30 tablet 1   cloNIDine (CATAPRES) 0.1 MG tablet Take 1 tablet (0.1 mg total) by mouth daily. 30 tablet 11   docusate sodium (COLACE) 100 MG capsule TAKE 1 CAPSULE BY MOUTH 2 TIMES DAILY AS NEEDED. 30 capsule 2   gabapentin (NEURONTIN) 300 MG capsule TAKE ONE CAPSULE BY MOUTH 3 TIMES A DAY (Patient taking differently: Take 300 mg by mouth daily as needed (Nerve pain).) 90 capsule 2   olmesartan (BENICAR) 40 MG tablet Take 40 mg by mouth daily.     ondansetron (ZOFRAN-ODT) 4 MG disintegrating tablet Take 1 tablet (4 mg total) by mouth every 6 (six) hours as needed for nausea. 20 tablet 0   oxyCODONE (ROXICODONE) 15 MG immediate  release tablet Take 15 mg by mouth every 4 (four) hours as needed for pain.     Potassium Citrate 15 MEQ (1620 MG) TBCR Take 1 tablet by mouth 2 (two) times daily.     rOPINIRole (REQUIP) 2 MG tablet 1-2 tabs po qhs (Patient taking differently: Take 2 mg by mouth daily as needed (Restless leg at bedtime).) 60 tablet 3   No current facility-administered medications for this visit.    PHYSICAL EXAMINATION: ECOG PERFORMANCE STATUS: 1 - Symptomatic but completely ambulatory  There were no vitals filed for this visit. Wt Readings from Last 3 Encounters:  03/05/22 217 lb 2.5 oz (98.5 kg)  02/20/22 213 lb 12.8 oz (97 kg)  02/07/22 208 lb 12.8 oz (94.7 kg)     No vitals taken today, Exam not performed today  LABORATORY DATA:  I have reviewed the data as listed    Latest Ref Rng &  Units 02/20/2022    2:06 PM 10/30/2021    1:33 PM 07/20/2021    2:24 PM  CBC  WBC 4.0 - 10.5 K/uL 7.0  10.4  9.9   Hemoglobin 12.0 - 15.0 g/dL 12.3  12.0  12.2   Hematocrit 36.0 - 46.0 % 36.8  35.0  37.6   Platelets 150 - 400 K/uL 337  314  320         Latest Ref Rng & Units 02/20/2022    2:06 PM 10/30/2021    1:33 PM 06/04/2021    4:07 PM  CMP  Glucose 70 - 99 mg/dL 118  146  101   BUN 6 - 20 mg/dL '18  17  28   '$ Creatinine 0.44 - 1.00 mg/dL 0.74  0.80  0.87   Sodium 135 - 145 mmol/L 137  137  136   Potassium 3.5 - 5.1 mmol/L 4.3  3.9  4.3   Chloride 98 - 111 mmol/L 103  105  104   CO2 22 - 32 mmol/L '26  25  22   '$ Calcium 8.9 - 10.3 mg/dL 9.3  9.3  9.4   Total Protein 6.5 - 8.1 g/dL 7.5  6.8  7.5   Total Bilirubin 0.3 - 1.2 mg/dL 0.5  0.3  0.3   Alkaline Phos 38 - 126 U/L 72  58  71   AST 15 - 41 U/L 27  30  32   ALT 0 - 44 U/L 22  45  46       RADIOGRAPHIC STUDIES: I have personally reviewed the radiological images as listed and agreed with the findings in the report. No results found.    No orders of the defined types were placed in this encounter.  All questions were answered. The patient  knows to call the clinic with any problems, questions or concerns. No barriers to learning was detected. The total time spent in the appointment was 15 minutes.     Truitt Merle, MD 06/17/2022   Felicity Coyer am acting as scribe for Truitt Merle, MD.   I have reviewed the above documentation for accuracy and completeness, and I agree with the above.

## 2022-06-14 ENCOUNTER — Other Ambulatory Visit: Payer: Self-pay

## 2022-06-14 DIAGNOSIS — E782 Mixed hyperlipidemia: Secondary | ICD-10-CM | POA: Diagnosis not present

## 2022-06-14 DIAGNOSIS — Z853 Personal history of malignant neoplasm of breast: Secondary | ICD-10-CM | POA: Diagnosis not present

## 2022-06-14 DIAGNOSIS — N202 Calculus of kidney with calculus of ureter: Secondary | ICD-10-CM | POA: Diagnosis not present

## 2022-06-14 DIAGNOSIS — R232 Flushing: Secondary | ICD-10-CM

## 2022-06-14 DIAGNOSIS — N939 Abnormal uterine and vaginal bleeding, unspecified: Secondary | ICD-10-CM | POA: Diagnosis not present

## 2022-06-14 DIAGNOSIS — G894 Chronic pain syndrome: Secondary | ICD-10-CM | POA: Diagnosis not present

## 2022-06-14 MED ORDER — CLONIDINE HCL 0.1 MG PO TABS: 0.1000 mg | ORAL_TABLET | Freq: Every day | ORAL | 11 refills | Status: AC

## 2022-06-16 DIAGNOSIS — K760 Fatty (change of) liver, not elsewhere classified: Secondary | ICD-10-CM | POA: Insufficient documentation

## 2022-06-16 NOTE — Assessment & Plan Note (Signed)
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 S/p bilateral mastectomies on 10/23/16 under Dr. Arnoldo Morale. Right breast path showed multifocal IDC, grade 1, 4.6 cm and 0.6 cm, and low grade DCIS. Left breast was benign. -oncotype recurrence score of 9 (low risk).  -Began tamoxifen around 02/2017. Tolerated moderately well with joint stiffness. She developed postmenopausal bleeding and underwent hysterectomy with BSO on 07/26/21. Tamoxifen discontinued at 11/19/21 visit.  -she declined AI -continue cancer surveillance

## 2022-06-16 NOTE — Assessment & Plan Note (Signed)
-  CT CAP 10/30/21 showed: 2.4 cm subtle area in central liver, most likely focal fatty deposition.  -abdomen MRI 11/18/21 showed: background heterogenous steatosis; area of concern on CT strongly favored to be benign area of focal hepatic steatosis; no definite suspicious hepatic lesions -repeated liver MRI on 06/13/2022 showed hepatomegaly with diffuse hepatic steatosis and multifocal areas of fatty sparing. Including a focus of fatty sparing along the interlobar fissures which corresponds with the abnormality seen on prior CT and MRI. No suspicious hepatic lesion. -I reviewed the recent liver MRI images, and discussed the findings with her.  I encouraged her to eat healthy, and watch her cholesterol.

## 2022-06-17 ENCOUNTER — Encounter: Payer: Self-pay | Admitting: Hematology

## 2022-06-17 ENCOUNTER — Inpatient Hospital Stay: Payer: BC Managed Care – PPO | Attending: Hematology | Admitting: Hematology

## 2022-06-17 DIAGNOSIS — C50411 Malignant neoplasm of upper-outer quadrant of right female breast: Secondary | ICD-10-CM | POA: Diagnosis not present

## 2022-06-17 DIAGNOSIS — Z170 Estrogen receptor positive status [ER+]: Secondary | ICD-10-CM

## 2022-06-17 DIAGNOSIS — K760 Fatty (change of) liver, not elsewhere classified: Secondary | ICD-10-CM

## 2022-10-31 ENCOUNTER — Other Ambulatory Visit: Payer: Self-pay

## 2022-10-31 DIAGNOSIS — C50411 Malignant neoplasm of upper-outer quadrant of right female breast: Secondary | ICD-10-CM

## 2022-10-31 NOTE — Progress Notes (Deleted)
[facility]   Telephone:(336) [phone] Fax:(336) [phone]   Clinic Follow up Note   Patient Care Team: Benita Stabile, MD as PCP - General (Internal Medicine) Jamison Neighbor, MD as Consulting Physician (Urology) Malachy Mood, MD as Consulting Physician (Hematology)  Date of Service:  10/31/2022  CHIEF COMPLAINT: f/u of right breast cancer   CURRENT THERAPY:  Surveillance  ASSESSMENT: *** Susan Benton is a 52 y.o. female with   No problem-specific Assessment & Plan notes found for this encounter.  ***   PLAN: {Everything Dr. Mosetta Putt talks to pt about, including reviewing scans and labs. } -{proceed with ***} -{lab with/without flush and f/u when?}   SUMMARY OF ONCOLOGIC HISTORY: Oncology History  Malignant neoplasm of upper-outer quadrant of right female breast [facility])   Initial Diagnosis   Malignant neoplasm of upper-outer quadrant of right female breast (HCC)   10/08/2016 Initial Biopsy   (R) breast needle biopsy (10:00): Invasive ductal carcinoma, grade 1-2, with DCIS.  ER+ (100%), PR+ (100%), Ki67 10%, HER2 neg (ratio 1.03).    10/23/2016 Surgery   Bilateral mastectomies Lovell Sheehan)   10/23/2016 Pathology Results   RIGHT mastectomy: Multifocal IDC, grade 1, spanning 4.6 cm & 0.6 cm. Low grade DCIS. Negative margins. HER2 repeated and neg (ratio 1.36).  LEFT mastectomy: Fibroadenoma, no malignancy.    11/20/2016 Surgery   (R) axillary sentinel lymph node biopsy Lovell Sheehan)   11/20/2016 Pathology Results   0/1 axillary sentinel LN biopsy for carcinoma.    11/28/2016 Oncotype testing   Recurrence score: 9 (low risk); associated with 6% risk of distant recurrence.    12/03/2016 Imaging   CT chest: IMPRESSION: 1. 7.7 cm lobulated fluid attenuation collection in the right axilla, could represent a postoperative fluid collection, with or without presence of infection. Further management will need to be based on clinical grounds. Could correlate with targeted  sonography and aspiration as indicated. 2. Clear lung fields 3. Hepatic steatosis 4. Aortic Atherosclerosis   12/13/2016 Miscellaneous   BCI genomic testing revealed LOW risk of recurrence with 4.3% risk and LOW likelihood of benefit of extension of therapy beyond 5 years.        INTERVAL HISTORY: *** Susan Benton is here for a follow up of right breast cancer. She was last seen by me on 06/17/2022. She presents to the clinic      All other systems were reviewed with the patient and are negative.  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   Low iron    Mesenteric thrombosis (HCC)    after taking tamoxifan-on plavix and asa   PONV (postoperative nausea and vomiting)    Restless legs syndrome     SURGICAL HISTORY: Past Surgical History:  Procedure Laterality Date   AXILLARY SENTINEL NODE BIOPSY Right 11/20/2016   Procedure: AXILLARY SENTINEL NODE BIOPSY;  Surgeon: Franky Macho, MD;  Location: [facility];  Service: General;  Laterality: Right;  Sentinel Node Injection @ 10:30am   BLADDER REPAIR  2022   BREAST RECONSTRUCTION WITH PLACEMENT OF TISSUE EXPANDER AND ALLODERM Bilateral  12/25/2021   Procedure: BREAST RECONSTRUCTION WITH PLACEMENT OF TISSUE EXPANDER AND ALLODERM;  Surgeon: Glenna Fellows, MD;  Location: [facility];  Service: Plastics;  Laterality: Bilateral;   BREAST RECONSTRUCTION WITH PLACEMENT OF TISSUE EXPANDER AND FLEX HD (ACELLULAR HYDRATED DERMIS) Bilateral 12/25/2016   Procedure: BREAST RECONSTRUCTION WITH PLACEMENT OF TISSUE EXPANDER AND FLEX HD (ACELLULAR HYDRATED DERMIS);  Surgeon:  Peggye Form, DO;  Location: [facility];  Service: Plastics;  Laterality: Bilateral;   CARDIAC CATHETERIZATION     CYSTOSCOPY  07/26/2021   Procedure: CYSTOSCOPY;  Surgeon: Nadara Mustard, MD;  Location: [facility];  Service: Gynecology;;   DILITATION & CURRETTAGE/HYSTROSCOPY WITH NOVASURE ABLATION N/A 10/23/2016   Procedure: DILATATION & CURETTAGE/HYSTEROSCOPY WITH NOVASURE ENDOMETRIAL ABLATION;  Surgeon: Lazaro Arms, MD;  [facility]: [facility];  Service: Gynecology;  Laterality: N/A;   KIDNEY CYST REMOVAL     LAPAROSCOPY N/A 11/08/2021   Procedure: LAPAROSCOPY DIAGNOSTIC;  Surgeon: Hildred Laser, MD;  Location: [facility];  Service: Gynecology;  Laterality: N/A;   MASTECTOMY MODIFIED RADICAL Right 10/23/2016   Procedure: MASTECTOMY MODIFIED RADICAL;  Surgeon: Franky Macho, MD;  [facility]: [facility];  Service: General;  Laterality: Right;   REMOVAL OF BILATERAL TISSUE EXPANDERS WITH PLACEMENT OF BILATERAL BREAST IMPLANTS Bilateral 03/05/2022   Procedure: REMOVAL OF BILATERAL TISSUE EXPANDERS WITH PLACEMENT OF BILATERAL BREAST IMPLANTS;  Surgeon: Glenna Fellows, MD;  Location: [facility];  Service: Plastics;  Laterality: Bilateral;   REPAIR VAGINAL CUFF N/A 11/08/2021   Procedure: REPAIR VAGINAL CUFF;  Surgeon: Hildred Laser, MD;  Location: [facility];  Service: Gynecology;  Laterality: N/A;   ROTATOR CUFF REPAIR     SIMPLE MASTECTOMY WITH AXILLARY SENTINEL NODE BIOPSY Left 10/23/2016   Procedure: SIMPLE MASTECTOMY;  Surgeon: Franky Macho, MD;  Location: [facility];  Service: General;  Laterality: Left;   TISSUE EXPANDER PLACEMENT Bilateral 02/27/2017   Procedure: REMOVAL TISSUE EXPANDER;  Surgeon: Peggye Form, DO;  Location: [facility];  Service: Plastics;  Laterality: Bilateral;   TONSILECTOMY, ADENOIDECTOMY, BILATERAL MYRINGOTOMY AND TUBES Bilateral 1984   TOTAL LAPAROSCOPIC HYSTERECTOMY WITH BILATERAL SALPINGO OOPHORECTOMY Bilateral 07/26/2021   Procedure: TOTAL  LAPAROSCOPIC HYSTERECTOMY WITH BILATERAL SALPINGO OOPHORECTOMY;  Surgeon: Nadara Mustard, MD;  Location: [facility];  Service: Gynecology;  Laterality: Bilateral;   TUBAL LIGATION  1998   2000    I have reviewed the social history and family history with the patient and they are unchanged from previous note.  ALLERGIES:  is allergic to tramadol.  MEDICATIONS:  Current Outpatient Medications  Medication Sig Dispense Refill   allopurinol (ZYLOPRIM) 300 MG tablet Take 300 mg by mouth daily.     ALPRAZolam (XANAX) 0.5 MG tablet TAKE 1 TABLET BY MOUTH TWICE A DAY AS NEEDED FOR ANXIETY (Patient taking differently: Take 0.5 mg by mouth 2 (two) times daily.) 30 tablet 0   atenolol (TENORMIN) 100 MG tablet Take 1 tablet (100 mg total) by mouth daily. 30 tablet 1   cloNIDine (CATAPRES) 0.1 MG tablet Take 1 tablet (0.1 mg total) by mouth daily. 30 tablet 11   docusate sodium (COLACE) 100 MG capsule TAKE 1 CAPSULE BY MOUTH 2 TIMES DAILY AS NEEDED. 30 capsule 2   gabapentin (NEURONTIN) 300 MG capsule TAKE ONE CAPSULE BY MOUTH 3 TIMES A DAY (Patient taking differently: Take 300 mg by mouth daily as needed (Nerve pain).) 90 capsule 2   olmesartan (BENICAR) 40 MG tablet Take 40 mg by mouth daily.  ondansetron (ZOFRAN-ODT) 4 MG disintegrating tablet Take 1 tablet (4 mg total) by mouth every 6 (six) hours as needed for nausea. 20 tablet 0   oxyCODONE (ROXICODONE) 15 MG immediate release tablet Take 15 mg by mouth every 4 (four) hours as needed for pain.     Potassium Citrate 15 MEQ (1620 MG) TBCR Take 1 tablet by mouth 2 (two) times daily.     rOPINIRole (REQUIP) 2 MG tablet 1-2 tabs po qhs (Patient taking differently: Take 2 mg by mouth daily as needed (Restless leg at bedtime).) 60 tablet 3   No current facility-administered medications for this visit.    PHYSICAL EXAMINATION: ECOG PERFORMANCE STATUS: {CHL ONC ECOG [phone]}  There were no vitals filed for this visit. Wt Readings from Last 3  Encounters:  03/05/22 217 lb 2.5 oz (98.5 kg)  02/20/22 213 lb 12.8 oz (97 kg)  02/07/22 208 lb 12.8 oz (94.7 kg)    {Only keep what was examined. If exam not performed, can use .CEXAM } GENERAL:alert, no distress and comfortable SKIN: skin color, texture, turgor are normal, no rashes or significant lesions EYES: normal, Conjunctiva are pink and non-injected, sclera clear {OROPHARYNX:no exudate, no erythema and lips, buccal mucosa, and tongue normal}  NECK: supple, thyroid normal size, non-tender, without nodularity LYMPH:  no palpable lymphadenopathy in the cervical, axillary {or inguinal} LUNGS: clear to auscultation and percussion with normal breathing effort HEART: regular rate & rhythm and no murmurs and no lower extremity edema ABDOMEN:abdomen soft, non-tender and normal bowel sounds Musculoskeletal:no cyanosis of digits and no clubbing  NEURO: alert & oriented x 3 with fluent speech, no focal motor/sensory deficits  LABORATORY DATA:  I have reviewed the data as listed    Latest Ref Rng & Units 02/20/2022    2:06 PM 10/30/2021    1:33 PM 07/20/2021    2:24 PM  CBC  WBC 4.0 - 10.5 K/uL 7.0  10.4  9.9   Hemoglobin 12.0 - 15.0 g/dL 16.1  09.6  04.5   Hematocrit 36.0 - 46.0 % 36.8  35.0  37.6   Platelets 150 - 400 K/uL 337  314  320         Latest Ref Rng & Units 02/20/2022    2:06 PM 10/30/2021    1:33 PM 06/04/2021    4:07 PM  CMP  Glucose 70 - 99 mg/dL 409  811  914   BUN 6 - 20 mg/dL 18  17  28    Creatinine 0.44 - 1.00 mg/dL 7.82  9.56  2.13   Sodium 135 - 145 mmol/L 137  137  136   Potassium 3.5 - 5.1 mmol/L 4.3  3.9  4.3   Chloride 98 - 111 mmol/L 103  105  104   CO2 22 - 32 mmol/L 26  25  22    Calcium 8.9 - 10.3 mg/dL 9.3  9.3  9.4   Total Protein 6.5 - 8.1 g/dL 7.5  6.8  7.5   Total Bilirubin 0.3 - 1.2 mg/dL 0.5  0.3  0.3   Alkaline Phos 38 - 126 U/L 72  58  71   AST 15 - 41 U/L 27  30  32   ALT 0 - 44 U/L 22  45  46       RADIOGRAPHIC STUDIES: I have  personally reviewed the radiological images as listed and agreed with the findings in the report. No results found.    No orders of the defined types were placed  in this encounter.  All questions were answered. The patient knows to call the clinic with any problems, questions or concerns. No barriers to learning was detected. The total time spent in the appointment was {CHL ONC TIME VISIT - [id number]}.     Salome Holmes, CMA 10/31/2022   I, Monica Martinez, CMA, am acting as scribe for Malachy Mood, MD.   {Add scribe attestation statement}

## 2022-11-01 ENCOUNTER — Inpatient Hospital Stay: Payer: BC Managed Care – PPO | Admitting: Hematology

## 2022-11-01 ENCOUNTER — Inpatient Hospital Stay: Payer: BC Managed Care – PPO | Attending: Internal Medicine

## 2022-11-01 ENCOUNTER — Telehealth: Payer: Self-pay

## 2022-11-01 NOTE — Telephone Encounter (Signed)
I called the pt to make them aware of the appointment they had today. I was unable to leave voicemail.  Rondel Jumbo, CMA

## 2022-11-13 ENCOUNTER — Inpatient Hospital Stay: Payer: BC Managed Care – PPO | Admitting: Hematology

## 2022-12-02 ENCOUNTER — Ambulatory Visit (HOSPITAL_COMMUNITY): Admission: RE | Admit: 2022-12-02 | Payer: BC Managed Care – PPO | Source: Ambulatory Visit

## 2022-12-04 ENCOUNTER — Inpatient Hospital Stay: Payer: Self-pay | Admitting: Hematology

## 2022-12-04 NOTE — Assessment & Plan Note (Deleted)
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 S/p bilateral mastectomies on 10/23/16 under Dr. Jenkins. Right breast path showed multifocal IDC, grade 1, 4.6 cm and 0.6 cm, and low grade DCIS. Left breast was benign. -oncotype recurrence score of 9 (low risk).  -Began tamoxifen around 02/2017. Tolerated moderately well with joint stiffness. She developed postmenopausal bleeding and underwent hysterectomy with BSO on 07/26/21. Tamoxifen discontinued at 11/19/21 visit.  -she declined AI -continue cancer surveillance  

## 2022-12-09 ENCOUNTER — Ambulatory Visit (HOSPITAL_COMMUNITY)
Admission: RE | Admit: 2022-12-09 | Discharge: 2022-12-09 | Disposition: A | Discharge status: Home / Self Care | Payer: Self-pay | Source: Ambulatory Visit | Attending: Nurse Practitioner | Admitting: Nurse Practitioner

## 2022-12-09 ENCOUNTER — Other Ambulatory Visit: Payer: Self-pay | Admitting: Nurse Practitioner

## 2022-12-09 ENCOUNTER — Inpatient Hospital Stay: Payer: Self-pay | Attending: Hematology

## 2022-12-09 ENCOUNTER — Encounter (HOSPITAL_COMMUNITY): Payer: Self-pay

## 2022-12-09 DIAGNOSIS — Z122 Encounter for screening for malignant neoplasm of respiratory organs: Secondary | ICD-10-CM

## 2022-12-09 DIAGNOSIS — K769 Liver disease, unspecified: Secondary | ICD-10-CM

## 2022-12-12 ENCOUNTER — Inpatient Hospital Stay (HOSPITAL_BASED_OUTPATIENT_CLINIC_OR_DEPARTMENT_OTHER): Payer: Self-pay | Admitting: Hematology

## 2022-12-12 ENCOUNTER — Encounter: Payer: Self-pay | Admitting: Hematology

## 2022-12-12 DIAGNOSIS — C50411 Malignant neoplasm of upper-outer quadrant of right female breast: Secondary | ICD-10-CM

## 2022-12-12 DIAGNOSIS — Z170 Estrogen receptor positive status [ER+]: Secondary | ICD-10-CM

## 2022-12-12 NOTE — Progress Notes (Signed)
[facility]   Telephone:(336) [phone] Fax:(336) [phone]   Clinic Follow up Note   Patient Care Team: Benita Stabile, MD as PCP - General (Internal Medicine) Jamison Neighbor, MD as Consulting Physician (Urology) Malachy Mood, MD as Consulting Physician (Hematology)  Date of Service:  12/12/2022  I connected with Susan Benton on 12/12/2022 at  8:40 AM EDT by telephone visit and verified that I am speaking with the correct person using two identifiers.  I discussed the limitations, risks, security and privacy concerns of performing an evaluation and management service by telephone and the availability of in person appointments. I also discussed with the patient that there may be a patient responsible charge related to this service. The patient expressed understanding and agreed to proceed.   Other persons participating in the visit and their role in the encounter:  No  Patient's location:  Home Provider's location:  [facility] Office  CHIEF COMPLAINT: f/u of  right breast cancer     CURRENT THERAPY:  Surveillance   ASSESSMENT & PLAN:  Susan Benton is a 52 y.o. female with   Malignant neoplasm of upper-outer quadrant of right female breast (HCC) -Stage Ia right breast IDC, ER/PR+, HER2-, RS 9 -presented with right breast pain. S/p bilateral mastectomies on 10/23/16 under Dr. Lovell Sheehan. Right breast path showed multifocal IDC, grade 1, 4.6 cm and 0.6 cm, and low grade DCIS. Left breast was benign. -oncotype recurrence score of 9 (low risk).  -Began tamoxifen around 02/2017. Tolerated moderately well with joint stiffness. She developed postmenopausal bleeding and underwent hysterectomy with BSO on 07/26/21. Tamoxifen discontinued at 11/19/21 visit.  -she declined AI -continue cancer surveillance    Steatosis of liver -CT CAP 10/30/21 showed: 2.4 cm subtle area in central liver, most likely focal fatty deposition.  -abdomen MRI 11/18/21 showed: background heterogenous steatosis;  area of concern on CT strongly favored to be benign area of focal hepatic steatosis; no definite suspicious hepatic lesions -repeated liver MRI on 06/13/2022 showed hepatomegaly with diffuse hepatic steatosis and multifocal areas of fatty sparing. Including a focus of fatty sparing along the interlobar fissures which corresponds with the abnormality seen on prior CT and MRI. No suspicious hepatic lesion. -Her multiple images confirmed liver steatosis. I encouraged her to eat healthy, and watch her cholesterol.  Lung cancer screening -I personally reviewed her screening CT scan from last week, which was negative for lung cancer -She has quit smoking, will continue screening for her once a year.  PLAN: Lab and f/u in 1 year   SUMMARY OF ONCOLOGIC HISTORY: Oncology History Overview Note   Cancer Staging  Malignant neoplasm of upper-outer quadrant of right female breast [facility]) Staging form: Breast, AJCC 8th Edition - Pathologic stage from 11/20/2016: Stage IA (pT2(m), pN0, cM0, G1, ER+, PR+, HER2-, Oncotype DX score: 9) - Signed by Hubbard Hartshorn, NP on 12/17/2016 Neoadjuvant therapy: No Multigene prognostic tests performed: Oncotype DX Recurrence score range: Less than 11 Histologic grading system: 3 grade system Laterality: Right Multiple tumors: Yes Stage used in treatment planning: Yes National guidelines used in treatment planning: Yes Type of national guideline used in treatment planning: NCCN     Malignant neoplasm of upper-outer quadrant of right female breast (HCC)   Initial Diagnosis   Malignant neoplasm of upper-outer quadrant of right female breast (HCC)   10/08/2016 Initial Biopsy   (R) breast needle biopsy (10:00): Invasive ductal carcinoma, grade 1-2, with DCIS.  ER+ (100%), PR+ (100%), Ki67 10%, HER2 neg (ratio  1.03).    10/23/2016 Surgery   Bilateral mastectomies Lovell Sheehan)   10/23/2016 Pathology Results   RIGHT mastectomy: Multifocal IDC, grade 1, spanning 4.6 cm &  0.6 cm. Low grade DCIS. Negative margins. HER2 repeated and neg (ratio 1.36).  LEFT mastectomy: Fibroadenoma, no malignancy.    11/20/2016 Surgery   (R) axillary sentinel lymph node biopsy Lovell Sheehan)   11/20/2016 Pathology Results   0/1 axillary sentinel LN biopsy for carcinoma.    11/28/2016 Oncotype testing   Recurrence score: 9 (low risk); associated with 6% risk of distant recurrence.    12/03/2016 Imaging   CT chest: IMPRESSION: 1. 7.7 cm lobulated fluid attenuation collection in the right axilla, could represent a postoperative fluid collection, with or without presence of infection. Further management will need to be based on clinical grounds. Could correlate with targeted sonography and aspiration as indicated. 2. Clear lung fields 3. Hepatic steatosis 4. Aortic Atherosclerosis   12/13/2016 Miscellaneous   BCI genomic testing revealed LOW risk of recurrence with 4.3% risk and LOW likelihood of benefit of extension of therapy beyond 5 years.     12/09/2022 Imaging    IMPRESSION: 1. No suspicious appearing pulmonary nodules or other findings to suggest metastatic disease to the chest. 2. Aortic atherosclerosis, in addition to three-vessel coronary artery disease. Please note that although the presence of coronary artery calcium documents the presence of coronary artery disease, the severity of this disease and any potential stenosis cannot be assessed on this non-gated CT examination. Assessment for potential risk factor modification, dietary therapy or pharmacologic therapy may be warranted, if clinically indicated. 3. Multiple new healing nondisplaced fractures of the anterolateral right third through sixth ribs. 4. Hepatic steatosis.      INTERVAL HISTORY:  Susan Benton was contacted for a follow up of  right breast cancer .She was last seen by me on 06/17/2022. Pt state that she is fatigue , but overall she is clinically doing well. Pt report that she had try to  commit suicide after the death of her son. She is attending counseling and id on antidepressants.   All other systems were reviewed with the patient and are negative.  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   Low iron    Mesenteric thrombosis (HCC)    after taking tamoxifan-on plavix and asa   PONV (postoperative nausea and vomiting)    Restless legs syndrome     SURGICAL HISTORY: Past Surgical History:  Procedure Laterality Date   AXILLARY SENTINEL NODE BIOPSY Right 11/20/2016   Procedure: AXILLARY SENTINEL NODE BIOPSY;  Surgeon: Franky Macho, MD;  Location: [facility];  Service: General;  Laterality: Right;  Sentinel Node Injection @ 10:30am   BLADDER REPAIR  2022   BREAST RECONSTRUCTION WITH PLACEMENT OF TISSUE EXPANDER AND ALLODERM Bilateral 12/25/2021   Procedure: BREAST RECONSTRUCTION WITH PLACEMENT OF TISSUE EXPANDER AND ALLODERM;  Surgeon: Glenna Fellows, MD;  Location: [facility];  Service: Plastics;  Laterality: Bilateral;   BREAST RECONSTRUCTION WITH PLACEMENT OF TISSUE  EXPANDER AND FLEX HD (ACELLULAR HYDRATED DERMIS) Bilateral 12/25/2016   Procedure: BREAST RECONSTRUCTION WITH PLACEMENT OF TISSUE EXPANDER AND FLEX HD (ACELLULAR HYDRATED DERMIS);  Surgeon: Peggye Form, DO;  Location: [facility];  Service: Plastics;  Laterality: Bilateral;   CARDIAC CATHETERIZATION     CYSTOSCOPY  07/26/2021   Procedure: CYSTOSCOPY;  Surgeon: Nadara Mustard, MD;  Location: [facility];  Service: Gynecology;;   DILITATION & CURRETTAGE/HYSTROSCOPY WITH NOVASURE  ABLATION N/A 10/23/2016   Procedure: DILATATION & CURETTAGE/HYSTEROSCOPY WITH NOVASURE ENDOMETRIAL ABLATION;  Surgeon: Lazaro Arms, MD;  Location: [facility];  Service: Gynecology;  Laterality: N/A;   KIDNEY CYST REMOVAL     LAPAROSCOPY N/A 11/08/2021   Procedure: LAPAROSCOPY DIAGNOSTIC;  Surgeon: Hildred Laser, MD;  Location: [facility];  Service: Gynecology;  Laterality: N/A;   MASTECTOMY MODIFIED RADICAL Right 10/23/2016   Procedure: MASTECTOMY MODIFIED RADICAL;  Surgeon: Franky Macho, MD;  Location: [facility];  Service: General;  Laterality: Right;   REMOVAL OF BILATERAL TISSUE EXPANDERS WITH PLACEMENT OF BILATERAL BREAST IMPLANTS Bilateral 03/05/2022   Procedure: REMOVAL OF BILATERAL TISSUE EXPANDERS WITH PLACEMENT OF BILATERAL BREAST IMPLANTS;  Surgeon: Glenna Fellows, MD;  Location: [facility];  Service: Plastics;  Laterality: Bilateral;   REPAIR VAGINAL CUFF N/A 11/08/2021   Procedure: REPAIR VAGINAL CUFF;  Surgeon: Hildred Laser, MD;  Location: [facility];  Service: Gynecology;  Laterality: N/A;   ROTATOR CUFF REPAIR     SIMPLE MASTECTOMY WITH AXILLARY SENTINEL NODE BIOPSY Left 10/23/2016   Procedure: SIMPLE MASTECTOMY;  Surgeon: Franky Macho, MD;  Location: [facility];  Service: General;  Laterality: Left;   TISSUE EXPANDER PLACEMENT Bilateral 02/27/2017   Procedure: REMOVAL TISSUE EXPANDER;  Surgeon: Peggye Form, DO;  Location: [facility];  Service: Plastics;  Laterality: Bilateral;   TONSILECTOMY, ADENOIDECTOMY, BILATERAL MYRINGOTOMY AND TUBES Bilateral 1984   TOTAL LAPAROSCOPIC HYSTERECTOMY WITH BILATERAL SALPINGO OOPHORECTOMY Bilateral 07/26/2021   Procedure: TOTAL LAPAROSCOPIC HYSTERECTOMY WITH BILATERAL SALPINGO OOPHORECTOMY;  Surgeon: Nadara Mustard, MD;  Location: [facility];  Service: Gynecology;  Laterality: Bilateral;   TUBAL LIGATION  1998   2000    I have reviewed the social history and family history with the patient and they are unchanged from previous  note.  ALLERGIES:  is allergic to tramadol.  MEDICATIONS:  Current Outpatient Medications  Medication Sig Dispense Refill   allopurinol (ZYLOPRIM) 300 MG tablet Take 300 mg by mouth daily.     ALPRAZolam (XANAX) 0.5 MG tablet TAKE 1 TABLET BY MOUTH TWICE A DAY AS NEEDED FOR ANXIETY (Patient taking differently: Take 0.5 mg by mouth 2 (two) times daily.) 30 tablet 0   atenolol (TENORMIN) 100 MG tablet Take 1 tablet (100 mg total) by mouth daily. 30 tablet 1   cloNIDine (CATAPRES) 0.1 MG tablet Take 1 tablet (0.1 mg total) by mouth daily. 30 tablet 11   docusate sodium (COLACE) 100 MG capsule TAKE 1 CAPSULE BY MOUTH 2 TIMES DAILY AS NEEDED. 30 capsule 2   gabapentin (NEURONTIN) 300 MG capsule TAKE ONE CAPSULE BY MOUTH 3 TIMES A DAY (Patient taking differently: Take 300 mg by mouth daily as needed (Nerve pain).) 90 capsule 2   olmesartan (BENICAR) 40 MG tablet Take 40 mg by mouth daily.     ondansetron (ZOFRAN-ODT) 4 MG disintegrating tablet Take 1 tablet (4 mg total) by mouth every 6 (six) hours as needed for nausea. 20 tablet 0   oxyCODONE (ROXICODONE) 15 MG immediate release tablet Take 15 mg  by mouth every 4 (four) hours as needed for pain.     Potassium Citrate 15 MEQ (1620 MG) TBCR Take 1 tablet by mouth 2 (two) times daily.     rOPINIRole (REQUIP) 2 MG tablet 1-2 tabs po qhs (Patient taking differently: Take 2 mg by mouth daily as needed (Restless leg at bedtime).) 60 tablet 3   No current facility-administered medications for this visit.    PHYSICAL EXAMINATION: ECOG PERFORMANCE STATUS: 0 - Asymptomatic  There were no vitals filed for this visit. Wt Readings from Last 3 Encounters:  03/05/22 217 lb 2.5 oz (98.5 kg)  02/20/22 213 lb 12.8 oz (97 kg)  02/07/22 208 lb 12.8 oz (94.7 kg)     No vitals taken today, Exam not performed today  LABORATORY DATA:  I have reviewed the data as listed    Latest Ref Rng & Units 02/20/2022    2:06 PM 10/30/2021    1:33 PM 07/20/2021    2:24 PM   CBC  WBC 4.0 - 10.5 K/uL 7.0  10.4  9.9   Hemoglobin 12.0 - 15.0 g/dL 40.9  81.1  91.4   Hematocrit 36.0 - 46.0 % 36.8  35.0  37.6   Platelets 150 - 400 K/uL 337  314  320         Latest Ref Rng & Units 02/20/2022    2:06 PM 10/30/2021    1:33 PM 06/04/2021    4:07 PM  CMP  Glucose 70 - 99 mg/dL 782  956  213   BUN 6 - 20 mg/dL 18  17  28    Creatinine 0.44 - 1.00 mg/dL 0.86  5.78  4.69   Sodium 135 - 145 mmol/L 137  137  136   Potassium 3.5 - 5.1 mmol/L 4.3  3.9  4.3   Chloride 98 - 111 mmol/L 103  105  104   CO2 22 - 32 mmol/L 26  25  22    Calcium 8.9 - 10.3 mg/dL 9.3  9.3  9.4   Total Protein 6.5 - 8.1 g/dL 7.5  6.8  7.5   Total Bilirubin 0.3 - 1.2 mg/dL 0.5  0.3  0.3   Alkaline Phos 38 - 126 U/L 72  58  71   AST 15 - 41 U/L 27  30  32   ALT 0 - 44 U/L 22  45  46       RADIOGRAPHIC STUDIES: I have personally reviewed the radiological images as listed and agreed with the findings in the report. No results found.    No orders of the defined types were placed in this encounter.  All questions were answered. The patient knows to call the clinic with any problems, questions or concerns. No barriers to learning was detected. The total time spent in the appointment was 15 minutes.     Malachy Mood, MD 12/12/2022   Carolin Coy am acting as scribe for Malachy Mood, MD.   I have reviewed the above documentation for accuracy and completeness, and I agree with the above.

## 2023-12-24 ENCOUNTER — Inpatient Hospital Stay: Payer: Self-pay | Admitting: Nurse Practitioner

## 2023-12-24 ENCOUNTER — Other Ambulatory Visit: Payer: Self-pay | Admitting: Nurse Practitioner

## 2023-12-24 ENCOUNTER — Inpatient Hospital Stay: Payer: Self-pay

## 2023-12-24 ENCOUNTER — Telehealth: Payer: Self-pay | Admitting: Nurse Practitioner

## 2023-12-24 DIAGNOSIS — Z170 Estrogen receptor positive status [ER+]: Secondary | ICD-10-CM

## 2023-12-24 NOTE — Assessment & Plan Note (Deleted)
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 S/p bilateral mastectomies on 10/23/16 under Dr. Jenkins. Right breast path showed multifocal IDC, grade 1, 4.6 cm and 0.6 cm, and low grade DCIS. Left breast was benign. -oncotype recurrence score of 9 (low risk).  -Began tamoxifen around 02/2017. Tolerated moderately well with joint stiffness. She developed postmenopausal bleeding and underwent hysterectomy with BSO on 07/26/21. Tamoxifen discontinued at 11/19/21 visit.  -she declined AI -continue cancer surveillance  

## 2023-12-24 NOTE — Telephone Encounter (Signed)
 Scheduled appointments per incoming call. Talked with the patient and she is aware of the made appointments.

## 2023-12-24 NOTE — Progress Notes (Deleted)
 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 S/p bilateral mastectomies on 10/23/16 under Dr. Mavis. Right breast path showed multifocal IDC, grade 1, 4.6 cm and 0.6 cm, and low grade DCIS. Left breast was benign. -oncotype recurrence score of 9 (low risk).  -Began tamoxifen  around 02/2017. Tolerated moderately well with joint stiffness. She developed postmenopausal bleeding and underwent hysterectomy with BSO on 07/26/21. Tamoxifen  discontinued at 11/19/21 visit.  -she declined AI -continue cancer surveillance     The patient understands the plans discussed today and is in agreement with them.  She knows to contact our office if she develops concerns prior to her next appointment.  I provided *** minutes of face-to-face time during this encounter and > 50% was spent counseling as documented under my assessment and plan.    [name], NP  [facility] - A DEPT OF [facility] [address] [place] [address] Dept: [phone] Dept Fax: [phone]   No orders of the defined types were placed in this encounter.     CHIEF COMPLAINT:  CC: Right breast cancer, ER +  Current Treatment:  surveillance   INTERVAL HISTORY:  Susan Benton is here today for repeat clinical assessment. She had phone visit with Dr. Lanny on 12/12/2022. She denies fevers or chills. She denies pain. Her appetite is good. Her weight {Weight change:10426}.  I have reviewed the past medical history, past surgical history, social history and family history  with the patient and they are unchanged from previous note.  ALLERGIES:  is allergic to tramadol .  MEDICATIONS:  Current Outpatient Medications  Medication Sig Dispense Refill   allopurinol  (ZYLOPRIM ) 300 MG tablet Take 300 mg by mouth daily.     ALPRAZolam  (XANAX ) 0.5 MG tablet TAKE 1 TABLET BY MOUTH TWICE A DAY AS NEEDED FOR ANXIETY (Patient taking differently: Take 0.5 mg by mouth 2 (two) times daily.) 30 tablet 0   atenolol  (TENORMIN ) 100 MG tablet Take 1 tablet (100 mg total) by mouth daily. 30 tablet 1   cloNIDine  (CATAPRES ) 0.1 MG tablet Take 1 tablet (0.1 mg total) by mouth daily. 30 tablet 11   docusate sodium  (COLACE) 100 MG capsule TAKE 1 CAPSULE BY MOUTH 2 TIMES DAILY AS NEEDED. 30 capsule 2   gabapentin  (NEURONTIN ) 300 MG capsule TAKE ONE CAPSULE BY MOUTH 3 TIMES A DAY (Patient taking differently: Take 300 mg by mouth daily as needed (Nerve pain).) 90 capsule 2   olmesartan (BENICAR) 40 MG tablet Take 40 mg by mouth daily.     ondansetron  (ZOFRAN -ODT) 4 MG disintegrating tablet Take 1 tablet (4 mg total) by mouth every 6 (six) hours as needed for nausea. 20 tablet 0   oxyCODONE  (ROXICODONE ) 15 MG immediate release tablet Take 15 mg by mouth every 4 (four) hours as needed for pain.     Potassium Citrate  15 MEQ (1620 MG) TBCR Take 1 tablet by mouth 2 (two) times daily.     rOPINIRole  (REQUIP ) 2 MG tablet 1-2 tabs po qhs (Patient taking differently: Take 2 mg by mouth daily as  needed (Restless leg at bedtime).) 60 tablet 3   No current facility-administered medications for this visit.    HISTORY OF PRESENT ILLNESS:   Oncology History Overview Note   Cancer Staging  Malignant neoplasm of upper-outer quadrant of right female breast [facility]) Staging form: Breast, AJCC 8th Edition - Pathologic stage from 11/20/2016: Stage IA (pT2(m), pN0, cM0, G1, ER+, PR+, HER2-, Oncotype DX score: 9) - Signed by Letha Truman ORN, NP on 12/17/2016 Neoadjuvant therapy: No Multigene prognostic tests  performed: Oncotype DX Recurrence score range: Less than 11 Histologic grading system: 3 grade system Laterality: Right Multiple tumors: Yes Stage used in treatment planning: Yes National guidelines used in treatment planning: Yes Type of national guideline used in treatment planning: NCCN     Malignant neoplasm of upper-outer quadrant of right female breast (HCC)   Initial Diagnosis   Malignant neoplasm of upper-outer quadrant of right female breast (HCC)   10/08/2016 Initial Biopsy   (R) breast needle biopsy (10:00): Invasive ductal carcinoma, grade 1-2, with DCIS.  ER+ (100%), PR+ (100%), Ki67 10%, HER2 neg (ratio 1.03).    10/23/2016 Surgery   Bilateral mastectomies [facility])   10/23/2016 Pathology Results   RIGHT mastectomy: Multifocal IDC, grade 1, spanning 4.6 cm & 0.6 cm. Low grade DCIS. Negative margins. HER2 repeated and neg (ratio 1.36).  LEFT mastectomy: Fibroadenoma, no malignancy.    11/20/2016 Surgery   (R) axillary sentinel lymph node biopsy [facility])   11/20/2016 Pathology Results   0/1 axillary sentinel LN biopsy for carcinoma.    11/28/2016 Oncotype testing   Recurrence score: 9 (low risk); associated with 6% risk of distant recurrence.    12/03/2016 Imaging   CT chest: IMPRESSION: 1. 7.7 cm lobulated fluid attenuation collection in the right axilla, could represent a postoperative fluid collection, with or without presence of infection. Further management will need to be based on clinical grounds. Could correlate with targeted sonography and aspiration as indicated. 2. Clear lung fields 3. Hepatic steatosis 4. Aortic Atherosclerosis   12/13/2016 Miscellaneous   BCI genomic testing revealed LOW risk of recurrence with 4.3% risk and LOW likelihood of benefit of extension of therapy beyond 5 years.     12/09/2022 Imaging    IMPRESSION: 1. No suspicious appearing pulmonary nodules or other findings to suggest metastatic disease to the chest. 2. Aortic  atherosclerosis, in addition to three-vessel coronary artery disease. Please note that although the presence of coronary artery calcium documents the presence of coronary artery disease, the severity of this disease and any potential stenosis cannot be assessed on this non-gated CT examination. Assessment for potential risk factor modification, dietary therapy or pharmacologic therapy may be warranted, if clinically indicated. 3. Multiple new healing nondisplaced fractures of the anterolateral right third through sixth ribs. 4. Hepatic steatosis.       REVIEW OF SYSTEMS:   Constitutional: Denies fevers, chills or abnormal weight loss Eyes: Denies blurriness of vision Ears, nose, mouth, throat, and face: Denies mucositis or sore throat Respiratory: Denies cough, dyspnea or wheezes Cardiovascular: Denies palpitation, chest discomfort or lower extremity swelling Gastrointestinal:  Denies nausea, heartburn or change in bowel habits Skin: Denies abnormal skin rashes Lymphatics: Denies new lymphadenopathy or easy bruising Neurological:Denies numbness, tingling or new weaknesses Behavioral/Psych: Mood is stable, no new changes  All other systems were reviewed with the patient and are negative.   VITALS:  Last menstrual period 01/21/2017.  Wt Readings from Last 3 Encounters:  03/05/22 217 lb 2.5 oz (98.5 kg)  02/20/22 213  lb 12.8 oz (97 kg)  02/07/22 208 lb 12.8 oz (94.7 kg)    There is no height or weight on file to calculate BMI.  Performance status (ECOG): {CHL ONC [id number]  PHYSICAL EXAM:   GENERAL:alert, no distress and comfortable SKIN: skin color, texture, turgor are normal, no rashes or significant lesions EYES: normal, Conjunctiva are pink and non-injected, sclera clear OROPHARYNX:no exudate, no erythema and lips, buccal mucosa, and tongue normal  NECK: supple, thyroid normal size, non-tender, without nodularity LYMPH:  no palpable lymphadenopathy in the  cervical, axillary or inguinal LUNGS: clear to auscultation and percussion with normal breathing effort HEART: regular rate & rhythm and no murmurs and no lower extremity edema ABDOMEN:abdomen soft, non-tender and normal bowel sounds Musculoskeletal:no cyanosis of digits and no clubbing  NEURO: alert & oriented x 3 with fluent speech, no focal motor/sensory deficits  LABORATORY DATA:  I have reviewed the data as listed    Component Value Date/Time   NA 137 02/20/2022 1406   K 4.3 02/20/2022 1406   CL 103 02/20/2022 1406   CO2 26 02/20/2022 1406   GLUCOSE 118 (H) 02/20/2022 1406   BUN 18 02/20/2022 1406   CREATININE 0.74 02/20/2022 1406   CREATININE 0.53 07/20/2015 0832   CALCIUM 9.3 02/20/2022 1406   PROT 7.5 02/20/2022 1406   ALBUMIN 4.1 02/20/2022 1406   AST 27 02/20/2022 1406   ALT 22 02/20/2022 1406   ALKPHOS 72 02/20/2022 1406   BILITOT 0.5 02/20/2022 1406   GFRNONAA >60 02/20/2022 1406   GFRNONAA >89 07/20/2015 0832   GFRAA >60 11/11/2018 1053   GFRAA >89 07/20/2015 0832    No results found for: SPEP, UPEP  Lab Results  Component Value Date   WBC 7.0 02/20/2022   NEUTROABS 4.2 02/20/2022   HGB 12.3 02/20/2022   HCT 36.8 02/20/2022   MCV 85.0 02/20/2022   PLT 337 02/20/2022      Chemistry      Component Value Date/Time   NA 137 02/20/2022 1406   K 4.3 02/20/2022 1406   CL 103 02/20/2022 1406   CO2 26 02/20/2022 1406   BUN 18 02/20/2022 1406   CREATININE 0.74 02/20/2022 1406   CREATININE 0.53 07/20/2015 0832      Component Value Date/Time   CALCIUM 9.3 02/20/2022 1406   ALKPHOS 72 02/20/2022 1406   AST 27 02/20/2022 1406   ALT 22 02/20/2022 1406   BILITOT 0.5 02/20/2022 1406       RADIOGRAPHIC STUDIES: I have personally reviewed the radiological images as listed and agreed with the findings in the report. No results found.

## 2024-01-01 NOTE — Progress Notes (Signed)
 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 S/p bilateral mastectomies on 10/23/16 under Dr. Mavis. Right breast path showed multifocal IDC, grade 1, 4.6 cm and 0.6 cm, and low grade DCIS. Left breast was benign. -oncotype recurrence score of 9 (low risk).  -Began tamoxifen  around 02/2017. Tolerated moderately well with joint stiffness. She developed postmenopausal bleeding and underwent hysterectomy with BSO on 07/26/21. Tamoxifen  discontinued at 11/19/21 visit.  -she declined AI -CT chest, abdomen, and pelvis with contrast along with nuclear medicine whole-body bone scan ordered as part of visit on 01/02/2024. -continue cancer surveillance with labs and follow-up in 1 year, sooner if needed.   New nodule cervical spine Patient reports recent discomfort at the base of her neck.  Has been going on for few weeks and gradually getting worse.  She rates her back to feel the tender area, and found to palpable nodule over the cervical vertebrae.  Pain is not severe but it is constant.  Nodule gradually getting larger.  With history of breast cancer of the right breast and prior cervical cancer, will order PET scan to evaluate for metastatic disease.  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 She is seeing orthopedics and is planning on surgery to have procedure with sciatic nerve to reduce pain. Pain has been much more severe recently. Prior bone scan did show slight increase in metabolic activity. Felt to be degenerative in nature, at the time, will get PET scan  for further evaluation due to increased pain, new cervical spinal pain, palpble nodule at the base of cervical spine, and fatigue.   Left lower lobe pulmonary nodule Noted on most recent CT chest ,abdomen, pelvis on 10/30/2021.  PET scan ordered today for further evaluation along with evaluation of severe pain in cervical and lumbar spine.  Liver mass CT CAP from 10/30/2021 showed 2.4 cm ill-defined area of decreased attenuation in the central liver at the junction of the left and right hepatic lobes.  PET scan ordered today for additional evaluation.  Plan Labs reviewed. - CBC and CMP are unremarkable. Due to new and worsening symptoms along with some unintentional weight loss, PET scan ordered to evaluate for rectal recurrence or metastatic disease.  Peer-to-peer consultation performed and PET scan was denied. - CT chest, abdomen, and pelvis with contrast along with medicine whole-body bone scan ordered and authorized for evaluation of cancer recurrence or evidence of metastatic disease. -- Discussed results with patient when they are available. Plan for labs and follow-up in 6 months, sooner if needed.  The patient understands the plans discussed today and is in agreement with them.  She knows to contact our office if she develops concerns prior to her next appointment.  I provided 30 minutes of face-to-face time during this encounter and > 50% was spent counseling as documented under my assessment and plan.    [name], NP  [place] MED ONC - A DEPT OF [facility] [address] [place] [address] Dept: [phone] Dept Fax: [phone]  Orders Placed This Encounter  Procedures   CT CHEST ABDOMEN PELVIS W CONTRAST    Standing Status:   Future    Expected Date:   01/22/2024    Expiration Date:   01/07/2025    If indicated for the ordered procedure, I authorize the administration of contrast media per Radiology  protocol:   Yes    Does the patient have a contrast media/X-ray dye allergy?:   No    Preferred imaging location?:   [facility]    If indicated for the ordered procedure, I authorize the administration of oral contrast media per Radiology protocol:   Yes   NM Bone Scan Whole Body    Standing Status:   Future    Expected Date:   01/22/2024    Expiration Date:   01/07/2025    If indicated for the ordered procedure, I authorize the administration of a radiopharmaceutical per Radiology protocol:   Yes    Is the patient pregnant?:   No    Preferred imaging location?:   [facility]      CHIEF COMPLAINT:  CC: right breast cancer, ER+  Current Treatment:  surveillance   INTERVAL HISTORY:  Susan Benton is here today for repeat clinical assessment. She last saw Dr. Lanny on 12/12/2023.  Patient reports severe lower back pain, radiating into both legs.  She is seeing orthopedics and has received epidural injections.  She is scheduled for surgery within the next month.  She is not has to lump in the lower aspect of her neck.  Tender to palpate.  She has had some shortness of breath especially with exertion.  Sometimes, she will monitor her oxygen levels and notices trouble maintaining oxygen saturation above 90%.  Currently, she is on Zepbound.  Started this prescription after gaining 30 pounds.  She has lost approximately 47 pounds since starting on this medication.  She denies chest pain or chest pressure.. She denies headaches or visual disturbances. She denies abdominal pain, nausea, vomiting, or changes in bowel or bladder habits.  She denies fevers or chills. Her appetite is good.   I have reviewed the past medical history, past surgical history, social history and family history with the patient and they are unchanged from previous note.  ALLERGIES:  is allergic to tramadol .  MEDICATIONS:  Current Outpatient Medications  Medication Sig Dispense Refill   allopurinol  (ZYLOPRIM ) 300 MG  tablet Take 300 mg by mouth daily.     ALPRAZolam  (XANAX ) 0.5 MG tablet TAKE 1 TABLET BY MOUTH TWICE A DAY AS NEEDED FOR ANXIETY (Patient taking differently: Take 0.5 mg by mouth 2 (two) times daily.) 30 tablet 0   atenolol  (TENORMIN ) 100 MG tablet Take 1 tablet (100 mg total) by mouth daily. 30 tablet 1   cloNIDine  (CATAPRES ) 0.1 MG tablet Take 1 tablet (0.1 mg total) by mouth daily. 30 tablet 11   docusate sodium  (COLACE) 100 MG capsule TAKE 1 CAPSULE BY MOUTH 2 TIMES DAILY AS NEEDED. 30 capsule 2   gabapentin  (NEURONTIN ) 300 MG capsule TAKE ONE CAPSULE BY MOUTH 3 TIMES A DAY (Patient taking differently: Take 300 mg by mouth daily as needed (Nerve pain).) 90 capsule 2   olmesartan (BENICAR) 40 MG tablet Take 40 mg by mouth daily.     ondansetron  (ZOFRAN -ODT) 4 MG disintegrating tablet Take 1 tablet (4 mg total) by mouth every 6 (six) hours as needed for nausea. 20 tablet 0   oxyCODONE  (ROXICODONE ) 15 MG immediate release tablet Take 15  mg by mouth every 4 (four) hours as needed for pain.     Potassium Citrate  15 MEQ (1620 MG) TBCR Take 1 tablet by mouth 2 (two) times daily.     rOPINIRole  (REQUIP ) 2 MG tablet 1-2 tabs po qhs (Patient taking differently: Take 2 mg by mouth daily as needed (Restless leg at bedtime).) 60 tablet 3   No current facility-administered medications for this visit.    HISTORY OF PRESENT ILLNESS:   Oncology History Overview Note   Cancer Staging  Malignant neoplasm of upper-outer quadrant of right female breast [facility]) Staging form: Breast, AJCC 8th Edition - Pathologic stage from 11/20/2016: Stage IA (pT2(m), pN0, cM0, G1, ER+, PR+, HER2-, Oncotype DX score: 9) - Signed by Letha Truman ORN, NP on 12/17/2016 Neoadjuvant therapy: No Multigene prognostic tests performed: Oncotype DX Recurrence score range: Less than 11 Histologic grading system: 3 grade system Laterality: Right Multiple tumors: Yes Stage used in treatment planning: Yes National guidelines used in  treatment planning: Yes Type of national guideline used in treatment planning: NCCN     Malignant neoplasm of upper-outer quadrant of right female breast (HCC)   Initial Diagnosis   Malignant neoplasm of upper-outer quadrant of right female breast (HCC)   10/08/2016 Initial Biopsy   (R) breast needle biopsy (10:00): Invasive ductal carcinoma, grade 1-2, with DCIS.  ER+ (100%), PR+ (100%), Ki67 10%, HER2 neg (ratio 1.03).    10/23/2016 Surgery   Bilateral mastectomies [facility])   10/23/2016 Pathology Results   RIGHT mastectomy: Multifocal IDC, grade 1, spanning 4.6 cm & 0.6 cm. Low grade DCIS. Negative margins. HER2 repeated and neg (ratio 1.36).  LEFT mastectomy: Fibroadenoma, no malignancy.    11/20/2016 Surgery   (R) axillary sentinel lymph node biopsy [facility])   11/20/2016 Pathology Results   0/1 axillary sentinel LN biopsy for carcinoma.    11/28/2016 Oncotype testing   Recurrence score: 9 (low risk); associated with 6% risk of distant recurrence.    12/03/2016 Imaging   CT chest: IMPRESSION: 1. 7.7 cm lobulated fluid attenuation collection in the right axilla, could represent a postoperative fluid collection, with or without presence of infection. Further management will need to be based on clinical grounds. Could correlate with targeted sonography and aspiration as indicated. 2. Clear lung fields 3. Hepatic steatosis 4. Aortic Atherosclerosis   12/13/2016 Miscellaneous   BCI genomic testing revealed LOW risk of recurrence with 4.3% risk and LOW likelihood of benefit of extension of therapy beyond 5 years.     12/09/2022 Imaging    IMPRESSION: 1. No suspicious appearing pulmonary nodules or other findings to suggest metastatic disease to the chest. 2. Aortic atherosclerosis, in addition to three-vessel coronary artery disease. Please note that although the presence of coronary artery calcium documents the presence of coronary artery disease, the severity of this disease  and any potential stenosis cannot be assessed on this non-gated CT examination. Assessment for potential risk factor modification, dietary therapy or pharmacologic therapy may be warranted, if clinically indicated. 3. Multiple new healing nondisplaced fractures of the anterolateral right third through sixth ribs. 4. Hepatic steatosis.       REVIEW OF SYSTEMS:   Constitutional: Denies fevers, chills or abnormal weight loss Eyes: Denies blurriness of vision Ears, nose, mouth, throat, and face: Denies mucositis or sore throat Respiratory: Denies cough, dyspnea or wheezes. Has noted some increased shortness of breath.  Cardiovascular: Denies palpitation, chest discomfort or lower extremity swelling Gastrointestinal:  Denies nausea, heartburn or change in bowel habits Skin:  Denies abnormal skin rashes Lymphatics: Denies new lymphadenopathy or easy bruising Neurological:Denies numbness, tingling or new weaknesses Behavioral/Psych: Mood is stable, no new changes  Musculoskeletal: Severe pain at the base of the neck and in the lower back. All other systems were reviewed with the patient and are negative.   VITALS:   Today's Vitals   01/02/24 1122  BP: 98/64  Pulse: 84  Resp: 17  Temp: 97.9 F (36.6 C)  TempSrc: Temporal  SpO2: 95%  Weight: 213 lb 1.6 oz (96.7 kg)   Body mass index is 31.47 kg/m.    Wt Readings from Last 3 Encounters:  01/02/24 213 lb 1.6 oz (96.7 kg)  03/05/22 217 lb 2.5 oz (98.5 kg)  02/20/22 213 lb 12.8 oz (97 kg)    Body mass index is 31.47 kg/m.  Performance status (ECOG): 1 - Symptomatic but completely ambulatory  PHYSICAL EXAM:   GENERAL:alert, no distress and comfortable SKIN: skin color, texture, turgor are normal, no rashes or significant lesions EYES: normal, Conjunctiva are pink and non-injected, sclera clear OROPHARYNX:no exudate, no erythema and lips, buccal mucosa, and tongue normal  NECK: supple, thyroid normal size, non-tender,  without nodularity LYMPH:  no palpable lymphadenopathy in the cervical, axillary or inguinal LUNGS: clear to auscultation and percussion with normal breathing effort HEART: regular rate & rhythm and no murmurs and no lower extremity edema ABDOMEN:abdomen soft, non-tender and normal bowel sounds Musculoskeletal:no cyanosis of digits and no clubbing.  There is palpable nodule at the base of her neck.  Tender to palpate.  Mildly limited ROM of the neck.  No redness or warmth associated with palpable nodule. NEURO: alert & oriented x 3 with fluent speech, no focal motor/sensory deficits BREAST: Intact implant of left breast.  No palpable lumps or masses along the chest wall.  There is no axillary lymphadenopathy on the left.  There is intact implant on the right side.  There is no palpable mass or lump in the chest wall.  There is no axillary lymphadenopathy on the right.  LABORATORY DATA:  I have reviewed the data as listed    Component Value Date/Time   NA 137 01/02/2024 1101   K 4.5 01/02/2024 1101   CL 101 01/02/2024 1101   CO2 30 01/02/2024 1101   GLUCOSE 107 (H) 01/02/2024 1101   BUN 26 (H) 01/02/2024 1101   CREATININE 1.19 (H) 01/02/2024 1101   CREATININE 0.53 07/20/2015 0832   CALCIUM 9.5 01/02/2024 1101   PROT 7.7 01/02/2024 1101   ALBUMIN 4.4 01/02/2024 1101   AST 18 01/02/2024 1101   ALT 16 01/02/2024 1101   ALKPHOS 86 01/02/2024 1101   BILITOT 0.4 01/02/2024 1101   GFRNONAA 55 (L) 01/02/2024 1101   GFRNONAA >89 07/20/2015 0832   GFRAA >60 11/11/2018 1053   GFRAA >89 07/20/2015 0832     Lab Results  Component Value Date   WBC 9.5 01/02/2024   NEUTROABS 5.4 01/02/2024   HGB 13.8 01/02/2024   HCT 41.8 01/02/2024   MCV 81.2 01/02/2024   PLT 372 01/02/2024

## 2024-01-01 NOTE — Assessment & Plan Note (Addendum)
 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 S/p bilateral mastectomies on 10/23/16 under Dr. Mavis. Right breast path showed multifocal IDC, grade 1, 4.6 cm and 0.6 cm, and low grade DCIS. Left breast was benign. -oncotype recurrence score of 9 (low risk).  -Began tamoxifen  around 02/2017. Tolerated moderately well with joint stiffness. She developed postmenopausal bleeding and underwent hysterectomy with BSO on 07/26/21. Tamoxifen  discontinued at 11/19/21 visit.  -she declined AI -CT chest, abdomen, and pelvis with contrast along with nuclear medicine whole-body bone scan ordered as part of visit on 01/02/2024. -continue cancer surveillance with labs and follow-up in 1 year, sooner if needed.

## 2024-01-02 ENCOUNTER — Inpatient Hospital Stay: Payer: Self-pay | Attending: Nurse Practitioner

## 2024-01-02 ENCOUNTER — Inpatient Hospital Stay (HOSPITAL_BASED_OUTPATIENT_CLINIC_OR_DEPARTMENT_OTHER): Payer: Self-pay | Admitting: Nurse Practitioner

## 2024-01-02 VITALS — BP 98/64 | HR 84 | Temp 97.9°F | Resp 17 | Wt 213.1 lb

## 2024-01-02 DIAGNOSIS — Z90722 Acquired absence of ovaries, bilateral: Secondary | ICD-10-CM | POA: Diagnosis not present

## 2024-01-02 DIAGNOSIS — M519 Unspecified thoracic, thoracolumbar and lumbosacral intervertebral disc disorder: Secondary | ICD-10-CM | POA: Insufficient documentation

## 2024-01-02 DIAGNOSIS — Z170 Estrogen receptor positive status [ER+]: Secondary | ICD-10-CM | POA: Insufficient documentation

## 2024-01-02 DIAGNOSIS — Z90710 Acquired absence of both cervix and uterus: Secondary | ICD-10-CM | POA: Diagnosis not present

## 2024-01-02 DIAGNOSIS — C50411 Malignant neoplasm of upper-outer quadrant of right female breast: Secondary | ICD-10-CM | POA: Diagnosis not present

## 2024-01-02 DIAGNOSIS — Z8541 Personal history of malignant neoplasm of cervix uteri: Secondary | ICD-10-CM | POA: Diagnosis not present

## 2024-01-02 DIAGNOSIS — Z9013 Acquired absence of bilateral breasts and nipples: Secondary | ICD-10-CM | POA: Insufficient documentation

## 2024-01-02 DIAGNOSIS — R222 Localized swelling, mass and lump, trunk: Secondary | ICD-10-CM | POA: Insufficient documentation

## 2024-01-02 DIAGNOSIS — M2550 Pain in unspecified joint: Secondary | ICD-10-CM | POA: Diagnosis not present

## 2024-01-02 DIAGNOSIS — R911 Solitary pulmonary nodule: Secondary | ICD-10-CM | POA: Insufficient documentation

## 2024-01-02 DIAGNOSIS — R160 Hepatomegaly, not elsewhere classified: Secondary | ICD-10-CM | POA: Diagnosis not present

## 2024-01-02 LAB — CBC WITH DIFFERENTIAL (CANCER CENTER ONLY)
Abs Immature Granulocytes: 0.03 K/uL (ref 0.00–0.07)
Basophils Absolute: 0.1 K/uL (ref 0.0–0.1)
Basophils Relative: 1 %
Eosinophils Absolute: 0.5 K/uL (ref 0.0–0.5)
Eosinophils Relative: 5 %
HCT: 41.8 % (ref 36.0–46.0)
Hemoglobin: 13.8 g/dL (ref 12.0–15.0)
Immature Granulocytes: 0 %
Lymphocytes Relative: 30 %
Lymphs Abs: 2.8 K/uL (ref 0.7–4.0)
MCH: 26.8 pg (ref 26.0–34.0)
MCHC: 33 g/dL (ref 30.0–36.0)
MCV: 81.2 fL (ref 80.0–100.0)
Monocytes Absolute: 0.6 K/uL (ref 0.1–1.0)
Monocytes Relative: 7 %
Neutro Abs: 5.4 K/uL (ref 1.7–7.7)
Neutrophils Relative %: 57 %
Platelet Count: 372 K/uL (ref 150–400)
RBC: 5.15 MIL/uL — ABNORMAL HIGH (ref 3.87–5.11)
RDW: 13.1 % (ref 11.5–15.5)
WBC Count: 9.5 K/uL (ref 4.0–10.5)
nRBC: 0 % (ref 0.0–0.2)

## 2024-01-02 LAB — CMP (CANCER CENTER ONLY)
ALT: 16 U/L (ref 0–44)
AST: 18 U/L (ref 15–41)
Albumin: 4.4 g/dL (ref 3.5–5.0)
Alkaline Phosphatase: 86 U/L (ref 38–126)
Anion gap: 6 (ref 5–15)
BUN: 26 mg/dL — ABNORMAL HIGH (ref 6–20)
CO2: 30 mmol/L (ref 22–32)
Calcium: 9.5 mg/dL (ref 8.9–10.3)
Chloride: 101 mmol/L (ref 98–111)
Creatinine: 1.19 mg/dL — ABNORMAL HIGH (ref 0.44–1.00)
GFR, Estimated: 55 mL/min — ABNORMAL LOW (ref >60)
Glucose, Bld: 107 mg/dL — ABNORMAL HIGH (ref 70–99)
Potassium: 4.5 mmol/L (ref 3.5–5.1)
Sodium: 137 mmol/L (ref 135–145)
Total Bilirubin: 0.4 mg/dL (ref 0.0–1.2)
Total Protein: 7.7 g/dL (ref 6.5–8.1)

## 2024-01-02 NOTE — Progress Notes (Signed)
 Message sent to Devere Manna, LCSW

## 2024-01-05 ENCOUNTER — Telehealth: Payer: Self-pay | Admitting: Nurse Practitioner

## 2024-01-05 ENCOUNTER — Encounter (HOSPITAL_COMMUNITY): Payer: Self-pay

## 2024-01-05 NOTE — Telephone Encounter (Signed)
 Attempted to scheduled the patient for labs and a follow up in 1 year per 8/08 LOS notes. The patient insisted on calling back to schedule appointments after the PET scan is preformed and has resulted.

## 2024-01-06 ENCOUNTER — Encounter (HOSPITAL_COMMUNITY): Admission: RE | Admit: 2024-01-06 | Source: Ambulatory Visit

## 2024-01-09 ENCOUNTER — Inpatient Hospital Stay: Admitting: Licensed Clinical Social Worker

## 2024-01-09 DIAGNOSIS — Z170 Estrogen receptor positive status [ER+]: Secondary | ICD-10-CM

## 2024-01-09 NOTE — Progress Notes (Signed)
 [facility] CSW Progress Note  Clinical Child psychotherapist contacted patient by phone to follow-up on emotional support.    Interventions: CSW received a message to contact pt regarding possible counseling options.   Pt reports she has an appointment booked with a counselor.  Contact details for CSW provided to pt should she need additional assistance.       Follow Up Plan:  Patient will contact CSW with any support or resource needs    Devere JONELLE Manna, LCSW Clinical Social Worker [facility]

## 2024-01-11 ENCOUNTER — Encounter: Payer: Self-pay | Admitting: Nurse Practitioner

## 2024-01-15 ENCOUNTER — Ambulatory Visit (HOSPITAL_COMMUNITY)
Admission: RE | Admit: 2024-01-15 | Discharge: 2024-01-15 | Disposition: A | Discharge status: Home / Self Care | Source: Ambulatory Visit | Attending: Nurse Practitioner | Admitting: Nurse Practitioner

## 2024-01-15 ENCOUNTER — Ambulatory Visit (HOSPITAL_COMMUNITY)
Admission: RE | Admit: 2024-01-15 | Discharge: 2024-01-15 | Discharge status: Home / Self Care | Source: Ambulatory Visit | Attending: Nurse Practitioner

## 2024-01-15 DIAGNOSIS — C50411 Malignant neoplasm of upper-outer quadrant of right female breast: Secondary | ICD-10-CM | POA: Diagnosis present

## 2024-01-15 DIAGNOSIS — Z170 Estrogen receptor positive status [ER+]: Secondary | ICD-10-CM | POA: Diagnosis present

## 2024-01-15 MED ORDER — IOHEXOL 300 MG/ML  SOLN
100.0000 mL | Freq: Once | INTRAMUSCULAR | Status: AC | PRN
  Administered 2024-01-15: 100 mL via INTRAVENOUS

## 2024-01-15 MED ORDER — TECHNETIUM TC 99M MEDRONATE IV KIT
21.2000 | PACK | Freq: Once | INTRAVENOUS | Status: AC
  Administered 2024-01-15: 21.2 via INTRAVENOUS

## 2024-01-16 ENCOUNTER — Encounter: Payer: Self-pay | Admitting: Nurse Practitioner

## 2024-01-19 ENCOUNTER — Other Ambulatory Visit: Payer: Self-pay

## 2024-01-19 ENCOUNTER — Telehealth: Payer: Self-pay | Admitting: Hematology

## 2024-01-19 ENCOUNTER — Telehealth: Payer: Self-pay

## 2024-01-19 NOTE — Telephone Encounter (Signed)
 Pt called requesting to speak with Dr. Lanny or her APPs regarding recent can results.  Pt is extremely anxious about her results of her scans completed on 01/15/2024.  [facility] was called last week 01/16/2024 to expedite the read on the pt's scan.  Scan is still not read today.  Stated this nurse will call [facility] again today to expedite the read but the pt insisted on calling [facility] herself after being advised not to.  Notified Dr. Lanny and Team of the pt's call.

## 2024-01-19 NOTE — Telephone Encounter (Signed)
 Called and spoke with patient  regarding  her telephone call appt.

## 2024-01-20 ENCOUNTER — Other Ambulatory Visit: Payer: Self-pay

## 2024-01-21 ENCOUNTER — Telehealth: Payer: Self-pay

## 2024-01-21 ENCOUNTER — Other Ambulatory Visit: Payer: Self-pay

## 2024-01-21 ENCOUNTER — Ambulatory Visit (HOSPITAL_BASED_OUTPATIENT_CLINIC_OR_DEPARTMENT_OTHER): Admitting: Hematology

## 2024-01-21 DIAGNOSIS — Z170 Estrogen receptor positive status [ER+]: Secondary | ICD-10-CM

## 2024-01-21 DIAGNOSIS — C50411 Malignant neoplasm of upper-outer quadrant of right female breast: Secondary | ICD-10-CM | POA: Diagnosis not present

## 2024-01-21 DIAGNOSIS — K760 Fatty (change of) liver, not elsewhere classified: Secondary | ICD-10-CM

## 2024-01-21 NOTE — Telephone Encounter (Signed)
 As per Dr. Lanny faxed referral to Dr. Belvie Just. Faxed over copy of last office note, labs, med list, demographics, copy of referral and copy of insurance card. Received confirmation of receipt.

## 2024-01-21 NOTE — Assessment & Plan Note (Signed)
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 S/p bilateral mastectomies on 10/23/16 under Dr. Jenkins. Right breast path showed multifocal IDC, grade 1, 4.6 cm and 0.6 cm, and low grade DCIS. Left breast was benign. -oncotype recurrence score of 9 (low risk).  -Began tamoxifen around 02/2017. Tolerated moderately well with joint stiffness. She developed postmenopausal bleeding and underwent hysterectomy with BSO on 07/26/21. Tamoxifen discontinued at 11/19/21 visit.  -she declined AI -continue cancer surveillance  

## 2024-01-21 NOTE — Progress Notes (Signed)
 [facility]   Telephone:([phone] Fax:([phone]) [phone]   Clinic Follow up Note   Patient Care Team: Shona Norleen PEDLAR, MD as PCP - General (Internal Medicine) Janit Lamar PARAS, MD as Consulting Physician (Urology) Lanny Callander, MD as Consulting Physician (Hematology) 01/21/2024  I connected with Susan Benton on 01/21/24 at  9:00 AM EDT by telephone and verified that I am speaking with the correct person using two identifiers.   I discussed the limitations, risks, security and privacy concerns of performing an evaluation and management service by telephone and the availability of in person appointments. I also discussed with the patient that there may be a patient responsible charge related to this service. The patient expressed understanding and agreed to proceed.   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 S/p bilateral mastectomies on 10/23/16 under Dr. Mavis. Right breast path showed multifocal IDC, grade 1, 4.6 cm and 0.6 cm, and low grade DCIS. Left breast was benign. -oncotype recurrence score of 9 (low risk).  -Began tamoxifen  around 02/2017. Tolerated moderately well with joint stiffness. She developed postmenopausal bleeding and underwent hysterectomy with BSO on 07/26/21. Tamoxifen  discontinued at 11/19/21 visit.  -she declined AI -continue cancer surveillance   Assessment & Plan History of breast cancer, on surveillance CT scan shows no evidence of cancer recurrence. Bone scan results are pending.  - Follow up on bone scan results when available. - Continue breast cancer surveillance  Right upper quadrant abdominal pain, biliary duct dilatation Recent CT scan shows dilated bile duct. Right upper  quadrant pain is present but not requiring analgesics. Differential diagnosis includes possible stone or stricture causing obstruction. Liver function tests are normal, indicating no severe obstruction. - Refer to gastroenterology for further evaluation and possible procedure to investigate bile duct obstruction. - Advise her to contact the office if no appointment is scheduled by the end of the week.  Fatty liver (hepatic steatosis), stable on imaging Fatty liver noted on previous imaging. Current CT scan shows no changes in the liver. Liver function tests are normal.  Stable solitary pulmonary nodule, surveillance Previous lung nodules are stable with no new issues on current CT scan. No evidence of malignancy. She is a former smoker, quit three years ago.  Generalized anxiety disorder, exacerbation related to cancer surveillance Increased anxiety related to waiting for imaging results. History of Xanax  use with recent weaning off due to side effects. Anxiety described as severe, with symptoms of feeling anxious and crawling out of her skin.  Plan - I personally reviewed her CT scan images and discussed the findings with the patient.  No evidence of cancer recurrence on CT.  Her bone scan is still pending, we will call her with results. - Will refer to GI Dr. Arlis for her biliary ductal dilatation workup - Lab and follow-up in 6 months. -Bone scan was negative, we informed pt.      SUMMARY OF ONCOLOGIC HISTORY: Oncology History Overview Note   Cancer Staging  Malignant neoplasm of upper-outer quadrant of right female breast [facility]) Staging form: Breast, AJCC 8th Edition - Pathologic stage from 11/20/2016: Stage IA (pT2(m), pN0, cM0, G1, ER+, PR+, HER2-, Oncotype DX score: 9) - Signed by Letha Truman ORN, NP on 12/17/2016 Neoadjuvant therapy:  No Multigene prognostic tests performed: Oncotype DX Recurrence score range: Less than 11 Histologic grading system: 3 grade  system Laterality: Right Multiple tumors: Yes Stage used in treatment planning: Yes National guidelines used in treatment planning: Yes Type of national guideline used in treatment planning: NCCN     Malignant neoplasm of upper-outer quadrant of right female breast (HCC)   Initial Diagnosis   Malignant neoplasm of upper-outer quadrant of right female breast (HCC)   10/08/2016 Initial Biopsy   (R) breast needle biopsy (10:00): Invasive ductal carcinoma, grade 1-2, with DCIS.  ER+ (100%), PR+ (100%), Ki67 10%, HER2 neg (ratio 1.03).    10/23/2016 Surgery   Bilateral mastectomies Gavin)   10/23/2016 Pathology Results   RIGHT mastectomy: Multifocal IDC, grade 1, spanning 4.6 cm & 0.6 cm. Low grade DCIS. Negative margins. HER2 repeated and neg (ratio 1.36).  LEFT mastectomy: Fibroadenoma, no malignancy.    11/20/2016 Surgery   (R) axillary sentinel lymph node biopsy Gavin)   11/20/2016 Pathology Results   0/1 axillary sentinel LN biopsy for carcinoma.    11/28/2016 Oncotype testing   Recurrence score: 9 (low risk); associated with 6% risk of distant recurrence.    12/03/2016 Imaging   CT chest: IMPRESSION: 1. 7.7 cm lobulated fluid attenuation collection in the right axilla, could represent a postoperative fluid collection, with or without presence of infection. Further management will need to be based on clinical grounds. Could correlate with targeted sonography and aspiration as indicated. 2. Clear lung fields 3. Hepatic steatosis 4. Aortic Atherosclerosis   12/13/2016 Miscellaneous   BCI genomic testing revealed LOW risk of recurrence with 4.3% risk and LOW likelihood of benefit of extension of therapy beyond 5 years.     12/09/2022 Imaging    IMPRESSION: 1. No suspicious appearing pulmonary nodules or other findings to suggest metastatic disease to the chest. 2. Aortic atherosclerosis, in addition to three-vessel coronary artery disease. Please note that although the  presence of coronary artery calcium documents the presence of coronary artery disease, the severity of this disease and any potential stenosis cannot be assessed on this non-gated CT examination. Assessment for potential risk factor modification, dietary therapy or pharmacologic therapy may be warranted, if clinically indicated. 3. Multiple new healing nondisplaced fractures of the anterolateral right third through sixth ribs. 4. Hepatic steatosis.     Discussed the use of AI scribe software for clinical note transcription with the patient, who gave verbal consent to proceed.  History of Present Illness Susan Benton is a 53 year old female with breast cancer who presents for follow-up on imaging results.  She experiences significant anxiety related to her history of breast cancer, which has been exacerbated by her previous cancer diagnoses. Her anxiety has been particularly high over the past week.  A recent CT scan revealed a dilated bile duct. She experiences right upper abdominal pain, particularly when wearing non-stretchy jeans, and has lost weight from 249 pounds to 200 pounds, which has increased her awareness of the pain.  She has a history of smoking, having quit three years ago, and was previously informed of lung nodules. She is currently on Lipitor and another cholesterol medication, which has contributed to a significant decrease in her cholesterol levels.     REVIEW OF SYSTEMS:   Constitutional: Denies fevers, chills or abnormal weight loss Eyes: Denies blurriness of vision Ears, nose, mouth, throat, and face: Denies mucositis or sore throat Respiratory: Denies cough, dyspnea or wheezes Cardiovascular: Denies palpitation, chest discomfort or lower extremity  swelling Gastrointestinal:  Denies nausea, heartburn or change in bowel habits Skin: Denies abnormal skin rashes Lymphatics: Denies new lymphadenopathy or easy bruising Neurological:Denies numbness, tingling or  new weaknesses Behavioral/Psych: Mood is stable, no new changes  All other systems were reviewed with the patient and are negative.  MEDICAL HISTORY:  Past Medical History:  Diagnosis Date   Anxiety    Arthritis    Cervical ca (HCC) 06/2021   TAH   Depression 2013   Fibroids, submucosal 10/04/2016   Headache    Hep C w/o coma, chronic (HCC) 06/27/2014   had treatment    History of kidney stones    Hx of right and left heart catheterization 2014   had cath in [place] , no intervention   Hypertension 1987   Invasive ductal carcinoma of breast (HCC) 10/14/2016   Low iron    Mesenteric thrombosis (HCC)    after taking tamoxifan-on plavix  and asa   PONV (postoperative nausea and vomiting)    Restless legs syndrome     SURGICAL HISTORY: Past Surgical History:  Procedure Laterality Date   AXILLARY SENTINEL NODE BIOPSY Right 11/20/2016   Procedure: AXILLARY SENTINEL NODE BIOPSY;  Surgeon: Mavis Anes, MD;  Location: [facility];  Service: General;  Laterality: Right;  Sentinel Node Injection @ 10:30am   BLADDER REPAIR  2022   BREAST RECONSTRUCTION WITH PLACEMENT OF TISSUE EXPANDER AND ALLODERM Bilateral 12/25/2021   Procedure: BREAST RECONSTRUCTION WITH PLACEMENT OF TISSUE EXPANDER AND ALLODERM;  Surgeon: Arelia Filippo, MD;  Location: [facility];  Service: Plastics;  Laterality: Bilateral;   BREAST RECONSTRUCTION WITH PLACEMENT OF TISSUE EXPANDER AND FLEX HD (ACELLULAR HYDRATED DERMIS) Bilateral 12/25/2016   Procedure: BREAST RECONSTRUCTION WITH PLACEMENT OF TISSUE EXPANDER AND FLEX HD (ACELLULAR HYDRATED DERMIS);  Surgeon: Lowery Estefana RAMAN, DO;  Location: [facility];  Service: Plastics;  Laterality: Bilateral;   CARDIAC CATHETERIZATION     CYSTOSCOPY  07/26/2021   Procedure: CYSTOSCOPY;  Surgeon: Arloa Lamar SQUIBB, MD;  Location: [facility];  Service: Gynecology;;   DILITATION & CURRETTAGE/HYSTROSCOPY WITH NOVASURE ABLATION N/A 10/23/2016   Procedure:  DILATATION & CURETTAGE/HYSTEROSCOPY WITH NOVASURE ENDOMETRIAL ABLATION;  Surgeon: Jayne Vonn DEL, MD;  Location: [facility];  Service: Gynecology;  Laterality: N/A;   KIDNEY CYST REMOVAL     LAPAROSCOPY N/A 11/08/2021   Procedure: LAPAROSCOPY DIAGNOSTIC;  Surgeon: Connell Davies, MD;  Location: [facility];  Service: Gynecology;  Laterality: N/A;   MASTECTOMY MODIFIED RADICAL Right 10/23/2016   Procedure: MASTECTOMY MODIFIED RADICAL;  Surgeon: Mavis Anes, MD;  Location: [facility];  Service: General;  Laterality: Right;   REMOVAL OF BILATERAL TISSUE EXPANDERS WITH PLACEMENT OF BILATERAL BREAST IMPLANTS Bilateral 03/05/2022   Procedure: REMOVAL OF BILATERAL TISSUE EXPANDERS WITH PLACEMENT OF BILATERAL BREAST IMPLANTS;  Surgeon: Arelia Filippo, MD;  Location: [facility];  Service: Plastics;  Laterality: Bilateral;   REPAIR VAGINAL CUFF N/A 11/08/2021   Procedure: REPAIR VAGINAL CUFF;  Surgeon: Connell Davies, MD;  Location: [facility];  Service: Gynecology;  Laterality: N/A;   ROTATOR CUFF REPAIR     SIMPLE MASTECTOMY WITH AXILLARY SENTINEL NODE BIOPSY Left 10/23/2016   Procedure: SIMPLE MASTECTOMY;  Surgeon: Mavis Anes, MD;  Location: [facility];  Service: General;  Laterality: Left;   TISSUE EXPANDER PLACEMENT Bilateral 02/27/2017   Procedure: REMOVAL TISSUE EXPANDER;  Surgeon: Lowery Estefana RAMAN, DO;  Location: [facility];  Service: Plastics;  Laterality: Bilateral;   TONSILECTOMY, ADENOIDECTOMY, BILATERAL MYRINGOTOMY AND TUBES Bilateral 1984   TOTAL  LAPAROSCOPIC HYSTERECTOMY WITH BILATERAL SALPINGO OOPHORECTOMY Bilateral 07/26/2021   Procedure: TOTAL LAPAROSCOPIC HYSTERECTOMY WITH BILATERAL SALPINGO OOPHORECTOMY;  Surgeon: Arloa Lamar SQUIBB, MD;  Location: [facility];  Service: Gynecology;  Laterality: Bilateral;   TUBAL LIGATION  1998   2000    I have reviewed the social history and family history with the patient and they are unchanged from previous note.  ALLERGIES:  is allergic to  tramadol .  MEDICATIONS:  Current Outpatient Medications  Medication Sig Dispense Refill   allopurinol  (ZYLOPRIM ) 300 MG tablet Take 300 mg by mouth daily.     ALPRAZolam  (XANAX ) 0.5 MG tablet TAKE 1 TABLET BY MOUTH TWICE A DAY AS NEEDED FOR ANXIETY (Patient taking differently: Take 0.5 mg by mouth 2 (two) times daily.) 30 tablet 0   atenolol  (TENORMIN ) 100 MG tablet Take 1 tablet (100 mg total) by mouth daily. 30 tablet 1   cloNIDine  (CATAPRES ) 0.1 MG tablet Take 1 tablet (0.1 mg total) by mouth daily. 30 tablet 11   docusate sodium  (COLACE) 100 MG capsule TAKE 1 CAPSULE BY MOUTH 2 TIMES DAILY AS NEEDED. 30 capsule 2   gabapentin  (NEURONTIN ) 300 MG capsule TAKE ONE CAPSULE BY MOUTH 3 TIMES A DAY (Patient taking differently: Take 300 mg by mouth daily as needed (Nerve pain).) 90 capsule 2   olmesartan (BENICAR) 40 MG tablet Take 40 mg by mouth daily.     ondansetron  (ZOFRAN -ODT) 4 MG disintegrating tablet Take 1 tablet (4 mg total) by mouth every 6 (six) hours as needed for nausea. 20 tablet 0   oxyCODONE  (ROXICODONE ) 15 MG immediate release tablet Take 15 mg by mouth every 4 (four) hours as needed for pain.     Potassium Citrate  15 MEQ (1620 MG) TBCR Take 1 tablet by mouth 2 (two) times daily.     rOPINIRole  (REQUIP ) 2 MG tablet 1-2 tabs po qhs (Patient taking differently: Take 2 mg by mouth daily as needed (Restless leg at bedtime).) 60 tablet 3   No current facility-administered medications for this visit.    PHYSICAL EXAMINATION: Not performed   LABORATORY DATA:  I have reviewed the data as listed    Latest Ref Rng & Units 01/02/2024   11:01 AM 02/20/2022    2:06 PM 10/30/2021    1:33 PM  CBC  WBC 4.0 - 10.5 K/uL 9.5  7.0  10.4   Hemoglobin 12.0 - 15.0 g/dL 86.1  87.6  87.9   Hematocrit 36.0 - 46.0 % 41.8  36.8  35.0   Platelets 150 - 400 K/uL 372  337  314         Latest Ref Rng & Units 01/02/2024   11:01 AM 02/20/2022    2:06 PM 10/30/2021    1:33 PM  CMP  Glucose 70 - 99 mg/dL  892  881  853   BUN 6 - 20 mg/dL 26  18  17    Creatinine 0.44 - 1.00 mg/dL 8.80  9.25  9.19   Sodium 135 - 145 mmol/L 137  137  137   Potassium 3.5 - 5.1 mmol/L 4.5  4.3  3.9   Chloride 98 - 111 mmol/L 101  103  105   CO2 22 - 32 mmol/L 30  26  25    Calcium 8.9 - 10.3 mg/dL 9.5  9.3  9.3   Total Protein 6.5 - 8.1 g/dL 7.7  7.5  6.8   Total Bilirubin 0.0 - 1.2 mg/dL 0.4  0.5  0.3   Alkaline Phos 38 -  126 U/L 86  72  58   AST 15 - 41 U/L 18  27  30    ALT 0 - 44 U/L 16  22  45       RADIOGRAPHIC STUDIES: I have personally reviewed the radiological images as listed and agreed with the findings in the report. No results found.     I discussed the assessment and treatment plan with the patient. The patient was provided an opportunity to ask questions and all were answered. The patient agreed with the plan and demonstrated an understanding of the instructions.   The patient was advised to call back or seek an in-person evaluation if the symptoms worsen or if the condition fails to improve as anticipated.  I provided 25 minutes of non face-to-face telephone visit time during this encounter, including review of chart and various tests results, discussions about plan of care and coordination of care plan.    Onita Mattock, MD 01/21/24

## 2024-02-04 ENCOUNTER — Other Ambulatory Visit (HOSPITAL_COMMUNITY): Payer: Self-pay | Admitting: Gastroenterology

## 2024-02-04 DIAGNOSIS — R935 Abnormal findings on diagnostic imaging of other abdominal regions, including retroperitoneum: Secondary | ICD-10-CM

## 2024-02-10 ENCOUNTER — Encounter (HOSPITAL_COMMUNITY): Payer: Self-pay

## 2024-02-10 ENCOUNTER — Ambulatory Visit (HOSPITAL_COMMUNITY)

## 2024-02-15 ENCOUNTER — Ambulatory Visit (HOSPITAL_COMMUNITY)

## 2024-02-21 ENCOUNTER — Other Ambulatory Visit (HOSPITAL_COMMUNITY): Payer: Self-pay | Admitting: Gastroenterology

## 2024-02-21 DIAGNOSIS — R52 Pain, unspecified: Secondary | ICD-10-CM

## 2024-02-22 ENCOUNTER — Ambulatory Visit (HOSPITAL_COMMUNITY)
Admission: RE | Admit: 2024-02-22 | Discharge: 2024-02-22 | Disposition: A | Discharge status: Home / Self Care | Source: Ambulatory Visit | Attending: Gastroenterology | Admitting: Gastroenterology

## 2024-02-22 DIAGNOSIS — R52 Pain, unspecified: Secondary | ICD-10-CM | POA: Diagnosis present

## 2024-02-22 DIAGNOSIS — R935 Abnormal findings on diagnostic imaging of other abdominal regions, including retroperitoneum: Secondary | ICD-10-CM | POA: Diagnosis present

## 2024-02-22 MED ORDER — GADOBUTROL 1 MMOL/ML IV SOLN
9.0000 mL | Freq: Once | INTRAVENOUS | Status: AC | PRN
  Administered 2024-02-22: 9 mL via INTRAVENOUS
